# Patient Record
Sex: Female | Born: 1975 | Race: White | Hispanic: No | Smoking: Former smoker
Health system: Southern US, Community
[De-identification: ages and names within clinical notes are randomized; demographics above are authoritative.]

## PROBLEM LIST (undated history)

## (undated) DIAGNOSIS — F32A Depression, unspecified: Secondary | ICD-10-CM

## (undated) DIAGNOSIS — T8859XA Other complications of anesthesia, initial encounter: Secondary | ICD-10-CM

## (undated) DIAGNOSIS — J302 Other seasonal allergic rhinitis: Secondary | ICD-10-CM

## (undated) DIAGNOSIS — J45909 Unspecified asthma, uncomplicated: Secondary | ICD-10-CM

## (undated) DIAGNOSIS — T7840XA Allergy, unspecified, initial encounter: Secondary | ICD-10-CM

## (undated) DIAGNOSIS — Z8489 Family history of other specified conditions: Secondary | ICD-10-CM

## (undated) DIAGNOSIS — M1990 Unspecified osteoarthritis, unspecified site: Secondary | ICD-10-CM

## (undated) DIAGNOSIS — R519 Headache, unspecified: Secondary | ICD-10-CM

## (undated) DIAGNOSIS — F419 Anxiety disorder, unspecified: Secondary | ICD-10-CM

## (undated) DIAGNOSIS — F329 Major depressive disorder, single episode, unspecified: Secondary | ICD-10-CM

## (undated) DIAGNOSIS — G629 Polyneuropathy, unspecified: Secondary | ICD-10-CM

## (undated) HISTORY — PX: AUGMENTATION MAMMAPLASTY: SUR837

## (undated) HISTORY — PX: KNEE SURGERY: SHX244

## (undated) HISTORY — PX: ESOPHAGOGASTRODUODENOSCOPY: SHX1529

## (undated) HISTORY — DX: Unspecified osteoarthritis, unspecified site: M19.90

## (undated) HISTORY — DX: Depression, unspecified: F32.A

## (undated) HISTORY — DX: Allergy, unspecified, initial encounter: T78.40XA

## (undated) HISTORY — PX: BREAST SURGERY: SHX581

## (undated) HISTORY — DX: Major depressive disorder, single episode, unspecified: F32.9

## (undated) HISTORY — DX: Unspecified asthma, uncomplicated: J45.909

---

## 2004-04-10 ENCOUNTER — Ambulatory Visit (HOSPITAL_COMMUNITY): Admission: RE | Admit: 2004-04-10 | Discharge: 2004-04-10 | Payer: Self-pay | Admitting: Specialist

## 2004-04-17 ENCOUNTER — Encounter: Admission: RE | Admit: 2004-04-17 | Discharge: 2004-04-17 | Payer: Self-pay | Admitting: Specialist

## 2006-09-25 ENCOUNTER — Emergency Department (HOSPITAL_COMMUNITY): Admission: EM | Admit: 2006-09-25 | Discharge: 2006-09-25 | Payer: Self-pay | Admitting: Emergency Medicine

## 2008-12-21 ENCOUNTER — Inpatient Hospital Stay (HOSPITAL_COMMUNITY): Admission: AD | Admit: 2008-12-21 | Discharge: 2008-12-21 | Payer: Self-pay | Admitting: Obstetrics & Gynecology

## 2010-02-05 ENCOUNTER — Encounter: Admission: RE | Admit: 2010-02-05 | Discharge: 2010-03-07 | Payer: Self-pay | Admitting: Obstetrics and Gynecology

## 2010-02-05 ENCOUNTER — Ambulatory Visit: Admission: RE | Admit: 2010-02-05 | Discharge: 2010-02-05 | Payer: Self-pay | Admitting: Obstetrics and Gynecology

## 2010-02-08 ENCOUNTER — Ambulatory Visit: Admission: RE | Admit: 2010-02-08 | Discharge: 2010-02-08 | Payer: Self-pay | Admitting: Obstetrics and Gynecology

## 2010-10-07 LAB — URINALYSIS, ROUTINE W REFLEX MICROSCOPIC
Bilirubin Urine: NEGATIVE
Glucose, UA: NEGATIVE mg/dL
Ketones, ur: NEGATIVE mg/dL
Nitrite: NEGATIVE
Protein, ur: NEGATIVE mg/dL
Specific Gravity, Urine: 1.01 (ref 1.005–1.030)
Urobilinogen, UA: 0.2 mg/dL (ref 0.0–1.0)
pH: 7 (ref 5.0–8.0)

## 2010-10-07 LAB — CBC
HCT: 39 % (ref 36.0–46.0)
Hemoglobin: 13.8 g/dL (ref 12.0–15.0)
MCHC: 35.3 g/dL (ref 30.0–36.0)
MCV: 93.9 fL (ref 78.0–100.0)
Platelets: 199 10*3/uL (ref 150–400)
RBC: 4.15 MIL/uL (ref 3.87–5.11)
RDW: 12.6 % (ref 11.5–15.5)
WBC: 7 10*3/uL (ref 4.0–10.5)

## 2010-10-07 LAB — POCT PREGNANCY, URINE: Preg Test, Ur: POSITIVE

## 2010-10-07 LAB — WET PREP, GENITAL
Clue Cells Wet Prep HPF POC: NONE SEEN
Trich, Wet Prep: NONE SEEN
Yeast Wet Prep HPF POC: NONE SEEN

## 2010-10-07 LAB — URINE MICROSCOPIC-ADD ON

## 2010-10-07 LAB — GC/CHLAMYDIA PROBE AMP, GENITAL
Chlamydia, DNA Probe: NEGATIVE
GC Probe Amp, Genital: NEGATIVE

## 2010-10-07 LAB — ABO/RH: ABO/RH(D): O POS

## 2010-11-15 NOTE — Op Note (Signed)
NAMEGRETTA, SAMONS               ACCOUNT NO.:  [id number]   MEDICAL RECORD NO.:  [id number]          PATIENT TYPE:  AMB   LOCATION:  DAY                          FACILITY:  [facility]   PHYSICIAN:  Jene Every, M.D.    DATE OF BIRTH:  1976/03/27   DATE OF PROCEDURE:  04/10/2004  DATE OF DISCHARGE:                                 OPERATIVE REPORT   PREOPERATIVE DIAGNOSIS:  Post-traumatic chondromalacia, patellofemoral  joint, right knee.   POSTOPERATIVE DIAGNOSIS:  Post-traumatic chondromalacia, patellofemoral  joint, right knee.   PROCEDURE PERFORMED:  Right knee arthroscopy, chondroplasty of the patella  and femoral sulcus.   ANESTHESIA:  General.   BRIEF HISTORY/INDICATIONS:  This is a 35 year old female with post-traumatic  injury to the knee and persistent pain in the patellofemoral joint.  Operative intervention is indicated for diagnosis and treatment.  Risks and  benefits were discussed, including bleeding, infection, damage to  vasculature, no change in symptoms, worsening of symptoms, etc.   Obtaining the patient in the supine position after induction of the  appropriate general anesthesia and 1 gm of Kefzol, the right lower extremity  was prepped and draped in the usual sterile fashion.  A lateral parapatellar  portal and superomedial parapatellar portal was fashioned with a #11 blade.  Ingress cannula was atraumatically placed.  Irrigant was utilized to  insufflate the joint.  The camera was then inserted laterally.  Under direct  visualization, a medial parapatellar portal was fashioned with a #11 blade  after localization with an 18 gauge needle, sparing the medial meniscus.  Examination of the patellar pouch reveals some minor grade III changes of  the patella and of the femoral sulcus.  There was normal patellofemoral  tracking.  A 4 inch resector was introduced, utilized to perform a  chondroplasty to a stable base.  Examination of the medial compartment  revealed a normal femoral condyle, tibial plateau and meniscus, stable to  probe palpation without evidence of tear.  ACL and PCL were unremarkable.  The lateral compartment revealed a normal femoral condyle and tibial plateau  and meniscus stable to palpation without evidence of tear or chondral  defect.  The gutters were unremarkable.  Copious lavage of the knee.  Examined the entire knee with no evidence of further pathology to minimal  arthroscopic intervention.   Next, all instrumentation was removed.  Portals were closed with 4-0 nylon  simple interrupted sutures.  Marcaine 0.25% with epinephrine was infiltrated  into the joint.  The wound was dressed sterilely.  She was awakened without  difficulty and transported to the recovery room in satisfactory condition.  Patient tolerated the procedure well with no complications.     Trey Paula   JB/MEDQ  D:  04/10/2004  T:  04/10/2004  Job:  [id number]

## 2012-06-16 ENCOUNTER — Ambulatory Visit (INDEPENDENT_AMBULATORY_CARE_PROVIDER_SITE_OTHER): Payer: 59 | Admitting: Family Medicine

## 2012-06-16 VITALS — BP 118/74 | HR 91 | Temp 98.4°F | Resp 16 | Ht 66.0 in | Wt 140.0 lb

## 2012-06-16 DIAGNOSIS — F411 Generalized anxiety disorder: Secondary | ICD-10-CM

## 2012-06-16 DIAGNOSIS — F419 Anxiety disorder, unspecified: Secondary | ICD-10-CM

## 2012-06-16 MED ORDER — ALPRAZOLAM 0.25 MG PO TABS: 0.2500 mg | ORAL_TABLET | Freq: Two times a day (BID) | ORAL | Status: DC | PRN

## 2012-06-16 NOTE — Progress Notes (Signed)
[facility] [address] [place] [address] [phone]  Date:  06/16/2012   Name:  Ann Sampson   DOB:  07/30/1975   MRN:  [id number]  PCP:  No primary provider on file.    Chief Complaint: Anxiety   History of Present Illness:  Ann Sampson is a 36 y.o. very pleasant female patient who presents with the following:  Here as a new patient today.  She would like to receive medication for symptoms of depression and anxiety.  Her job is stressful- this is not new.  However, her father is also now ill- he started to have TIAs earlier this month but during his evaluation he was found to have stage 4 lung cancer.  He recently had surgery but is not doing very well She is having frequent crying spells, and feels sad.   She is sleeping well.   Appetite is ok.    She is generally healthy except for a history of IBS and GERD- however these issues are now stable and she is off meds.   She has also been off antidepressants for several years.  She took wellbutrin and something else a long time ago- however, she felt that the antidepressants made her feel out of it and she is not eager to use these again.    No SI or HI  She is married and has a 34 year old son.  Has a glass of wine at night frequently  There is no problem list on file for this patient.   Past Medical History  Diagnosis Date  . Allergy   . Depression   . Asthma   . Arthritis     Past Surgical History  Procedure Date  . Breast surgery   . Knee surgery     History  Substance Use Topics  . Smoking status: Never Smoker   . Smokeless tobacco: Never Used  . Alcohol Use: No    Family History  Problem Relation Age of Onset  . Asthma Son     Allergies  Allergen Reactions  . Bextra (Valdecoxib)   . Flagyl (Metronidazole)   . Vioxx (Rofecoxib)     Medication list has been reviewed and updated.  No current outpatient prescriptions on file prior to visit.    Review of  Systems:  As per HPI- otherwise negative.   Physical Examination: Filed Vitals:   06/16/12 1604  BP: 118/74  Pulse: 91  Temp: 98.4 F (36.9 C)  Resp: 16   Filed Vitals:   06/16/12 1604  Height: 5\' 6"  (1.676 m)  Weight: 140 lb (63.504 kg)   Body mass index is 22.60 kg/(m^2). Ideal Body Weight: Weight in (lb) to have BMI = 25: 154.6   GEN: WDWN, NAD, Non-toxic, A & O x 3 HEENT: Atraumatic, Normocephalic. Neck supple. No masses, No LAD. intermittently tearful during exam Ears and Nose: No external deformity. CV: RRR, No M/G/R. No JVD. No thrill. No extra heart sounds. PULM: CTA B, no wheezes, crackles, rhonchi. No retractions. No resp. distress. No accessory muscle use. EXTR: No c/c/e NEURO Normal gait.  PSYCH: Normally interactive. Conversant and appropriate, no hallucinations or pressured speech   Assessment and Plan: 1. Anxiety  ALPRAZolam (XANAX) 0.25 MG tablet   Gave a limited supply of alprazolam to use as needed.  She does not want to use an antidepressant at this time- we will try the xanax first.  However she does understand that if she  needs to use this medication regularly that adding an antidepressant will be the next step.  She will give me a call or email in the next couple of weeks with an update- Sooner if worse.   Avoid using xanax with alcohol or when driving.    Abbe Amsterdam, MD

## 2012-06-16 NOTE — Patient Instructions (Addendum)
Use the xanax as needed but be cautious of sedation. Please give me a call or email in a week or two and let me know how you are doing!

## 2013-08-18 ENCOUNTER — Encounter: Payer: Self-pay | Admitting: Family Medicine

## 2013-08-18 ENCOUNTER — Ambulatory Visit (INDEPENDENT_AMBULATORY_CARE_PROVIDER_SITE_OTHER): Payer: 59 | Admitting: Family Medicine

## 2013-08-18 VITALS — BP 110/60 | HR 88 | Temp 98.5°F | Resp 16 | Ht 64.5 in | Wt 164.0 lb

## 2013-08-18 DIAGNOSIS — L853 Xerosis cutis: Secondary | ICD-10-CM

## 2013-08-18 DIAGNOSIS — M79675 Pain in left toe(s): Secondary | ICD-10-CM

## 2013-08-18 DIAGNOSIS — L539 Erythematous condition, unspecified: Secondary | ICD-10-CM

## 2013-08-18 DIAGNOSIS — L259 Unspecified contact dermatitis, unspecified cause: Secondary | ICD-10-CM

## 2013-08-18 DIAGNOSIS — L258 Unspecified contact dermatitis due to other agents: Secondary | ICD-10-CM

## 2013-08-18 DIAGNOSIS — M79609 Pain in unspecified limb: Secondary | ICD-10-CM

## 2013-08-18 DIAGNOSIS — M79674 Pain in right toe(s): Secondary | ICD-10-CM

## 2013-08-18 DIAGNOSIS — L309 Dermatitis, unspecified: Secondary | ICD-10-CM

## 2013-08-18 LAB — COMPREHENSIVE METABOLIC PANEL
ALT: 13 U/L (ref 0–35)
AST: 19 U/L (ref 0–37)
Albumin: 4.8 g/dL (ref 3.5–5.2)
Alkaline Phosphatase: 38 U/L — ABNORMAL LOW (ref 39–117)
BUN: 8 mg/dL (ref 6–23)
CO2: 28 mEq/L (ref 19–32)
Calcium: 9.7 mg/dL (ref 8.4–10.5)
Chloride: 102 mEq/L (ref 96–112)
Creat: 0.74 mg/dL (ref 0.50–1.10)
Glucose, Bld: 98 mg/dL (ref 70–99)
Potassium: 4.4 mEq/L (ref 3.5–5.3)
Sodium: 142 mEq/L (ref 135–145)
Total Bilirubin: 0.6 mg/dL (ref 0.2–1.2)
Total Protein: 6.9 g/dL (ref 6.0–8.3)

## 2013-08-18 LAB — POCT CBC
Granulocyte percent: 57.2 %G (ref 37–80)
HCT, POC: 45.9 % (ref 37.7–47.9)
Hemoglobin: 14.7 g/dL (ref 12.2–16.2)
Lymph, poc: 1.4 (ref 0.6–3.4)
MCH, POC: 30.7 pg (ref 27–31.2)
MCHC: 32 g/dL (ref 31.8–35.4)
MCV: 95.9 fL (ref 80–97)
MID (cbc): 0.3 (ref 0–0.9)
MPV: 9.7 fL (ref 0–99.8)
POC Granulocyte: 2.2 (ref 2–6.9)
POC LYMPH PERCENT: 35.5 %L (ref 10–50)
POC MID %: 7.3 %M (ref 0–12)
Platelet Count, POC: 243 10*3/uL (ref 142–424)
RBC: 4.79 M/uL (ref 4.04–5.48)
RDW, POC: 13.2 %
WBC: 3.9 10*3/uL — AB (ref 4.6–10.2)

## 2013-08-18 LAB — RHEUMATOID FACTOR: Rheumatoid fact SerPl-aCnc: <10 IU/mL (ref <=14)

## 2013-08-18 LAB — TSH: TSH: 1.939 u[IU]/mL (ref 0.350–4.500)

## 2013-08-18 LAB — URIC ACID: Uric Acid, Serum: 4.8 mg/dL (ref 2.4–7.0)

## 2013-08-18 LAB — POCT SEDIMENTATION RATE: POCT SED RATE: 15 mm/hr (ref 0–22)

## 2013-08-18 MED ORDER — DICLOFENAC SODIUM 75 MG PO TBEC: 75.0000 mg | DELAYED_RELEASE_TABLET | Freq: Two times a day (BID) | ORAL | Status: DC

## 2013-08-18 MED ORDER — TRIAMCINOLONE ACETONIDE 0.1 % EX CREA: 1.0000 "application " | TOPICAL_CREAM | Freq: Two times a day (BID) | CUTANEOUS | Status: DC

## 2013-08-18 NOTE — Patient Instructions (Signed)
Bathe less frequently and use a moisturizing soap such as dove  Pat dry after bathing, and apply a small amount of mineral oil over entire body  Use triamcinolone cream on rash twice daily  Return if not improving.  Take diclofenac 75 twice daily for 2 weeks for inflammation of toes  Labs are pending.  If not improving next 2 weeks may need to refer to rheumatologist

## 2013-08-18 NOTE — Progress Notes (Signed)
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 For 6 months or so she has been having problems with redness and swelling and pain in her third toes of both feet. The right foot especially is bothering her. He gets very tender down at the tip of the toes. She has had no known trauma. She wears comfortable shoes to the best her ability.  She also has a history of some eczema in the past. She's been having a rash on her arms, primarily in the left arm at the antecubital fossa. It has gotten worse lately. Not much itching.  Objective: Very dry skin generally. Even though she used cream on her hands this morning there flaky looking. Her left arm just above and into the elbow at the antecubital fossa is erythematous, no crusting, a confluent but irregular bordered area. Small amount of similar rash on the right arm. None seen on her trunk. Both feet and weak pulses in the dorsalis pedis and posterior tibialis, but it is extremely cold this morning and her feet are very cold. The third toe of both feet is red and get the toe surrounding the nail on the dorsal surface of the DIP joint and distally. The right is especially tender to touch, the left a little bit. It has look okay except for being cold and slightly bluish this morning.  Assessment: Erythema and swelling of toes with toe pain bilaterally third toes Eczematoid dermatitis on arm Dry skin eczema  Plan: Check labs Triamcinolone cream twice a day on rash Diclofenac twice daily for toes. Labs are pending. She has some allergies, but can take ibuprofen and naproxen so I suspect she can take the diclofenac well but cautioned her to stop it immediately and the case will rash or problems

## 2013-08-19 ENCOUNTER — Encounter: Payer: Self-pay | Admitting: Family Medicine

## 2013-08-19 LAB — ANA: Anti Nuclear Antibody(ANA): NEGATIVE

## 2013-08-23 ENCOUNTER — Ambulatory Visit: Payer: 59

## 2013-08-23 ENCOUNTER — Ambulatory Visit (INDEPENDENT_AMBULATORY_CARE_PROVIDER_SITE_OTHER): Payer: 59 | Admitting: Family Medicine

## 2013-08-23 ENCOUNTER — Telehealth: Payer: Self-pay

## 2013-08-23 VITALS — BP 126/80 | HR 89 | Temp 98.4°F | Resp 16 | Ht 64.5 in | Wt 163.2 lb

## 2013-08-23 DIAGNOSIS — M545 Low back pain, unspecified: Secondary | ICD-10-CM

## 2013-08-23 DIAGNOSIS — R202 Paresthesia of skin: Secondary | ICD-10-CM

## 2013-08-23 DIAGNOSIS — R230 Cyanosis: Secondary | ICD-10-CM

## 2013-08-23 DIAGNOSIS — R200 Anesthesia of skin: Secondary | ICD-10-CM

## 2013-08-23 DIAGNOSIS — M79676 Pain in unspecified toe(s): Secondary | ICD-10-CM

## 2013-08-23 DIAGNOSIS — R209 Unspecified disturbances of skin sensation: Secondary | ICD-10-CM

## 2013-08-23 DIAGNOSIS — M7989 Other specified soft tissue disorders: Secondary | ICD-10-CM

## 2013-08-23 DIAGNOSIS — M79609 Pain in unspecified limb: Secondary | ICD-10-CM

## 2013-08-23 MED ORDER — GABAPENTIN 300 MG PO CAPS: ORAL_CAPSULE | ORAL | Status: DC

## 2013-08-23 NOTE — Patient Instructions (Signed)
Can start Neurontin as discussed. We will refer you to a rheumatologist, but if any worsening of your back pain or other pains prior - recheck with me or Dr. Alwyn RenHopper.  We will also schedule the evaluation of circulation in legs (ABI test) -and will call you about this.

## 2013-08-23 NOTE — Progress Notes (Addendum)
Subjective:    Patient ID: Ann Sampson, female    DOB: 05/31/1976, 38 y.o.   MRN: [id number] This chart was scribed for Meredith StaggersJeffrey Sharonda Llamas, MD  by Danella Maiersaroline Early, ED Scribe. This patient was seen in room 11 and the patient's care was started at 7:25 PM.  Chief Complaint  Patient presents with  . Back Pain    low back pain x2 days  . Numbness    and feels like pins sticking her on her toes.  was just here on the 19th for toe pain    HPI HPI Comments: Ann LarkRhonda M Sampson is a 38 y.o. female who [facility] to the [facility] complaining of lower back pain. Seen 5 days ago by Dr Alwyn RenHopper for reported redness and swelling of the toes with underlying eczema. Prescribed triamcinoline and diclofinac for the pain in her toes. Had a normal Cedrate of 15. TSH normal. Uric acid normal. ANA negative. Rheumatoid factor negative. CMP normal except borderline low AP.   Pt is complaining of intermittent, non-radiating lower back pain described as pressure onset 5 days ago that worsened in the last 2 days with numbness and discoloration in the toes. She states the pain worsened when her son jumped on her to give a hug. She reports several incidents where she almost fell walking down the stairs since she cannot feel her toes. She describes the numbness as pins and needles. She reports intermittent numbness in her fingers but no pain. She denies perioral numbness. She reports intermittent, worsening pain, redness, and swelling in her toes since May 2014. She states it is tender to touch and hurts to walk. She states it starts out as a small red dot on the toes that spreads to whole toes. She states she has been verry worried about her feet. She has not been sleeping well due to worry, worse in the last 2 days. She states the numbness does not keep her up at night but the pressure in her back sometimes wakes her up. She denies palpitations, sweats, fevers, chills, weakness, urinary or bowel incontinence,  genital numbness. She reports intermittent headaches lasting minutes at a time. She reports reduced swelling in the toes after taking the diclofinac.  PCP - No primary provider on file.   Results for orders placed in visit on 08/18/13  COMPREHENSIVE METABOLIC PANEL      Result Value Ref Range   Sodium 142  135 - 145 mEq/L   Potassium 4.4  3.5 - 5.3 mEq/L   Chloride 102  96 - 112 mEq/L   CO2 28  19 - 32 mEq/L   Glucose, Bld 98  70 - 99 mg/dL   BUN 8  6 - 23 mg/dL   Creat 4.090.74  8.110.50 - 9.141.10 mg/dL   Total Bilirubin 0.6  0.2 - 1.2 mg/dL   Alkaline Phosphatase 38 (*) 39 - 117 U/L   AST 19  0 - 37 U/L   ALT 13  0 - 35 U/L   Total Protein 6.9  6.0 - 8.3 g/dL   Albumin 4.8  3.5 - 5.2 g/dL   Calcium 9.7  8.4 - 78.210.5 mg/dL  TSH      Result Value Ref Range   TSH 1.939  0.350 - 4.500 uIU/mL  URIC ACID      Result Value Ref Range   Uric Acid, Serum 4.8  2.4 - 7.0 mg/dL  ANA      Result Value Ref Range  ANA NEG  NEGATIVE  RHEUMATOID FACTOR      Result Value Ref Range   Rheumatoid Factor <10  <=14 IU/mL  POCT CBC      Result Value Ref Range   WBC 3.9 (*) 4.6 - 10.2 K/uL   Lymph, poc 1.4  0.6 - 3.4   POC LYMPH PERCENT 35.5  10 - 50 %L   MID (cbc) 0.3  0 - 0.9   POC MID % 7.3  0 - 12 %M   POC Granulocyte 2.2  2 - 6.9   Granulocyte percent 57.2  37 - 80 %G   RBC 4.79  4.04 - 5.48 M/uL   Hemoglobin 14.7  12.2 - 16.2 g/dL   HCT, POC 16.1  09.6 - 47.9 %   MCV 95.9  80 - 97 fL   MCH, POC 30.7  27 - 31.2 pg   MCHC 32.0  31.8 - 35.4 g/dL   RDW, POC 04.5     Platelet Count, POC 243  142 - 424 K/uL   MPV 9.7  0 - 99.8 fL  POCT SEDIMENTATION RATE      Result Value Ref Range   POCT SED RATE 15  0 - 22 mm/hr    There are no active problems to display for this patient.  Past Medical History  Diagnosis Date  . Allergy   . Depression   . Asthma   . Arthritis    Past Surgical History  Procedure Laterality Date  . Breast surgery    . Knee surgery     Allergies  Allergen Reactions    . Bextra [Valdecoxib]   . Flagyl [Metronidazole]   . Prednisone     Anaphylaxis   . Vioxx [Rofecoxib]    Prior to Admission medications   Medication Sig Start Date End Date Taking? Authorizing Provider  diclofenac (VOLTAREN) 75 MG EC tablet Take 1 tablet (75 mg total) by mouth 2 (two) times daily. 08/18/13  Yes Peyton Najjar, MD  EPINEPHrine (EPI-PEN) 0.3 mg/0.3 mL DEVI Inject into the muscle once.   Yes Historical Provider, MD  Multiple Vitamins-Minerals (ADULT GUMMY PO) Take by mouth.   Yes Historical Provider, MD  ALPRAZolam (XANAX) 0.25 MG tablet Take 1 tablet (0.25 mg total) by mouth 2 (two) times daily as needed for anxiety. 06/16/12   Gwenlyn Found Copland, MD  triamcinolone cream (KENALOG) 0.1 % Apply 1 application topically 2 (two) times daily. 08/18/13   Peyton Najjar, MD   History  Substance Use Topics  . Smoking status: Never Smoker   . Smokeless tobacco: Never Used  . Alcohol Use: No      Review of Systems  Constitutional: Negative for fever, chills and diaphoresis.  Eyes: Negative for visual disturbance.  Cardiovascular: Negative for palpitations and leg swelling.  Genitourinary: Positive for vaginal pain.  Musculoskeletal: Positive for back pain.  Skin: Positive for color change (cyanosis of toes).  Neurological: Positive for numbness and headaches.       Objective:   Physical Exam  Nursing note and vitals reviewed. Constitutional: She is oriented to person, place, and time. She appears well-developed and well-nourished. No distress.  HENT:  Head: Normocephalic and atraumatic.  Eyes: EOM are normal.  Neck: Neck supple.  Cardiovascular: Normal rate.   Pulmonary/Chest: Effort normal. No respiratory distress.  Musculoskeletal: Normal range of motion.  DP pulse 1+ bilaterally. Right foot slight cyanosis of all distal toes, most notable in great toe and 3rd toe. Left foot slight  cyanosis to all toes, most notable in 4th toe. Cap refill less than 1 sec. Full ROM  described area of pain in the lower paraspinals but no focal tenderness. Able to heel and toe walk without difficulty.  Neurological: She is alert and oriented to person, place, and time.  Reflex Scores:      Patellar reflexes are 2+ on the right side and 2+ on the left side.      Achilles reflexes are 2+ on the right side and 2+ on the left side. Skin: Skin is warm and dry.  Psychiatric: She has a normal mood and affect. Her behavior is normal.    Filed Vitals:   08/23/13 1849  BP: 126/80  Pulse: 89  Temp: 98.4 F (36.9 C)  TempSrc: Oral  Resp: 16  Height: 5' 4.5" (1.638 m)  Weight: 163 lb 3.2 oz (74.027 kg)  SpO2: 98%    [facility] preliminary x-ray report read by Dr. Neva Seat:  LS spine: NAD.      Assessment & Plan:   Ann Sampson is a 37 y.o. female Low back pain - Plan: DG Lumbar Spine Complete, Ambulatory referral to Rheumatology  Paresthesia of both feet - Plan: DG Lumbar Spine Complete, Ankle brachial index, Ambulatory referral to Rheumatology, gabapentin (NEURONTIN) 300 MG capsule, DISCONTINUED: gabapentin (NEURONTIN) 300 MG capsule  Toe cyanosis - Plan: Ankle brachial index, Ambulatory referral to Rheumatology  Pain and swelling of toe - Plan: Ambulatory referral to Rheumatology, gabapentin (NEURONTIN) 300 MG capsule, DISCONTINUED: gabapentin (NEURONTIN) 300 MG capsule  Numbness and tingling in hands - Plan: Ambulatory referral to Rheumatology, gabapentin (NEURONTIN) 300 MG capsule, DISCONTINUED: gabapentin (NEURONTIN) 300 MG capsule  Initially toe color changes and dysesthesias, now with LBP and hand paraesthesias. Prior rheumatalogic testing wnl, but possible autoimmune with raynauds syndrome. Most recent back sx's may be simple MSK strain/pain, but XR obtained without focal findings. Trial of Neurontin QHS, then increase to BID after 1 week, refer to Rheumatology, and check ABI's with toe/foot sx's above. RTC after these tests, or sooner if new/worsening sx's.   Meds  ordered this encounter  Medications  . DISCONTD: gabapentin (NEURONTIN) 300 MG capsule    Sig: 1 cap by mouth each night for 1 week, then increase to 1 cap by mouth twice per day.    Dispense:  60 capsule    Refill:  0  . gabapentin (NEURONTIN) 300 MG capsule    Sig: 1 cap by mouth each night for 1 week, then increase to 1 cap by mouth twice per day.    Dispense:  60 capsule    Refill:  0   Patient Instructions  Can start Neurontin as discussed. We will refer you to a rheumatologist, but if any worsening of your back pain or other pains prior - recheck with me or Dr. Alwyn Ren.  We will also schedule the evaluation of circulation in legs (ABI test) -and will call you about this.         I personally performed the services described in this documentation, which was scribed in my presence. The recorded information has been reviewed and considered, and addended by me as needed.

## 2013-08-23 NOTE — Telephone Encounter (Signed)
PT called wanting to know if her lab results were back.  Said since her visit she has had back pain, numbness and tingling in in toes and hand(s).  I told her I would put in a message, but advised her to come back in to be seen.  [phone]

## 2013-08-25 NOTE — Telephone Encounter (Signed)
Pt came into office.

## 2013-09-22 ENCOUNTER — Other Ambulatory Visit: Payer: Self-pay | Admitting: Family Medicine

## 2013-09-22 DIAGNOSIS — R200 Anesthesia of skin: Secondary | ICD-10-CM

## 2013-09-22 DIAGNOSIS — R202 Paresthesia of skin: Secondary | ICD-10-CM

## 2013-09-22 DIAGNOSIS — M79676 Pain in unspecified toe(s): Secondary | ICD-10-CM

## 2013-09-22 DIAGNOSIS — M7989 Other specified soft tissue disorders: Secondary | ICD-10-CM

## 2013-09-22 NOTE — Telephone Encounter (Signed)
Numbness in hands has diminished.   Toe discoloration- resolved.    Not turning red anymore, pain subsided.  Would like a refill on Gabapentin .  Please advise

## 2013-09-22 NOTE — Telephone Encounter (Signed)
Please call pt and check status of her numbness in hands and discoloration in toes.  If this has persisted, and still with bruising or discoloration in toes - recheck to discuss some other possibilities. If darkening/bruised appearance of toes has persisted - there is rarely a condition called "blue toe syndrome" that can be due to cholesterol crystals/deposition in the tissue, or small clots in those areas, and would consider this if sx's have persisted. Let me know if she has questions and provide her my hours if needed for follow up. Thanks, [employer]

## 2013-09-23 MED ORDER — GABAPENTIN 300 MG PO CAPS: ORAL_CAPSULE | ORAL | Status: DC

## 2013-09-23 NOTE — Telephone Encounter (Signed)
[facility]. Sarah, did Dr Neva SeatGreene speak with you about the ABI test? I didn't call pt about the Rx because I didn't know where things stood w/the referral?

## 2013-09-23 NOTE — Telephone Encounter (Signed)
Pt went to specialist and they did not prescribe anything further for pain. Her numbness has subsided. She is making an appt with you for the first week of April. Can we refill the Gabapentin? I have pended medication. She did not feel that she needed to increase the dose to BID. She only takes one cap at night.   She also wants to check on a vascular referral she spoke to you about-ABI test for circulation in the legs. I see the message in the note but not a referral and was unsure what to order. Please advise and I will take care of.

## 2013-09-30 ENCOUNTER — Ambulatory Visit (INDEPENDENT_AMBULATORY_CARE_PROVIDER_SITE_OTHER): Payer: 59 | Admitting: Family Medicine

## 2013-09-30 VITALS — BP 102/70 | HR 81 | Temp 98.1°F | Resp 18 | Ht 64.5 in | Wt 158.0 lb

## 2013-09-30 DIAGNOSIS — M79609 Pain in unspecified limb: Secondary | ICD-10-CM

## 2013-09-30 DIAGNOSIS — R209 Unspecified disturbances of skin sensation: Secondary | ICD-10-CM

## 2013-09-30 DIAGNOSIS — M79674 Pain in right toe(s): Secondary | ICD-10-CM

## 2013-09-30 DIAGNOSIS — M79675 Pain in left toe(s): Principal | ICD-10-CM

## 2013-09-30 DIAGNOSIS — I75029 Atheroembolism of unspecified lower extremity: Secondary | ICD-10-CM

## 2013-09-30 DIAGNOSIS — L539 Erythematous condition, unspecified: Secondary | ICD-10-CM

## 2013-09-30 DIAGNOSIS — R208 Other disturbances of skin sensation: Secondary | ICD-10-CM

## 2013-09-30 DIAGNOSIS — M545 Low back pain, unspecified: Secondary | ICD-10-CM

## 2013-09-30 MED ORDER — DICLOFENAC SODIUM 75 MG PO TBEC: 75.0000 mg | DELAYED_RELEASE_TABLET | Freq: Two times a day (BID) | ORAL | Status: DC

## 2013-09-30 NOTE — Progress Notes (Addendum)
Subjective:  This chart was scribed for Shade Flood, MD by Leone Payor, ED Scribe. This patient was seen in room 12 and the patient's care was started 8:18 AM.    Patient ID: Ann Sampson, female    DOB: 31-Oct-1975, 38 y.o.   MRN: [id number]  HPI  HPI Comments: Last office visit was Feb 24th. She was treated for low back pain, paresthesias in the feet and hands, and noted to have discoloration and swelling of toes. Referred to rheumatology but also started on Neurontin 300 mg QHS. Asked to return for follow up.   Ann Sampson is a 38 y.o. female who presents to [facility] complaining of gradually improving paresthesias to the bilateral hands and feet for the past 1 month. She states her low back pain is gradually improving with the prescribed medications. She was seen by rheumatologist who recommends for patient to have an ABI. She continues to have occasional discoloration to some of her toes. She denies weakness. She denies family history of hypercholesterolemia. She denies any new symptoms such as HA.     There are no active problems to display for this patient.  Past Medical History  Diagnosis Date  . Allergy   . Depression   . Asthma   . Arthritis    Past Surgical History  Procedure Laterality Date  . Breast surgery    . Knee surgery     Allergies  Allergen Reactions  . Bextra [Valdecoxib]   . Flagyl [Metronidazole]   . Prednisone     Anaphylaxis   . Vioxx [Rofecoxib]    Prior to Admission medications   Medication Sig Start Date End Date Taking? Authorizing Provider  ALPRAZolam (XANAX) 0.25 MG tablet Take 1 tablet (0.25 mg total) by mouth 2 (two) times daily as needed for anxiety. 06/16/12  Yes Gwenlyn Found Copland, MD  Ascorbic Acid (VITAMIN C) 100 MG tablet Take 100 mg by mouth daily.   Yes Historical Provider, MD  EPINEPHrine (EPI-PEN) 0.3 mg/0.3 mL DEVI Inject into the muscle once.   Yes Historical Provider, MD  gabapentin (NEURONTIN) 300 MG capsule 1 cap by  mouth each night for 1 week, then increase to 1 cap by mouth twice per day. 09/23/13  Yes Shade Flood, MD  Multiple Vitamins-Minerals (ADULT GUMMY PO) Take by mouth.   Yes Historical Provider, MD  diclofenac (VOLTAREN) 75 MG EC tablet Take 1 tablet (75 mg total) by mouth 2 (two) times daily. 08/18/13   Peyton Najjar, MD  triamcinolone cream (KENALOG) 0.1 % Apply 1 application topically 2 (two) times daily. 08/18/13   Peyton Najjar, MD   History   Social History  . Marital Status: Divorced    Spouse Name: N/A    Number of Children: N/A  . Years of Education: N/A   Occupational History  . Not on file.   Social History Main Topics  . Smoking status: Never Smoker   . Smokeless tobacco: Never Used  . Alcohol Use: No  . Drug Use: No  . Sexual Activity: Not on file   Other Topics Concern  . Not on file   Social History Narrative  . No narrative on file      Review of Systems  Skin: Positive for color change.  Neurological: Negative for weakness and numbness.       Objective:   Physical Exam  Nursing note and vitals reviewed. Constitutional: She is oriented to person, place, and time. She appears well-developed  and well-nourished.  HENT:  Head: Normocephalic and atraumatic.  Eyes: Conjunctivae and EOM are normal. Pupils are equal, round, and reactive to light.  No eyelid lesions or deposits.    Neck: Carotid bruit is not present.  Cardiovascular: Normal rate, regular rhythm, normal heart sounds and intact distal pulses.   Pulses:      Dorsalis pedis pulses are 2+ on the right side, and 2+ on the left side.  Pulmonary/Chest: Effort normal and breath sounds normal. No respiratory distress.  Abdominal: Soft. She exhibits no distension and no pulsatile midline mass. There is no tenderness.  Musculoskeletal: Normal range of motion. She exhibits no edema.  Right 3rd toe has a slight ecchymotic appearance, distally. Same temperature as toe next to it. Cap refill is < 1  seconds. Minimally darkened appearance of the right great toe. Same temperature as the toe next to it. Cap refill < 1 seconds.   Left foot: slightly dark appearance of the left 3rd toe. Same temp as toe next to it. Cap refill is < 1 seconds.   No other areas of cyanosis on feet. No petechiae.  Bilateral hands without any discoloration.   Minimal tenderness to the L-S spine with full ROM.    Neurological: She is alert and oriented to person, place, and time.  Normal sensation distally in the toes.  Skin: Skin is warm and dry. No erythema.  Psychiatric: She has a normal mood and affect. Her behavior is normal.    Filed Vitals:   09/30/13 0758  BP: 102/70  Pulse: 81  Temp: 98.1 F (36.7 C)  TempSrc: Oral  Resp: 18  Height: 5' 4.5" (1.638 m)  Weight: 158 lb (71.668 kg)  SpO2: 98%        Assessment & Plan:   Ann Sampson is a 38 y.o. female Toe pain, bilateral - Plan: Ambulatory referral to Vascular Surgery, diclofenac (VOLTAREN) 75 MG EC tablet  Erythema of toe  Blue toe syndrome - Plan: Ambulatory referral to Vascular Surgery  Dysesthesia  Low back pain   Discoloration of toes - differential includes Raynaud's vs less likely Blue Toe Syndrome. Has had evaluation with Rheumatology and apparently by testing there did not appear to be autoimmune disease. Has had some improvement with warmer weather indicating more likely Raynaud's but will refer to vascular surgery for eval.   Low back pain with paresthesias improved. Continue neurontin for now and recheck in next 6 weeks. Advised of any new neurological symptoms to let me know as may need neurology eval. Continue Voltaren BID PRN as tolerated prior and neurontin same dose.   Meds ordered this encounter  Medications  . Ascorbic Acid (VITAMIN C) 100 MG tablet    Sig: Take 100 mg by mouth daily.  . diclofenac (VOLTAREN) 75 MG EC tablet    Sig: Take 1 tablet (75 mg total) by mouth 2 (two) times daily.    Dispense:  30  tablet    Refill:  0   Patient Instructions  Ok to continue neurontin once per day, and voltaren if needed. Will refer you to vascular surgeon to discuss possible "blue toe syndrome" but " Raynauds" phenomenon still possible. Follow up with me in about 6 weeks to recheck - sooner if worse. (Return to the clinic or go to the nearest emergency room if any of your symptoms worsen or new symptoms occur).   Raynaud's Syndrome Raynaud's Syndrome is a disorder of the blood vessels in your hands and feet. It  occurs when small arteries of the arms/hands or legs/feet become sensitive to cold or emotional upset. This causes the arteries to constrict, or narrow, and reduces blood flow to the area. The color in the fingers or toes changes from white to bluish to red and this is not usually painful. There may be numbness and tingling. Sores on the skin (ulcers) can form. Symptoms are usually relieved by warming. HOME CARE INSTRUCTIONS   Avoid exposure to cold. Keep your whole body warm and dry. Dress in layers. Wear mittens or gloves when handling ice or frozen food and when outdoors. Use holders for glasses or cans containing cold drinks. If possible, stay indoors during cold weather.  Limit your use of caffeine. Switch to decaffeinated coffee, tea, and soda pop. Avoid chocolate.  Avoid smoking or being around cigarette smoke. Smoke will make symptoms worse.  Wear loose fitting socks and comfortable, roomy shoes.  Avoid vibrating tools and machinery.  If possible, avoid stressful and emotional situations. Exercise, meditation and yoga may help you cope with stress. Biofeedback may be useful.  Ask your caregiver about medicine (calcium channel blockers) that may control Raynaud's phenomena. SEEK MEDICAL CARE IF:   Your discomfort becomes worse, despite conservative treatment.  You develop sores on your fingers and toes that do not heal. Document Released: 06/13/2000 Document Revised: 09/08/2011  Document Reviewed: 06/20/2008 [facility] Patient Information 2014 [place], [place].

## 2013-09-30 NOTE — Patient Instructions (Signed)
Ok to continue neurontin once per day, and voltaren if needed. Will refer you to vascular surgeon to discuss possible "blue toe syndrome" but " Raynauds" phenomenon still possible. Follow up with me in about 6 weeks to recheck - sooner if worse. (Return to the clinic or go to the nearest emergency room if any of your symptoms worsen or new symptoms occur).   Raynaud's Syndrome Raynaud's Syndrome is a disorder of the blood vessels in your hands and feet. It occurs when small arteries of the arms/hands or legs/feet become sensitive to cold or emotional upset. This causes the arteries to constrict, or narrow, and reduces blood flow to the area. The color in the fingers or toes changes from white to bluish to red and this is not usually painful. There may be numbness and tingling. Sores on the skin (ulcers) can form. Symptoms are usually relieved by warming. HOME CARE INSTRUCTIONS   Avoid exposure to cold. Keep your whole body warm and dry. Dress in layers. Wear mittens or gloves when handling ice or frozen food and when outdoors. Use holders for glasses or cans containing cold drinks. If possible, stay indoors during cold weather.  Limit your use of caffeine. Switch to decaffeinated coffee, tea, and soda pop. Avoid chocolate.  Avoid smoking or being around cigarette smoke. Smoke will make symptoms worse.  Wear loose fitting socks and comfortable, roomy shoes.  Avoid vibrating tools and machinery.  If possible, avoid stressful and emotional situations. Exercise, meditation and yoga may help you cope with stress. Biofeedback may be useful.  Ask your caregiver about medicine (calcium channel blockers) that may control Raynaud's phenomena. SEEK MEDICAL CARE IF:   Your discomfort becomes worse, despite conservative treatment.  You develop sores on your fingers and toes that do not heal. Document Released: 06/13/2000 Document Revised: 09/08/2011 Document Reviewed: 06/20/2008 [facility]ExitCare Patient  Information 2014 [place]ExitCare, [place].

## 2013-10-13 NOTE — Telephone Encounter (Signed)
Dr. Neva SeatGreene- please advise on the ABI testing. Refill has been sent to the pharmacy.

## 2013-10-14 NOTE — Telephone Encounter (Signed)
Should be seeing vascular surgery - this procedure can be done at their office if needed. No specific order needed at this point. Let me know if there are further questions. Thanks, JG.

## 2014-05-23 ENCOUNTER — Other Ambulatory Visit: Payer: Self-pay | Admitting: Family Medicine

## 2014-07-10 ENCOUNTER — Ambulatory Visit (INDEPENDENT_AMBULATORY_CARE_PROVIDER_SITE_OTHER): Payer: 59 | Admitting: Family Medicine

## 2014-07-10 ENCOUNTER — Encounter: Payer: Self-pay | Admitting: Family Medicine

## 2014-07-10 VITALS — BP 126/78 | HR 92 | Temp 97.8°F | Resp 16 | Ht 65.0 in | Wt 164.0 lb

## 2014-07-10 DIAGNOSIS — Z91030 Bee allergy status: Secondary | ICD-10-CM

## 2014-07-10 DIAGNOSIS — F419 Anxiety disorder, unspecified: Secondary | ICD-10-CM

## 2014-07-10 DIAGNOSIS — R748 Abnormal levels of other serum enzymes: Secondary | ICD-10-CM

## 2014-07-10 DIAGNOSIS — D72819 Decreased white blood cell count, unspecified: Secondary | ICD-10-CM

## 2014-07-10 DIAGNOSIS — R230 Cyanosis: Secondary | ICD-10-CM

## 2014-07-10 DIAGNOSIS — Z91038 Other insect allergy status: Secondary | ICD-10-CM

## 2014-07-10 DIAGNOSIS — R0789 Other chest pain: Secondary | ICD-10-CM

## 2014-07-10 DIAGNOSIS — R208 Other disturbances of skin sensation: Secondary | ICD-10-CM

## 2014-07-10 LAB — COMPLETE METABOLIC PANEL WITH GFR
ALT: 17 U/L (ref 0–35)
AST: 22 U/L (ref 0–37)
Albumin: 4.6 g/dL (ref 3.5–5.2)
Alkaline Phosphatase: 45 U/L (ref 39–117)
BUN: 9 mg/dL (ref 6–23)
CO2: 30 mEq/L (ref 19–32)
Calcium: 10.1 mg/dL (ref 8.4–10.5)
Chloride: 100 mEq/L (ref 96–112)
Creat: 0.68 mg/dL (ref 0.50–1.10)
GFR, Est African American: >89 mL/min
GFR, Est Non African American: >89 mL/min
Glucose, Bld: 94 mg/dL (ref 70–99)
Potassium: 4.1 mEq/L (ref 3.5–5.3)
Sodium: 139 mEq/L (ref 135–145)
Total Bilirubin: 0.3 mg/dL (ref 0.2–1.2)
Total Protein: 7.3 g/dL (ref 6.0–8.3)

## 2014-07-10 LAB — CBC
HCT: 45.1 % (ref 36.0–46.0)
Hemoglobin: 15.3 g/dL — ABNORMAL HIGH (ref 12.0–15.0)
MCH: 30.4 pg (ref 26.0–34.0)
MCHC: 33.9 g/dL (ref 30.0–36.0)
MCV: 89.5 fL (ref 78.0–100.0)
MPV: 10.4 fL (ref 8.6–12.4)
Platelets: 285 10*3/uL (ref 150–400)
RBC: 5.04 MIL/uL (ref 3.87–5.11)
RDW: 12.9 % (ref 11.5–15.5)
WBC: 7 10*3/uL (ref 4.0–10.5)

## 2014-07-10 LAB — TSH: TSH: 1.643 u[IU]/mL (ref 0.350–4.500)

## 2014-07-10 MED ORDER — FLUOXETINE HCL 20 MG PO TABS: 20.0000 mg | ORAL_TABLET | Freq: Every day | ORAL | Status: DC

## 2014-07-10 MED ORDER — EPINEPHRINE 0.3 MG/0.3ML IJ SOAJ: 0.3000 mg | Freq: Once | INTRAMUSCULAR | Status: DC

## 2014-07-10 MED ORDER — ALPRAZOLAM 0.25 MG PO TABS: 0.2500 mg | ORAL_TABLET | Freq: Two times a day (BID) | ORAL | Status: DC | PRN

## 2014-07-10 NOTE — Patient Instructions (Addendum)
We are starting you on prozac which will be 10 mg or 1/2 tablet for 1 week, then you will increase to 38m.  We would like you to return in 4-6 weeks, so that we can see how the medication is doing and decide whether to increase the dosage.   We are also prescribing xanax .250mtablet which you can take twice daily as needed.  The prozac, an SSRI, takes about 4-6 weeks to take into effect.  Increase your exercise to no less than 5x/week.  Try to get in at least 30 minutes of aerobic movement, getting the heart pumping.   -Contact Dr. GrCarlota Raspberryf you decide that you would like counseling.      Stress and Stress Management Stress is a normal reaction to life events. It is what you feel when life demands more than you are used to or more than you can handle. Some stress can be useful. For example, the stress reaction can help you catch the last bus of the day, study for a test, or meet a deadline at work. But stress that occurs too often or for too long can cause problems. It can affect your emotional health and interfere with relationships and normal daily activities. Too much stress can weaken your immune system and increase your risk for physical illness. If you already have a medical problem, stress can make it worse. CAUSES  All sorts of life events may cause stress. An event that causes stress for one person may not be stressful for another person. Major life events commonly cause stress. These may be positive or negative. Examples include losing your job, moving into a new home, getting married, having a baby, or losing a loved one. Less obvious life events may also cause stress, especially if they occur day after day or in combination. Examples include working long hours, driving in traffic, caring for children, being in debt, or being in a difficult relationship. SIGNS AND SYMPTOMS Stress may cause emotional symptoms including, the following:  Anxiety. This is feeling worried, afraid, on edge,  overwhelmed, or out of control.  Anger. This is feeling irritated or impatient.  Depression. This is feeling sad, down, helpless, or guilty.  Difficulty focusing, remembering, or making decisions. Stress may cause physical symptoms, including the following:   Aches and pains. These may affect your head, neck, back, stomach, or other areas of your body.  Tight muscles or clenched jaw.  Low energy or trouble sleeping. Stress may cause unhealthy behaviors, including the following:   Eating to feel better (overeating) or skipping meals.  Sleeping too little, too much, or both.  Working too much or putting off tasks (procrastination).  Smoking, drinking alcohol, or using drugs to feel better. DIAGNOSIS  Stress is diagnosed through an assessment by your health care provider. Your health care provider will ask questions about your symptoms and any stressful life events.Your health care provider will also ask about your medical history and may order blood tests or other tests. Certain medical conditions and medicine can cause physical symptoms similar to stress. Mental illness can cause emotional symptoms and unhealthy behaviors similar to stress. Your health care provider may refer you to a mental health professional for further evaluation.  TREATMENT  Stress management is the recommended treatment for stress.The goals of stress management are reducing stressful life events and coping with stress in healthy ways.  Techniques for reducing stressful life events include the following:  Stress identification. Self-monitor for stress and identify what  causes stress for you. These skills may help you to avoid some stressful events.  Time management. Set your priorities, keep a calendar of events, and learn to say "no." These tools can help you avoid making too many commitments. Techniques for coping with stress include the following:  Rethinking the problem. Try to think realistically about  stressful events rather than ignoring them or overreacting. Try to find the positives in a stressful situation rather than focusing on the negatives.  Exercise. Physical exercise can release both physical and emotional tension. The key is to find a form of exercise you enjoy and do it regularly.  Relaxation techniques. These relax the body and mind. Examples include yoga, meditation, tai chi, biofeedback, deep breathing, progressive muscle relaxation, listening to music, being out in nature, journaling, and other hobbies. Again, the key is to find one or more that you enjoy and can do regularly.  Healthy lifestyle. Eat a balanced diet, get plenty of sleep, and do not smoke. Avoid using alcohol or drugs to relax.  Strong support network. Spend time with family, friends, or other people you enjoy being around.Express your feelings and talk things over with someone you trust. Counseling or talktherapy with a mental health professional may be helpful if you are having difficulty managing stress on your own. Medicine is typically not recommended for the treatment of stress.Talk to your health care provider if you think you need medicine for symptoms of stress. HOME CARE INSTRUCTIONS  Keep all follow-up visits as directed by your health care provider.  Take all medicines as directed by your health care provider. SEEK MEDICAL CARE IF:  Your symptoms get worse or you start having new symptoms.  You feel overwhelmed by your problems and can no longer manage them on your own. SEEK IMMEDIATE MEDICAL CARE IF:  You feel like hurting yourself or someone else. Document Released: 12/10/2000 Document Revised: 10/31/2013 Document Reviewed: 02/08/2013 [facility] Patient Information 2015 [place], [place]. This information is not intended to replace advice given to you by your health care provider. Make sure you discuss any questions you have with your health care provider.

## 2014-07-10 NOTE — Progress Notes (Signed)
MRN: [id number] DOB: October 11, 1975  Subjective:   ARWEN HASELEY is a 39 y.o. female presenting for ear pain, extremity cyanosis, and anxiety.  Ear pain: Patient reports that she was having intermittent throbbing ear about 2 weeks ago that lasted for about 2-3 weeks.  She reports no upper respiratory symptoms such as congestion, sore throat, coughing, or fever.  She denies any imbalance, tinnitus, or dizziness.  The symptoms spontaneously resolved.    Cyanotic extremities: At last visit, patient had concern of blue-tinged 1st great toe and upper extremity dysesthesia.  Patient reports no flare ups.  Patient has been observant of keeping extremities warm.  Patient had received a referral to rheumatology and vascular surgery, but never had appointment scheduled, despite attempt by referring provider.  The symptoms are only observant when she may reach for a cold object from the fridge, but it does not feel cold according to patient.  Patient denies extremity pain, joint swelling, erythema, or pain to joints or extremities.    Anxiety: Patient reports months of anxiety, primarily at the work place.  Patient reports that she will feel chest tightness at the office.  She denies any other anxiety feelings such as tremulousness, nausea, palpitations, heart racing, vision changes, tinnitus, choking or tingling sensation.  She states that the chest tightness occurs at rest without diaphoresis, n/v, palpitations, of sob.  It is relieved within minutes after deep breathing.  It occurs with stressful occurrences at work.  She states that there have been numerous layoffs, and understaffed.  She feels very overwhelmed at this point, and is contemplating quitting her job.  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  She denies depressive symptoms--there are no SI/HI.  She also denies any loss of interest, change in  appetite, sleep, energy, worthless or guilt.  She does have some agitation, as she feels a little short of patience at home.  Patient is currently no engaging in exercise.  She no longer takes the xanax from 2 years ago with the sickness and death of her father.  She states that she has tried Wellbutrin which did not help.  She also tried Zoloft which she reports headaches with this medication.      Mother has arthritis at neck and shoulders.    Shirlene has a current medication list which includes the following prescription(s): alprazolam, vitamin c, epinephrine, gabapentin, multiple vitamins-minerals, diclofenac, and triamcinolone cream.  She is allergic to bextra; flagyl; prednisone; and vioxx.  Shyna  has a past medical history of Allergy; Depression; Asthma; and Arthritis. Also  has past surgical history that includes Breast surgery and Knee surgery.  Family History  Problem Relation Age of Onset  . Asthma Son   -No hx of rheumatoid in family, however mother has arthritis of neck in sh  ROS As in subjective.  Objective:   Vitals: BP 126/78 mmHg  Pulse 92  Temp(Src) 97.8 F (36.6 C) (Oral)  Resp 16  Ht  (1.651 m)  Wt 164 lb (74.39 kg)  BMI 27.29 kg/m2  SpO2 97%  LMP 06/18/2014  Physical Exam  Constitutional: She is well-developed, well-nourished, and in no distress. No distress.  HENT:  Head: Normocephalic and atraumatic.  Eyes: Conjunctivae are normal. Pupils are equal, round, and reactive to light. Right eye exhibits no discharge. Left eye exhibits no discharge.  Neck: Normal range of motion. Neck supple. No  thyromegaly present.  Cardiovascular: Normal rate, regular rhythm, normal heart sounds and intact distal pulses.  Exam reveals no gallop and no friction rub.   No murmur heard. Pulses:      Dorsalis pedis pulses are 2+ on the right side, and 2+ on the left side.  Capillary refill <3 in all upper and lower extremities, though extremities are markedly cool.      Pulmonary/Chest: Effort normal and breath sounds normal. No respiratory distress. She has no wheezes.  Abdominal: Soft. Normal appearance and bowel sounds are normal. There is no tenderness.  Lymphadenopathy:    She has no cervical adenopathy.  Skin: Skin is warm and dry. No erythema.  Extremity dryness and scaling without erythema, hyperkeratosis, or plaques.    Psychiatric: Memory and judgment normal. Her affect is labile.     Assessment and Plan :  Patient is a 39 year old female is here today for follow up of cyanotic extremities and dysesthesia, and concern of ear pain and anxiety.    Anxiety - Plan: Alprazolam (XANAX) 0.25 MG tablet, Fluoxetine (PROZAC) 20 MG tablet -Wake hours are dominated by work life where anxiety is heightened.  Prescribed fluoxetine to manage general daily symptoms.  Also ordered xanax to aid in symptoms, especially while SSRI becomes more efficacious.  Will follow up in 4-6 weeks for symptoms.  Patient declines counseling at this time.    Allergy to bee sting - Plan: Epinephrine 0.3 mg/0.3 mL IJ SOAJ injection  Leukopenia Will recheck CBC  Elevated alkaline phosphatase level  -Ordered CMET  Chest tightness  -This appears more anxiety related without co-symptoms, abnormal exam or ekg.   -Ordered TSH and electrolytes to r/o  Dysesthesia Toe cyanosis -Stable with improvement, refer if symptoms resurface

## 2014-07-12 NOTE — Progress Notes (Deleted)
Patient discussed/examined with Ms. English. . Agree with assessment and plan of care per her note.   

## 2014-07-12 NOTE — Progress Notes (Signed)
Patient discussed/examined with Ms. English. Agree with assessment and plan of care per her note. EKG: SR, no acute findings.  Counseled on anxiety and stress management, will start prozac with hx of depression/anxiety, and recommended counseling, but declined at this point. rtc precautions discussed.

## 2014-08-04 ENCOUNTER — Other Ambulatory Visit: Payer: Self-pay | Admitting: Physician Assistant

## 2014-08-30 ENCOUNTER — Other Ambulatory Visit: Payer: Self-pay

## 2014-08-30 DIAGNOSIS — F419 Anxiety disorder, unspecified: Secondary | ICD-10-CM

## 2014-08-30 MED ORDER — FLUOXETINE HCL 20 MG PO TABS: 20.0000 mg | ORAL_TABLET | Freq: Every day | ORAL | Status: DC

## 2014-10-09 ENCOUNTER — Ambulatory Visit (INDEPENDENT_AMBULATORY_CARE_PROVIDER_SITE_OTHER): Payer: 59 | Admitting: Family Medicine

## 2014-10-09 ENCOUNTER — Encounter: Payer: Self-pay | Admitting: Family Medicine

## 2014-10-09 ENCOUNTER — Other Ambulatory Visit: Payer: Self-pay | Admitting: Physician Assistant

## 2014-10-09 VITALS — BP 134/77 | HR 75 | Temp 98.4°F | Resp 16 | Ht 64.5 in | Wt 164.0 lb

## 2014-10-09 DIAGNOSIS — F419 Anxiety disorder, unspecified: Secondary | ICD-10-CM

## 2014-10-09 MED ORDER — FLUOXETINE HCL 20 MG PO TABS: 20.0000 mg | ORAL_TABLET | Freq: Every day | ORAL | Status: DC

## 2014-10-09 NOTE — Progress Notes (Addendum)
Subjective:  This chart was scribed for [name], MD by [facility], medical scribe at [facility].The patient was seen in exam room 29 and the patient's care was started at 4:01 PM.   Patient ID: Ann Sampson, female    DOB: Nov 23, 1975, 39 y.o.   MRN: [id number] Chief Complaint  Patient presents with  . Medication Refill    prozac   HPI HPI Comments: Ann Sampson is a 39 y.o. female with a history of anxiety and depression who presents to [facility] for a medication refill. She needs her Prozac refilled.  Anxiety: Initially she was treated by Dr. Patsy Lager in 2013 and prescribed 0.25 mg Xanax She was last seen by here on 07/10/2014 by Judeth Cornfield English PA-C. She was having increased work place anxiety and chest tightness with these anxiety symptoms. Anxiety was improved with deep breathing but noticed primarily during stressful times at work. She felt overwhelmed with understaffed work place, numerous lay off and concern with job security. She denied depression. A trial of Prozac 20 mg and 0.25 mg to relieve anxiety. She had a normal TSH. Counseling was declined at that time.  Anxiety has improved since using Prozac. No side effects of her Prozac, work is unchanged but they have brought in new people and it will take about 6 months to relieve some of the stresses at work. She has not used her Xanax as much but uses it when she is feeling overwhelmed. Pt states she has not had trouble sleeping. Pt does not have a real "decompression time" at work. Mild exercise at work but none after work.  There are no active problems to display for this patient.  Past Medical History  Diagnosis Date  . Allergy   . Depression   . Asthma   . Arthritis    Past Surgical History  Procedure Laterality Date  . Breast surgery    . Knee surgery     Allergies  Allergen Reactions  . Bextra [Valdecoxib]   . Flagyl [Metronidazole]   . Prednisone    Anaphylaxis   . Vioxx [Rofecoxib]    Prior to Admission medications   Medication Sig Start Date End Date Taking? Authorizing Provider  ALPRAZolam (XANAX) 0.25 MG tablet Take 1 tablet (0.25 mg total) by mouth 2 (two) times daily as needed for anxiety. 07/10/14  Yes Stephanie D English, PA  Ascorbic Acid (VITAMIN C) 100 MG tablet Take 100 mg by mouth daily.   Yes Historical Provider, MD  diclofenac (VOLTAREN) 75 MG EC tablet Take 1 tablet (75 mg total) by mouth 2 (two) times daily. 09/30/13  Yes Shade Flood, MD  EPINEPHrine 0.3 mg/0.3 mL IJ SOAJ injection Inject 0.3 mLs (0.3 mg total) into the muscle once. 07/10/14  Yes Collie Siad English, PA  FLUoxetine (PROZAC) 20 MG tablet Take 1 tablet (20 mg total) by mouth daily. Take  1 tablet by mouth daily.PATIENT NEEDS OFFICE VISIT FOR ADDITIONAL REFILLS 08/30/14  Yes Collie Siad English, PA  gabapentin (NEURONTIN) 300 MG capsule TAKE 1 CAPSULE TWICE DAILY.   "OV NEEDED FOR ADDL REFILLS" 08/05/14  Yes Chelle S Jeffery, PA-C  Multiple Vitamins-Minerals (ADULT GUMMY PO) Take by mouth.   Yes Historical Provider, MD  triamcinolone cream (KENALOG) 0.1 % Apply 1 application topically 2 (two) times daily. Patient not taking: Reported on 07/10/2014 08/18/13   Peyton Najjar, MD   History   Social History  . Marital Status:  Divorced    Spouse Name: N/A  . Number of Children: N/A  . Years of Education: N/A   Occupational History  . Not on file.   Social History Main Topics  . Smoking status: Former Games developermoker  . Smokeless tobacco: Never Used  . Alcohol Use: No  . Drug Use: No  . Sexual Activity: Not on file   Other Topics Concern  . Not on file   Social History Narrative   Review of Systems  Respiratory: Negative for chest tightness.   Psychiatric/Behavioral: Negative for sleep disturbance. The patient is nervous/anxious.       Objective:  BP 134/77 mmHg  Pulse 75  Temp(Src) 98.4 F (36.9 C) (Oral)  Resp 16  Ht 5' 4.5" (1.638 m)  Wt 164 lb  (74.39 kg)  BMI 27.73 kg/m2  SpO2 100%  LMP 09/21/2014 Physical Exam  Constitutional: She is oriented to person, place, and time. She appears well-developed and well-nourished. No distress.  HENT:  Head: Normocephalic and atraumatic.  Eyes: Pupils are equal, round, and reactive to light.  Neck: Normal range of motion.  Cardiovascular: Normal rate and regular rhythm.   Pulmonary/Chest: Effort normal. No respiratory distress.  Musculoskeletal: Normal range of motion.  Neurological: She is alert and oriented to person, place, and time.  Skin: Skin is warm and dry.  Psychiatric: She has a normal mood and affect. Her behavior is normal.  Nursing note and vitals reviewed.     Assessment & Plan:   Ann LarkRhonda M Sampson is a 39 y.o. female Anxiety - Plan: FLUoxetine (PROZAC) 20 MG tablet  - stable. Controlled with current dose Prozac. Has xanax if needed for breakthrough symptoms. Discussed stress mgt strategies.  Recheck in next 6 months.  Consider trial off SSRI if continues to improve. rtc precautions.  Meds ordered this encounter  Medications  . FLUoxetine (PROZAC) 20 MG tablet    Sig: Take 1 tablet (20 mg total) by mouth daily. Take  1 tablet by mouth daily.    Dispense:  90 tablet    Refill:  1   Patient Instructions  No change in meds for now. recheck in 6 months. Let me know if there are any questions prior to that time. Have a good summer.      Claudette Stapler.jgs

## 2014-10-09 NOTE — Patient Instructions (Signed)
No change in meds for now. recheck in 6 months. Let me know if there are any questions prior to that time. Have a good summer.

## 2014-10-10 NOTE — Telephone Encounter (Signed)
Dr Neva SeatGreene, do you want to give pt RFs or have her come back in for re-check for this med?

## 2014-10-10 NOTE — Telephone Encounter (Signed)
We did not discuss this specifically at office visit yesterday. Please clarify how often she is taking this and is this still for symptoms in feet?  I do not mind refilling, but more info please. Thanks.

## 2014-10-11 NOTE — Telephone Encounter (Signed)
Dr Neva SeatGreene, I spoke to pt and she reported that she does still take this for the numbness and tingling in her hands and feet. She stated that she usually only takes it once a day and it seems to control it to the point that it does not interfere with her life. I have not changed the Rx sig or # because not sure if you would want to. She apologized that she didn't realize that she didn't have any refills left until the night after she saw you when she tried to call in for a refill.

## 2014-10-11 NOTE — Telephone Encounter (Signed)
Thanks. I refilled med for 6 months as stable.  We can discuss it further at follow up.

## 2014-10-12 NOTE — Telephone Encounter (Signed)
Notified pt RFs sent. 

## 2015-03-15 ENCOUNTER — Encounter: Payer: Self-pay | Admitting: Family Medicine

## 2015-03-15 ENCOUNTER — Ambulatory Visit (INDEPENDENT_AMBULATORY_CARE_PROVIDER_SITE_OTHER): Payer: 59 | Admitting: Family Medicine

## 2015-03-15 VITALS — BP 126/78 | HR 88 | Temp 98.9°F | Resp 16 | Ht 64.5 in | Wt 174.0 lb

## 2015-03-15 DIAGNOSIS — J069 Acute upper respiratory infection, unspecified: Secondary | ICD-10-CM | POA: Diagnosis not present

## 2015-03-15 DIAGNOSIS — H9202 Otalgia, left ear: Secondary | ICD-10-CM

## 2015-03-15 MED ORDER — AMOXICILLIN 875 MG PO TABS: 875.0000 mg | ORAL_TABLET | Freq: Two times a day (BID) | ORAL | Status: DC

## 2015-03-15 NOTE — Progress Notes (Signed)
Subjective:  This chart was scribed for [name], MD by [facility], medical scribe at [facility].The patient was seen in exam room 08 and the patient's care was started at 3:09 PM.   Patient ID: Ann Sampson, female    DOB: 01-13-1976, 39 y.o.   MRN: [id number] Chief Complaint  Patient presents with  . Sinusitis    x 4 days   . Cough  . Headache  . Flu Vaccine   HPI HPI Comments: Ann Sampson is a 39 y.o. female who presents to [facility] complaining of sore throat, rhinorrhea, a worsening headache, congestion, a more productive cough today. Fatigue today. Bilateral ear pressure. No drainage from the ear. Symptoms began four days ago. Tolerating fluids and solids. Alka seltzer plus and lozenges for relief. Sick contacts at work. No fever  There are no active problems to display for this patient.  Past Medical History  Diagnosis Date  . Allergy   . Depression   . Asthma   . Arthritis    Past Surgical History  Procedure Laterality Date  . Breast surgery    . Knee surgery     Allergies  Allergen Reactions  . Bextra [Valdecoxib]   . Flagyl [Metronidazole]   . Prednisone     Anaphylaxis   . Vioxx [Rofecoxib]    Prior to Admission medications   Medication Sig Start Date End Date Taking? Authorizing Provider  ALPRAZolam (XANAX) 0.25 MG tablet Take 1 tablet (0.25 mg total) by mouth 2 (two) times daily as needed for anxiety. 07/10/14  Yes Stephanie D English, PA  EPINEPHrine 0.3 mg/0.3 mL IJ SOAJ injection Inject 0.3 mLs (0.3 mg total) into the muscle once. 07/10/14  Yes Collie Siad English, PA  FLUoxetine (PROZAC) 20 MG tablet Take 1 tablet (20 mg total) by mouth daily. Take  1 tablet by mouth daily. 10/09/14  Yes Shade Flood, MD  gabapentin (NEURONTIN) 300 MG capsule Take 1 capsule (300 mg total) by mouth 2 (two) times daily as needed. 10/11/14  Yes Shade Flood, MD  Multiple Vitamins-Minerals (ADULT GUMMY PO) Take  by mouth.   Yes Historical Provider, MD  Ascorbic Acid (VITAMIN C) 100 MG tablet Take 100 mg by mouth daily.    Historical Provider, MD  diclofenac (VOLTAREN) 75 MG EC tablet Take 1 tablet (75 mg total) by mouth 2 (two) times daily. Patient not taking: Reported on 03/15/2015 09/30/13   Shade Flood, MD  triamcinolone cream (KENALOG) 0.1 % Apply 1 application topically 2 (two) times daily. Patient not taking: Reported on 07/10/2014 08/18/13   Peyton Najjar, MD   Social History   Social History  . Marital Status: Divorced    Spouse Name: N/A  . Number of Children: N/A  . Years of Education: N/A   Occupational History  . Not on file.   Social History Main Topics  . Smoking status: Former Games developer  . Smokeless tobacco: Never Used  . Alcohol Use: No  . Drug Use: No  . Sexual Activity: Not on file   Other Topics Concern  . Not on file   Social History Narrative   Review of Systems  Constitutional: Positive for fatigue. Negative for fever.  HENT: Positive for congestion, rhinorrhea, sinus pressure and sore throat.   Respiratory: Positive for cough.   Neurological: Positive for headaches.      Objective:  BP 126/78 mmHg  Pulse 88  Temp(Src) 98.9  F (37.2 C) (Oral)  Resp 16  Ht 5' 4.5" (1.638 m)  Wt 174 lb (78.926 kg)  BMI 29.42 kg/m2  SpO2 97%  LMP 02/22/2015 Physical Exam  Constitutional: She is oriented to person, place, and time. She appears well-developed and well-nourished. No distress.  HENT:  Head: Normocephalic and atraumatic.  Mouth/Throat: Oropharynx is clear and moist and mucous membranes are normal. No oropharyngeal exudate, posterior oropharyngeal edema or posterior oropharyngeal erythema.  Minimal erythema and injection of the left TM. Minimal clear fluid at the base of the left TM. Right TM is pearly grey. Sinuses are non-tender  Eyes: Pupils are equal, round, and reactive to light.  Neck: Normal range of motion.  Cardiovascular: Normal rate and regular  rhythm.   Pulmonary/Chest: Effort normal and breath sounds normal. No respiratory distress.  Musculoskeletal: Normal range of motion.  Lymphadenopathy:    She has no cervical adenopathy.  Neurological: She is alert and oriented to person, place, and time.  Skin: Skin is warm and dry.  Psychiatric: She has a normal mood and affect. Her behavior is normal.  Nursing note and vitals reviewed.     Assessment & Plan:   Ann Sampson is a 39 y.o. female Acute upper respiratory infection  Otalgia, left - Plan: amoxicillin (AMOXIL) 875 MG tablet  suspected viral upper respiratory infection. Symptomatic care discussed, fluids, rest , return to clinic precautions  - left otalgia, slight erythema, suspected viral cause, but if increased pain over the next few days,  Or fever, can fill amoxicillin for early otitis media. Side effects discussed.  RTC precautions  Meds ordered this encounter  Medications  . amoxicillin (AMOXIL) 875 MG tablet    Sig: Take 1 tablet (875 mg total) by mouth 2 (two) times daily.    Dispense:  20 tablet    Refill:  0   Patient Instructions  Saline nasal spray atleast 4 times per day, over the counter mucinex or mucinex DM, drink plenty of fluids and rest as needed. If ear pain not improving in next few days, or worsening - can start amoxicillin, bu doubt infection at this time.  Return to the clinic or go to the nearest emergency room if any of your symptoms worsen or new symptoms occur.   Upper Respiratory Infection, Adult An upper respiratory infection (URI) is also sometimes known as the common cold. The upper respiratory tract includes the nose, sinuses, throat, trachea, and bronchi. Bronchi are the airways leading to the lungs. Most people improve within 1 week, but symptoms can last up to 2 weeks. A residual cough may last even longer.  CAUSES Many different viruses can infect the tissues lining the upper respiratory tract. The tissues become irritated and  inflamed and often become very moist. Mucus production is also common. A cold is contagious. You can easily spread the virus to others by oral contact. This includes kissing, sharing a glass, coughing, or sneezing. Touching your mouth or nose and then touching a surface, which is then touched by another person, can also spread the virus. SYMPTOMS  Symptoms typically develop 1 to 3 days after you come in contact with a cold virus. Symptoms vary from person to person. They may include:  Runny nose.  Sneezing.  Nasal congestion.  Sinus irritation.  Sore throat.  Loss of voice (laryngitis).  Cough.  Fatigue.  Muscle aches.  Loss of appetite.  Headache.  Low-grade fever. DIAGNOSIS  You might diagnose your own cold based on familiar symptoms,  since most people get a cold 2 to 3 times a year. Your caregiver can confirm this based on your exam. Most importantly, your caregiver can check that your symptoms are not due to another disease such as strep throat, sinusitis, pneumonia, asthma, or epiglottitis. Blood tests, throat tests, and X-rays are not necessary to diagnose a common cold, but they may sometimes be helpful in excluding other more serious diseases. Your caregiver will decide if any further tests are required. RISKS AND COMPLICATIONS  You may be at risk for a more severe case of the common cold if you smoke cigarettes, have chronic heart disease (such as heart failure) or lung disease (such as asthma), or if you have a weakened immune system. The very young and very old are also at risk for more serious infections. Bacterial sinusitis, middle ear infections, and bacterial pneumonia can complicate the common cold. The common cold can worsen asthma and chronic obstructive pulmonary disease (COPD). Sometimes, these complications can require emergency medical care and may be life-threatening. PREVENTION  The best way to protect against getting a cold is to practice good hygiene. Avoid  oral or hand contact with people with cold symptoms. Wash your hands often if contact occurs. There is no clear evidence that vitamin C, vitamin E, echinacea, or exercise reduces the chance of developing a cold. However, it is always recommended to get plenty of rest and practice good nutrition. TREATMENT  Treatment is directed at relieving symptoms. There is no cure. Antibiotics are not effective, because the infection is caused by a virus, not by bacteria. Treatment may include:  Increased fluid intake. Sports drinks offer valuable electrolytes, sugars, and fluids.  Breathing heated mist or steam (vaporizer or shower).  Eating chicken soup or other clear broths, and maintaining good nutrition.  Getting plenty of rest.  Using gargles or lozenges for comfort.  Controlling fevers with ibuprofen or acetaminophen as directed by your caregiver.  Increasing usage of your inhaler if you have asthma. Zinc gel and zinc lozenges, taken in the first 24 hours of the common cold, can shorten the duration and lessen the severity of symptoms. Pain medicines may help with fever, muscle aches, and throat pain. A variety of non-prescription medicines are available to treat congestion and runny nose. Your caregiver can make recommendations and may suggest nasal or lung inhalers for other symptoms.  HOME CARE INSTRUCTIONS   Only take over-the-counter or prescription medicines for pain, discomfort, or fever as directed by your caregiver.  Use a warm mist humidifier or inhale steam from a shower to increase air moisture. This may keep secretions moist and make it easier to breathe.  Drink enough water and fluids to keep your urine clear or pale yellow.  Rest as needed.  Return to work when your temperature has returned to normal or as your caregiver advises. You may need to stay home longer to avoid infecting others. You can also use a face mask and careful hand washing to prevent spread of the virus. SEEK  MEDICAL CARE IF:   After the first few days, you feel you are getting worse rather than better.  You need your caregiver's advice about medicines to control symptoms.  You develop chills, worsening shortness of breath, or brown or red sputum. These may be signs of pneumonia.  You develop yellow or brown nasal discharge or pain in the face, especially when you bend forward. These may be signs of sinusitis.  You develop a fever, swollen neck glands, pain  with swallowing, or white areas in the back of your throat. These may be signs of strep throat. SEEK IMMEDIATE MEDICAL CARE IF:   You have a fever.  You develop severe or persistent headache, ear pain, sinus pain, or chest pain.  You develop wheezing, a prolonged cough, cough up blood, or have a change in your usual mucus (if you have chronic lung disease).  You develop sore muscles or a stiff neck. Document Released: 12/10/2000 Document Revised: 09/08/2011 Document Reviewed: 09/21/2013 [facility] Patient Information 2015 [place], [place]. This information is not intended to replace advice given to you by your health care provider. Make sure you discuss any questions you have with your health care provider.   Otalgia The most common reason for this in children is an infection of the middle ear. Pain from the middle ear is usually caused by a build-up of fluid and pressure behind the eardrum. Pain from an earache can be sharp, dull, or burning. The pain may be temporary or constant. The middle ear is connected to the nasal passages by a short narrow tube called the Eustachian tube. The Eustachian tube allows fluid to drain out of the middle ear, and helps keep the pressure in your ear equalized. CAUSES  A cold or allergy can block the Eustachian tube with inflammation and the build-up of secretions. This is especially likely in small children, because their Eustachian tube is shorter and more horizontal. When the Eustachian tube closes, the  normal flow of fluid from the middle ear is stopped. Fluid can accumulate and cause stuffiness, pain, hearing loss, and an ear infection if germs start growing in this area. SYMPTOMS  The symptoms of an ear infection may include fever, ear pain, fussiness, increased crying, and irritability. Many children will have temporary and minor hearing loss during and right after an ear infection. Permanent hearing loss is rare, but the risk increases the more infections a child has. Other causes of ear pain include retained water in the outer ear canal from swimming and bathing. Ear pain in adults is less likely to be from an ear infection. Ear pain may be referred from other locations. Referred pain may be from the joint between your jaw and the skull. It may also come from a tooth problem or problems in the neck. Other causes of ear pain include:  A foreign body in the ear.  Outer ear infection.  Sinus infections.  Impacted ear wax.  Ear injury.  Arthritis of the jaw or TMJ problems.  Middle ear infection.  Tooth infections.  Sore throat with pain to the ears. DIAGNOSIS  Your caregiver can usually make the diagnosis by examining you. Sometimes other special studies, including x-rays and lab work may be necessary. TREATMENT   If antibiotics were prescribed, use them as directed and finish them even if you or your child's symptoms seem to be improved.  Sometimes PE tubes are needed in children. These are little plastic tubes which are put into the eardrum during a simple surgical procedure. They allow fluid to drain easier and allow the pressure in the middle ear to equalize. This helps relieve the ear pain caused by pressure changes. HOME CARE INSTRUCTIONS   Only take over-the-counter or prescription medicines for pain, discomfort, or fever as directed by your caregiver. DO NOT GIVE CHILDREN ASPIRIN because of the association of Reye's Syndrome in children taking aspirin.  Use a cold pack  applied to the outer ear for 15-20 minutes, 03-04 times per day  or as needed may reduce pain. Do not apply ice directly to the skin. You may cause frost bite.  Over-the-counter ear drops used as directed may be effective. Your caregiver may sometimes prescribe ear drops.  Resting in an upright position may help reduce pressure in the middle ear and relieve pain.  Ear pain caused by rapidly descending from high altitudes can be relieved by swallowing or chewing gum. Allowing infants to suck on a bottle during airplane travel can help.  Do not smoke in the house or near children. If you are unable to quit smoking, smoke outside.  Control allergies. SEEK IMMEDIATE MEDICAL CARE IF:   You or your child are becoming sicker.  Pain or fever relief is not obtained with medicine.  You or your child's symptoms (pain, fever, or irritability) do not improve within 24 to 48 hours or as instructed.  Severe pain suddenly stops hurting. This may indicate a ruptured eardrum.  You or your children develop new problems such as severe headaches, stiff neck, difficulty swallowing, or swelling of the face or around the ear. Document Released: 02/01/2004 Document Revised: 09/08/2011 Document Reviewed: 06/07/2008 [facility] Patient Information 2015 [place], [place]. This information is not intended to replace advice given to you by your health care provider. Make sure you discuss any questions you have with your health care provider.     I personally performed the services described in this documentation, which was scribed in my presence. The recorded information has been reviewed and considered, and addended by me as needed.

## 2015-03-15 NOTE — Patient Instructions (Signed)
Saline nasal spray atleast 4 times per day, over the counter mucinex or mucinex DM, drink plenty of fluids and rest as needed. If ear pain not improving in next few days, or worsening - can start amoxicillin, bu doubt infection at this time.  Return to the clinic or go to the nearest emergency room if any of your symptoms worsen or new symptoms occur.   Upper Respiratory Infection, Adult An upper respiratory infection (URI) is also sometimes known as the common cold. The upper respiratory tract includes the nose, sinuses, throat, trachea, and bronchi. Bronchi are the airways leading to the lungs. Most people improve within 1 week, but symptoms can last up to 2 weeks. A residual cough may last even longer.  CAUSES Many different viruses can infect the tissues lining the upper respiratory tract. The tissues become irritated and inflamed and often become very moist. Mucus production is also common. A cold is contagious. You can easily spread the virus to others by oral contact. This includes kissing, sharing a glass, coughing, or sneezing. Touching your mouth or nose and then touching a surface, which is then touched by another person, can also spread the virus. SYMPTOMS  Symptoms typically develop 1 to 3 days after you come in contact with a cold virus. Symptoms vary from person to person. They may include:  Runny nose.  Sneezing.  Nasal congestion.  Sinus irritation.  Sore throat.  Loss of voice (laryngitis).  Cough.  Fatigue.  Muscle aches.  Loss of appetite.  Headache.  Low-grade fever. DIAGNOSIS  You might diagnose your own cold based on familiar symptoms, since most people get a cold 2 to 3 times a year. Your caregiver can confirm this based on your exam. Most importantly, your caregiver can check that your symptoms are not due to another disease such as strep throat, sinusitis, pneumonia, asthma, or epiglottitis. Blood tests, throat tests, and X-rays are not necessary to  diagnose a common cold, but they may sometimes be helpful in excluding other more serious diseases. Your caregiver will decide if any further tests are required. RISKS AND COMPLICATIONS  You may be at risk for a more severe case of the common cold if you smoke cigarettes, have chronic heart disease (such as heart failure) or lung disease (such as asthma), or if you have a weakened immune system. The very young and very old are also at risk for more serious infections. Bacterial sinusitis, middle ear infections, and bacterial pneumonia can complicate the common cold. The common cold can worsen asthma and chronic obstructive pulmonary disease (COPD). Sometimes, these complications can require emergency medical care and may be life-threatening. PREVENTION  The best way to protect against getting a cold is to practice good hygiene. Avoid oral or hand contact with people with cold symptoms. Wash your hands often if contact occurs. There is no clear evidence that vitamin C, vitamin E, echinacea, or exercise reduces the chance of developing a cold. However, it is always recommended to get plenty of rest and practice good nutrition. TREATMENT  Treatment is directed at relieving symptoms. There is no cure. Antibiotics are not effective, because the infection is caused by a virus, not by bacteria. Treatment may include:  Increased fluid intake. Sports drinks offer valuable electrolytes, sugars, and fluids.  Breathing heated mist or steam (vaporizer or shower).  Eating chicken soup or other clear broths, and maintaining good nutrition.  Getting plenty of rest.  Using gargles or lozenges for comfort.  Controlling fevers with ibuprofen  or acetaminophen as directed by your caregiver.  Increasing usage of your inhaler if you have asthma. Zinc gel and zinc lozenges, taken in the first 24 hours of the common cold, can shorten the duration and lessen the severity of symptoms. Pain medicines may help with fever,  muscle aches, and throat pain. A variety of non-prescription medicines are available to treat congestion and runny nose. Your caregiver can make recommendations and may suggest nasal or lung inhalers for other symptoms.  HOME CARE INSTRUCTIONS   Only take over-the-counter or prescription medicines for pain, discomfort, or fever as directed by your caregiver.  Use a warm mist humidifier or inhale steam from a shower to increase air moisture. This may keep secretions moist and make it easier to breathe.  Drink enough water and fluids to keep your urine clear or pale yellow.  Rest as needed.  Return to work when your temperature has returned to normal or as your caregiver advises. You may need to stay home longer to avoid infecting others. You can also use a face mask and careful hand washing to prevent spread of the virus. SEEK MEDICAL CARE IF:   After the first few days, you feel you are getting worse rather than better.  You need your caregiver's advice about medicines to control symptoms.  You develop chills, worsening shortness of breath, or brown or red sputum. These may be signs of pneumonia.  You develop yellow or brown nasal discharge or pain in the face, especially when you bend forward. These may be signs of sinusitis.  You develop a fever, swollen neck glands, pain with swallowing, or white areas in the back of your throat. These may be signs of strep throat. SEEK IMMEDIATE MEDICAL CARE IF:   You have a fever.  You develop severe or persistent headache, ear pain, sinus pain, or chest pain.  You develop wheezing, a prolonged cough, cough up blood, or have a change in your usual mucus (if you have chronic lung disease).  You develop sore muscles or a stiff neck. Document Released: 12/10/2000 Document Revised: 09/08/2011 Document Reviewed: 09/21/2013 [facility] Patient Information 2015 [place], [place]. This information is not intended to replace advice given to you by your health  care provider. Make sure you discuss any questions you have with your health care provider.   Otalgia The most common reason for this in children is an infection of the middle ear. Pain from the middle ear is usually caused by a build-up of fluid and pressure behind the eardrum. Pain from an earache can be sharp, dull, or burning. The pain may be temporary or constant. The middle ear is connected to the nasal passages by a short narrow tube called the Eustachian tube. The Eustachian tube allows fluid to drain out of the middle ear, and helps keep the pressure in your ear equalized. CAUSES  A cold or allergy can block the Eustachian tube with inflammation and the build-up of secretions. This is especially likely in small children, because their Eustachian tube is shorter and more horizontal. When the Eustachian tube closes, the normal flow of fluid from the middle ear is stopped. Fluid can accumulate and cause stuffiness, pain, hearing loss, and an ear infection if germs start growing in this area. SYMPTOMS  The symptoms of an ear infection may include fever, ear pain, fussiness, increased crying, and irritability. Many children will have temporary and minor hearing loss during and right after an ear infection. Permanent hearing loss is rare, but the  risk increases the more infections a child has. Other causes of ear pain include retained water in the outer ear canal from swimming and bathing. Ear pain in adults is less likely to be from an ear infection. Ear pain may be referred from other locations. Referred pain may be from the joint between your jaw and the skull. It may also come from a tooth problem or problems in the neck. Other causes of ear pain include:  A foreign body in the ear.  Outer ear infection.  Sinus infections.  Impacted ear wax.  Ear injury.  Arthritis of the jaw or TMJ problems.  Middle ear infection.  Tooth infections.  Sore throat with pain to the ears. DIAGNOSIS    Your caregiver can usually make the diagnosis by examining you. Sometimes other special studies, including x-rays and lab work may be necessary. TREATMENT   If antibiotics were prescribed, use them as directed and finish them even if you or your child's symptoms seem to be improved.  Sometimes PE tubes are needed in children. These are little plastic tubes which are put into the eardrum during a simple surgical procedure. They allow fluid to drain easier and allow the pressure in the middle ear to equalize. This helps relieve the ear pain caused by pressure changes. HOME CARE INSTRUCTIONS   Only take over-the-counter or prescription medicines for pain, discomfort, or fever as directed by your caregiver. DO NOT GIVE CHILDREN ASPIRIN because of the association of Reye's Syndrome in children taking aspirin.  Use a cold pack applied to the outer ear for 15-20 minutes, 03-04 times per day or as needed may reduce pain. Do not apply ice directly to the skin. You may cause frost bite.  Over-the-counter ear drops used as directed may be effective. Your caregiver may sometimes prescribe ear drops.  Resting in an upright position may help reduce pressure in the middle ear and relieve pain.  Ear pain caused by rapidly descending from high altitudes can be relieved by swallowing or chewing gum. Allowing infants to suck on a bottle during airplane travel can help.  Do not smoke in the house or near children. If you are unable to quit smoking, smoke outside.  Control allergies. SEEK IMMEDIATE MEDICAL CARE IF:   You or your child are becoming sicker.  Pain or fever relief is not obtained with medicine.  You or your child's symptoms (pain, fever, or irritability) do not improve within 24 to 48 hours or as instructed.  Severe pain suddenly stops hurting. This may indicate a ruptured eardrum.  You or your children develop new problems such as severe headaches, stiff neck, difficulty swallowing, or  swelling of the face or around the ear. Document Released: 02/01/2004 Document Revised: 09/08/2011 Document Reviewed: 06/07/2008 [facility] Patient Information 2015 [place], [place]. This information is not intended to replace advice given to you by your health care provider. Make sure you discuss any questions you have with your health care provider.

## 2015-05-31 ENCOUNTER — Other Ambulatory Visit: Payer: Self-pay | Admitting: Family Medicine

## 2015-11-18 ENCOUNTER — Other Ambulatory Visit: Payer: Self-pay | Admitting: Family Medicine

## 2015-11-18 ENCOUNTER — Other Ambulatory Visit: Payer: Self-pay | Admitting: Physician Assistant

## 2015-11-29 ENCOUNTER — Telehealth: Payer: Self-pay

## 2015-11-29 ENCOUNTER — Other Ambulatory Visit: Payer: Self-pay | Admitting: Family Medicine

## 2015-11-29 NOTE — Telephone Encounter (Signed)
Patient scheduled an appointment with Dr. Neva SeatGreene for 01/17/16 @ 4:00pm. Pt states she was told that Dr. Neva SeatGreene would not refill her gabapentin (NEURONTIN) 300 MG capsule [[id number] without seeing her first. I advised patient if that's what she was told then she may want to come into the walk in clinic, I also gave her Dr. Thomasene LotGreen's scheduled hours here at the walk in clinic and she said "let's just see if he'll provide me with the refill without me coming into the walk in clinic, maybe it'll work since I scheduled the appointment".

## 2015-11-30 ENCOUNTER — Telehealth: Payer: Self-pay | Admitting: Emergency Medicine

## 2015-11-30 MED ORDER — GABAPENTIN 300 MG PO CAPS: 300.0000 mg | ORAL_CAPSULE | Freq: Two times a day (BID) | ORAL | Status: DC | PRN

## 2015-11-30 NOTE — Telephone Encounter (Signed)
Pt made aware medication Gabapentin RF per Dr. Neva SeatGreene Medication [facility]

## 2015-12-01 NOTE — Telephone Encounter (Signed)
Per chart rx was sent in yesterday.. Patient made aware.

## 2016-01-17 ENCOUNTER — Encounter: Payer: Self-pay | Admitting: Family Medicine

## 2016-01-17 ENCOUNTER — Ambulatory Visit (INDEPENDENT_AMBULATORY_CARE_PROVIDER_SITE_OTHER): Payer: 59 | Admitting: Family Medicine

## 2016-01-17 VITALS — BP 116/72 | HR 71 | Temp 98.2°F | Resp 18 | Ht 64.5 in | Wt 173.0 lb

## 2016-01-17 DIAGNOSIS — R208 Other disturbances of skin sensation: Secondary | ICD-10-CM | POA: Diagnosis not present

## 2016-01-17 DIAGNOSIS — R21 Rash and other nonspecific skin eruption: Secondary | ICD-10-CM | POA: Diagnosis not present

## 2016-01-17 DIAGNOSIS — R202 Paresthesia of skin: Secondary | ICD-10-CM

## 2016-01-17 DIAGNOSIS — R200 Anesthesia of skin: Secondary | ICD-10-CM

## 2016-01-17 MED ORDER — TRIAMCINOLONE ACETONIDE 0.1 % EX CREA: 1.0000 "application " | TOPICAL_CREAM | Freq: Two times a day (BID) | CUTANEOUS | Status: DC

## 2016-01-17 MED ORDER — GABAPENTIN 300 MG PO CAPS: 300.0000 mg | ORAL_CAPSULE | Freq: Every day | ORAL | Status: DC | PRN

## 2016-01-17 MED ORDER — GABAPENTIN 300 MG PO CAPS: 300.0000 mg | ORAL_CAPSULE | Freq: Two times a day (BID) | ORAL | Status: DC | PRN

## 2016-01-17 NOTE — Patient Instructions (Addendum)
small rash or similar symptoms in the past, you can use the triamcinolone cream. However if spreading redness, or persistent rash, return here or other medical provider to take a look at that rash to make sure we are treating the correct problem.  You can also try Allegra or Claritin over-the-counter if you develop a rash in case this is allergic in cause.  I will refer you to a neurologist to determine other possible causes for the tingling/numbness in hands and feet, but okay to continue the Neurontin at same dose for now.   IF you received an x-ray today, you will receive an invoice from [facility]. Please contact [facility] at [phone] with questions or concerns regarding your invoice.   IF you received labwork today, you will receive an invoice from [facility]. Please contact [facility] at [phone] with questions or concerns regarding your invoice.   Our billing staff will not be able to assist you with questions regarding bills from these companies.  You will be contacted with the lab results as soon as they are available. The fastest way to get your results is to activate your My Chart account. Instructions are located on the last page of this paperwork. If you have not heard from us regarding the results in 2 weeks, please contact this office.

## 2016-01-17 NOTE — Progress Notes (Signed)
By signing my name below, I, Ann Sampson, attest that this documentation has been prepared under the direction and in the presence of [name], MD.  Electronically Signed: Arvilla Market, Medical Scribe. 01/17/2016. 5:00 PM.  Subjective:    Patient ID: Ann Sampson, female    DOB: 1976/06/20, 40 y.o.   MRN: [id number]  HPI Chief Complaint  Patient presents with  . Medication Refill    gabapentin 300mg  and kenalog cream    HPI Comments: Ann Sampson is a 40 y.o. female who presents to the [facility] for follow-up. Pt has a hx of anxiety; last discussed in April 2016. She takes Prozac due to work place anxiety symptoms- pt stopped taking Prozac for about 6 months.. She was tolerating her medication well last April, and decreased needs for Xanax at that time. Planned for recheck in 6 month with possible trial of SSRI if symptoms improved. Last prescription of Xanax January 2016- pt no longer takes Xanax.   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 She was continued on Gabapentin as needed, but if persistent symptoms, recommeded neuro evaluation. Pt takes Gabapentin QD for the pins and needles sensation in her finger tips and feet. Pt states she has to be careful in the winter due to the symptoms in her finger tips. Pt states she could tell when she didn't take it. Pt thinks her symptoms are connected to her back pain because her back would also hurt when she doesn't take Gabapentin. Pt states her symptoms haven't increased since her last office visit. Pt went to the rheumatologist and didn't think her symptoms were rheumatologic.  Kenalog refill for random red rash breakouts . Pt states she's been having random breakouts for years. Pt broke out in a red rash on her left ankles and spread to the back of her leg. Pt states a rash popped up with  Pt used the Kenalog cream every day multiple times  a day while she was in [place]. t states she was out Pt hasn't had her clothes washed by the hotel yet. No new soaps or lotions while on her trip. Pt doesn't currently have the rash. Pt denies trying Zyrtec or Allegra when she's having a breakout.  There are no active problems to display for this patient.  Past Medical History  Diagnosis Date  . Allergy   . Depression   . Asthma   . Arthritis    Past Surgical History  Procedure Laterality Date  . Breast surgery    . Knee surgery     Allergies  Allergen Reactions  . Bextra [Valdecoxib]   . Flagyl [Metronidazole]   . Prednisone     Anaphylaxis   . Vioxx [Rofecoxib]    Prior to Admission medications   Medication Sig Start Date End Date Taking? Authorizing Provider  Ascorbic Acid (VITAMIN C) 100 MG tablet Take 100 mg by mouth daily.   Yes Historical Provider, MD  EPIPEN 2-PAK 0.3 MG/0.3ML SOAJ injection USE AS DIRECTED 11/19/15  Yes Collie Siad English, PA  gabapentin (NEURONTIN) 300 MG capsule Take 1 capsule (300 mg total) by mouth 2 (two) times daily as needed. 11/30/15  Yes Shade Flood, MD  Multiple Vitamins-Minerals (ADULT GUMMY PO) Take by mouth.   Yes Historical Provider, MD  ALPRAZolam (XANAX) 0.25 MG tablet Take 1 tablet (0.25 mg total) by mouth 2 (two) times daily as needed for  anxiety. Patient not taking: Reported on 01/17/2016 07/10/14   Collie SiadStephanie D English, PA  FLUoxetine (PROZAC) 20 MG tablet TAKE 1 TABLET ONCE DAILY  "OFFICE VISIT NEEDED FOR REFILLS" Patient not taking: Reported on 01/17/2016 05/31/15   Shade FloodJeffrey R Novalynn Branaman, MD   Social History   Social History  . Marital Status: Divorced    Spouse Name: N/A  . Number of Children: N/A  . Years of Education: N/A   Occupational History  . Not on file.   Social History Main Topics  . Smoking status: Former Games developermoker  . Smokeless tobacco: Never Used  . Alcohol Use: No  . Drug Use: No  . Sexual Activity: Not on file   Other Topics Concern  . Not on file   Social  History Narrative   Review of Systems  Eyes: Negative for visual disturbance.  Musculoskeletal: Negative for neck pain.  Skin: Positive for rash.  Neurological: Negative for weakness and headaches.    Objective:  BP 116/72 mmHg  Pulse 71  Temp(Src) 98.2 F (36.8 C) (Oral)  Resp 18  Ht 5' 4.5" (1.638 m)  Wt 173 lb (78.472 kg)  BMI 29.25 kg/m2  SpO2 100%  LMP 01/13/2016  Physical Exam  Constitutional: She appears well-developed and well-nourished. No distress.  HENT:  Head: Normocephalic and atraumatic.  Eyes: Conjunctivae are normal.  Neck: Neck supple.  Cardiovascular: Normal rate.   Pulmonary/Chest: Effort normal.  Musculoskeletal:  Negative seated straight leg raise.  Neurological: She is alert.  Reflex Scores:      Tricep reflexes are 2+ on the right side and 2+ on the left side.      Bicep reflexes are 2+ on the right side and 2+ on the left side.      Brachioradialis reflexes are 2+ on the right side and 2+ on the left side. Equal strength upper and lower extremities  Skin: Skin is warm and dry.  Cap refill less than 1 sec at finger tips  Psychiatric: She has a normal mood and affect. Her behavior is normal.  Nursing note and vitals reviewed.  Assessment & Plan:   Ann LarkRhonda M Morton is a 40 y.o. female Paresthesia of both hands -Numbness in feet - Plan: Ambulatory referral to Neurology, gabapentin (NEURONTIN) 300 MG capsule, DISCONTINUED: gabapentin (NEURONTIN) 300 MG capsule  -No known specific cause of dysesthesias/paresthesias, but stable with low dose of 300 mg Neurontin daily, no weakness, and nonfocal exam otherwise.  -refer to neurology for evaluation for possible causes, blood work was deferred today as she likely may have testing performed there. RTC precautions if any worsening of symptoms.  Rash and nonspecific skin eruption - Plan: triamcinolone cream (KENALOG) 0.1 %  -Asymptomatic currently, but if small areas return, can treat with Kenalog cream if  appears to be more contact dermatitis, as well as zyrtec or allegra otc if needed.  If spreading, or large areas of erythema, return here or other medical provider for evaluation of other causes.   Meds ordered this encounter  Medications  . DISCONTD: gabapentin (NEURONTIN) 300 MG capsule    Sig: Take 1 capsule (300 mg total) by mouth 2 (two) times daily as needed.    Dispense:  60 capsule    Refill:  5  . triamcinolone cream (KENALOG) 0.1 %    Sig: Apply 1 application topically 2 (two) times daily.    Dispense:  30 g    Refill:  0  . gabapentin (NEURONTIN) 300 MG capsule  Sig: Take 1 capsule (300 mg total) by mouth daily as needed.    Dispense:  90 capsule    Refill:  1   Patient Instructions    small rash or similar symptoms in the past, you can use the triamcinolone cream. However if spreading redness, or persistent rash, return here or other medical provider to take a look at that rash to make sure we are treating the correct problem.  You can also try Allegra or Claritin over-the-counter if you develop a rash in case this is allergic in cause.  I will refer you to a neurologist to determine other possible causes for the tingling/numbness in hands and feet, but okay to continue the Neurontin at same dose for now.   IF you received an x-ray today, you will receive an invoice from [facility]. Please contact [facility] at [phone] with questions or concerns regarding your invoice.   IF you received labwork today, you will receive an invoice from [facility]. Please contact [facility] at [phone] with questions or concerns regarding your invoice.   Our billing staff will not be able to assist you with questions regarding bills from these companies.  You will be contacted with the lab results as soon as they are available. The fastest way to get your results is to activate your My Chart account. Instructions are located on the last  page of this paperwork. If you have not heard from [place] regarding the results in 2 weeks, please contact this office.

## 2016-01-22 ENCOUNTER — Ambulatory Visit (INDEPENDENT_AMBULATORY_CARE_PROVIDER_SITE_OTHER): Payer: 59 | Admitting: Family Medicine

## 2016-01-22 ENCOUNTER — Other Ambulatory Visit: Payer: Self-pay | Admitting: Physician Assistant

## 2016-01-22 VITALS — BP 134/80 | HR 84 | Temp 99.0°F | Resp 16 | Ht 65.0 in | Wt 174.0 lb

## 2016-01-22 DIAGNOSIS — T63441A Toxic effect of venom of bees, accidental (unintentional), initial encounter: Secondary | ICD-10-CM | POA: Diagnosis not present

## 2016-01-22 MED ORDER — CETIRIZINE HCL 10 MG PO TABS: 10.0000 mg | ORAL_TABLET | Freq: Every day | ORAL | 11 refills | Status: DC

## 2016-01-22 MED ORDER — MUPIROCIN 2 % EX OINT: 1.0000 "application " | TOPICAL_OINTMENT | Freq: Three times a day (TID) | CUTANEOUS | 0 refills | Status: DC

## 2016-01-22 MED ORDER — RANITIDINE HCL 150 MG PO TABS: 150.0000 mg | ORAL_TABLET | Freq: Two times a day (BID) | ORAL | 0 refills | Status: DC

## 2016-01-22 MED ORDER — DOXYCYCLINE HYCLATE 100 MG PO CAPS: 100.0000 mg | ORAL_CAPSULE | Freq: Two times a day (BID) | ORAL | 0 refills | Status: DC

## 2016-01-22 MED ORDER — EPINEPHRINE 0.3 MG/0.3ML IJ SOAJ: 0.3000 mg | Freq: Once | INTRAMUSCULAR | 1 refills | Status: AC

## 2016-01-22 NOTE — Patient Instructions (Addendum)
IF you received an x-ray today, you will receive an invoice from [facility]. Please contact [facility] at [phone] with questions or concerns regarding your invoice.   IF you received labwork today, you will receive an invoice from [employer]. Please contact [facility] at [phone] with questions or concerns regarding your invoice.   Our billing staff will not be able to assist you with questions regarding bills from these companies.  You will be contacted with the lab results as soon as they are available. The fastest way to get your results is to activate your My Chart account. Instructions are located on the last page of this paperwork. If you have not heard from [place] regarding the results in 2 weeks, please contact this office.     Epinephrine Injection Epinephrine is a medicine given by injection to temporarily treat an emergency allergic reaction. It is also used to treat severe asthmatic attacks and other lung problems. The medicine helps to enlarge (dilate) the small breathing tubes of the lungs. A life-threatening, sudden allergic reaction that involves the whole body is called anaphylaxis. Because of potential side effects, epinephrine should only be used as directed by your caregiver. RISKS AND COMPLICATIONS Possible side effects of epinephrine injections include:  Chest pain.  Irregular or rapid heartbeat.  Shortness of breath.  Nausea.  Vomiting.  Abdominal pain or cramping.  Sweating.  Dizziness.  Weakness.  Headache.  Nervousness. Report all side effects to your caregiver. HOW TO GIVE AN EPINEPHRINE INJECTION Give the epinephrine injection immediately when symptoms of a severe reaction begin. Inject the medicine into the outer thigh or any available, large muscle. Your caregiver can teach you how to do this. You do not need to remove any clothing. After the injection, call your local emergency services  (911 in [place] Even if you improve after the injection, you need to be examined at a hospital emergency department. Epinephrine works quickly, but it also wears off quickly. Delayed reactions can occur. A delayed reaction may be as serious and dangerous as the initial reaction. HOME CARE INSTRUCTIONS  Make sure you and your family know how to give an epinephrine injection.  Use epinephrine injections as directed by your caregiver. Do not use this medicine more often or in larger doses than prescribed.  Always carry your epinephrine injection or anaphylaxis kit with you. This can be lifesaving if you have a severe reaction.  Store the medicine in a cool, dry place. If the medicine becomes discolored or cloudy, dispose of it properly and replace it with new medicine.  Check the expiration date on your medicine. It may be unsafe to use medicines past their expiration date.  Tell your caregiver about any other medicines you are taking. Some medicines can react badly with epinephrine.  Tell your caregiver about any medical conditions you have, such as diabetes, high blood pressure (hypertension), heart disease, irregular heartbeats, or if you are pregnant. SEEK IMMEDIATE MEDICAL CARE IF:  You have used an epinephrine injection. Call your local emergency services (911 in [place] Even if you improve after the injection, you need to be examined at a hospital emergency department to make sure your allergic reaction is under control. You will also be monitored for adverse effects from the medicine.  You have chest pain.  You have irregular or fast heartbeats.  You have shortness of breath.  You have severe headaches.  You have severe nausea, vomiting, or abdominal cramps.  You have severe  pain, swelling, or redness in the area where you gave the injection.   This information is not intended to replace advice given to you by your health care provider. Make sure you discuss any questions you have  with your health care provider.   Document Released: 06/13/2000 Document Revised: 09/08/2011 Document Reviewed: 01/03/2015 Elsevier Interactive Patient Education 2016 [place], [place], or [employer], wasps, and hornets are part of a family of insects that can sting people. These stings can cause pain and inflammation, but they are usually not serious. However, some people may have an allergic reaction to a sting. This can cause the symptoms to be more severe.  SYMPTOMS  Common symptoms of this condition include:   A red lump in the skin that sometimes has a tiny hole in the center. In some cases, a stinger may be in the center of the wound.  Pain and itching at the sting site.  Redness and swelling around the sting site. If you have an allergic reaction (localized allergic reaction), the swelling and redness may spread out from the sting site. In some cases, this reaction can continue to develop over the next 12-36 hours. In rare cases, a person may have a severe allergic reaction (anaphylactic reaction) to a sting. Symptoms of an anaphylactic reaction may include:   Wheezing or difficulty breathing.  Raised, itchy, red patches on the skin.  Nausea or vomiting.  Abdominal cramping.  Diarrhea.  Chest pain.  Fainting.  Redness of the face (flushing). DIAGNOSIS  This condition is usually diagnosed based on symptoms, medical history, and a physical exam. TREATMENT  Most stings can be treated with:   Icing to reduce swelling.  Medicines (antihistamines) to treat itching or an allergic reaction.  Medicines to help reduce pain. These may be medicines that you take by mouth, or medicated creams or lotions that you apply to your skin. If you were stung by a bee, the stinger and a small sac of poison may be in the wound. This may be removed by brushing across it with a flat card, such as a credit card. Another method is to pinch the area and pull it out. These methods  can help reduce the severity of the body's reaction to the sting.  HOME CARE INSTRUCTIONS   Wash the sting site daily with soap and water as told by your health care provider.  Apply or take over-the-counter and prescription medicines only as told by your health care provider.  If directed, apply ice to the sting area.  Put ice in a plastic bag.  Place a towel between your skin and the bag.  Leave the ice on for 20 minutes, 2-3 times per day.  Do not scratch the sting area.  To lessen pain, try using a paste that is made of water and baking soda. Rub the paste on the sting area and leave it on for 5 minutes.  If you had a severe allergic reaction to a sting, you may need:  To wear a medical bracelet or necklace that lists the allergy.  To learn when and how to use an anaphylaxis kit or epinephrine injection. Your family members may also need to learn this.  To carry an anaphylaxis kit with you at all times. SEEK MEDICAL CARE IF:   Your symptoms do not get better in 2-3 days.  You have redness, swelling, or pain that spreads beyond the area of the sting.  You have a fever. SEEK  IMMEDIATE MEDICAL CARE IF:  You have symptoms of a severe allergic reaction. These include:   Wheezing or difficulty breathing.  Chest pain.  Light-headedness or fainting.  Itchy, raised, red patches on the skin.  Nausea or vomiting.  Abdominal cramping.  Diarrhea.   This information is not intended to replace advice given to you by your health care provider. Make sure you discuss any questions you have with your health care provider.   Document Released: 06/16/2005 Document Revised: 03/07/2015 Document Reviewed: 11/01/2014 Elsevier Interactive Patient Education [employer].

## 2016-01-26 NOTE — Progress Notes (Signed)
By signing my name below I, Shelah Lewandowsky, attest that this documentation has been prepared under the direction and in the presence of Norberto Sorenson, MD. Electonically Signed. Shelah Lewandowsky, Scribe 01/26/2016 at 3:02 PM   Subjective:    Patient ID: Ann Sampson, female    DOB: Aug 14, 1975, 40 y.o.   MRN: [id number]  Chief Complaint  Patient presents with  . Insect Bite    last night on Rt Arm  . Medication Refill    epipen    Medication Refill  Pertinent negatives include no chest pain, fever, headaches, nausea, sore throat or vomiting.   Ann Sampson is a 40 y.o. female who presents to the [facility] complaining of yellow jacket sting on her rt forearm that occurred last night while pt was watering her plants outside at home. 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 Pt immediately used her epi-pen and took 2 benadryl last night. Pt denies any face swelling. Pt denies any SOB.   Pt c/o increased warm, redness and swelling of her rt forearm. Pt is concerned for possible infection at sting site. Pt also reports that she thinks her tongue is mildly swollen today.      There are no active problems to display for this patient.   Current Outpatient Prescriptions on File Prior to Visit  Medication Sig Dispense Refill  . Ascorbic Acid (VITAMIN C) 100 MG tablet Take 100 mg by mouth daily.    Marland Kitchen gabapentin (NEURONTIN) 300 MG capsule Take 1 capsule (300 mg total) by mouth daily as needed. 90 capsule 1  . Multiple Vitamins-Minerals (ADULT GUMMY PO) Take by mouth.    . triamcinolone cream (KENALOG) 0.1 % Apply 1 application topically 2 (two) times daily. 30 g 0   No current facility-administered medications on file prior to visit.     Allergies  Allergen Reactions  . Bextra [Valdecoxib]   . Flagyl [Metronidazole]   . Prednisone     Anaphylaxis   . Vioxx [Rofecoxib]     Depression screen [facility] 2/9 01/22/2016 01/17/2016 03/15/2015 07/10/2014  Decreased  Interest 0 0 0 0  Down, Depressed, Hopeless 0 0 0 0  PHQ - 2 Score 0 0 0 0       Review of Systems  Constitutional: Negative for fever.  HENT: Negative for facial swelling and sore throat.        Pt positive for mild tongue swelling.  Eyes: Negative for visual disturbance.  Respiratory: Negative for chest tightness and shortness of breath.   Cardiovascular: Negative for chest pain.  Gastrointestinal: Negative for nausea and vomiting.  Genitourinary: Negative for dysuria.  Musculoskeletal: Negative for back pain.  Skin: Positive for wound (rt forearm, bee sting red and inflammed).  Neurological: Negative for headaches.  Psychiatric/Behavioral: Negative for agitation.       Objective:  Physical Exam  Constitutional: She is oriented to person, place, and time. She appears well-developed and well-nourished. No distress.  HENT:  Head: Normocephalic and atraumatic.  Nose: Nose normal.  Mouth/Throat: Oropharynx is clear and moist. No oropharyngeal exudate or posterior oropharyngeal erythema.  Oropharynx with slight, 1+ edema.  Eyes: Conjunctivae are normal. Pupils are equal, round, and reactive to light.  Neck: Neck supple. No thyromegaly present.  Cardiovascular: Normal rate, regular rhythm and normal heart sounds.  Exam reveals no gallop and no friction rub.   No murmur heard. Pulmonary/Chest: Effort normal and breath sounds normal. No accessory muscle usage. No respiratory  distress. She has no decreased breath sounds. She has no wheezes. She has no rhonchi. She has no rales.  Musculoskeletal: Normal range of motion.  Lymphadenopathy:    She has cervical adenopathy (bilat, anteriorly).  Neurological: She is alert and oriented to person, place, and time.  Skin: Skin is warm and dry.  Pt has an area of redness and increased warmth that is 10cm by 18cm and on the ulnar aspect of her rt forearm. Area has central area of induration.  Psychiatric: She has a normal mood and affect. Her  behavior is normal.  Nursing note and vitals reviewed.  BP 134/80 (BP Location: Left Arm, Patient Position: Sitting, Cuff Size: Large)   Pulse 84   Temp 99 F (37.2 C) (Oral)   Resp 16   Ht  (1.651 m)   Wt 174 lb (78.9 kg)   LMP 01/13/2016   BMI 28.96 kg/m         Assessment & Plan:   1. Bee sting reaction, accidental or unintentional, initial encounter   Cover for cellulitis sequelae, warm compresses Advised to call 911 whenever she uses her epi-pen going forward  Meds ordered this encounter  Medications  . cetirizine (ZYRTEC) 10 MG tablet    Sig: Take 1 tablet (10 mg total) by mouth at bedtime.    Dispense:  30 tablet    Refill:  11  . doxycycline (VIBRAMYCIN) 100 MG capsule    Sig: Take 1 capsule (100 mg total) by mouth 2 (two) times daily.    Dispense:  14 capsule    Refill:  0  . mupirocin ointment (BACTROBAN) 2 %    Sig: Apply 1 application topically 3 (three) times daily.    Dispense:  30 g    Refill:  0  . EPINEPHrine (EPIPEN 2-PAK) 0.3 mg/0.3 mL IJ SOAJ injection    Sig: Inject 0.3 mLs (0.3 mg total) into the skin once.    Dispense:  2 Device    Refill:  1  . ranitidine (ZANTAC) 150 MG tablet    Sig: Take 1 tablet (150 mg total) by mouth 2 (two) times daily.    Dispense:  60 tablet    Refill:  0    I personally performed the services described in this documentation, which was scribed in my presence. The recorded information has been reviewed and considered, and addended by me as needed.   Norberto Sorenson, M.D.  [facility] [address] [place], [place] [address] [phone] phone [phone] fax  01/26/16 10:05 PM

## 2016-02-26 ENCOUNTER — Ambulatory Visit (INDEPENDENT_AMBULATORY_CARE_PROVIDER_SITE_OTHER): Payer: 59 | Admitting: Neurology

## 2016-02-26 ENCOUNTER — Encounter: Payer: Self-pay | Admitting: Neurology

## 2016-02-26 VITALS — BP 128/90 | HR 85 | Ht 65.0 in | Wt 168.0 lb

## 2016-02-26 DIAGNOSIS — G909 Disorder of the autonomic nervous system, unspecified: Secondary | ICD-10-CM

## 2016-02-26 DIAGNOSIS — I7389 Other specified peripheral vascular diseases: Secondary | ICD-10-CM | POA: Diagnosis not present

## 2016-02-26 NOTE — Patient Instructions (Signed)
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 I recommend checking neuropathy panel labs, HIV, Sjogren's antibodies. Continue gabapentin 300 mg at night since she seems to getting good results from it. I advised her to wear gloves and stockings during cold weather. She will return for follow-up in 2 months or call earlier if necessary.  Peripheral Neuropathy Peripheral neuropathy is a type of nerve damage. It affects nerves that carry signals between the spinal cord and other parts of the body. These are called peripheral nerves. With peripheral neuropathy, one nerve or a group of nerves may be damaged.  CAUSES  Many things can damage peripheral nerves. For some people with peripheral neuropathy, the cause is unknown. Some causes include:  Diabetes. This is the most common cause of peripheral neuropathy.  Injury to a nerve.  Pressure or stress on a nerve that lasts a long time.  Too little vitamin B. Alcoholism can lead to this.  Infections.  Autoimmune diseases, such as multiple sclerosis and systemic lupus erythematosus.  Inherited nerve diseases.  Some medicines, such as cancer drugs.  Toxic substances, such as lead and mercury.  Too little blood flowing to the legs.  Kidney disease.  Thyroid disease. SIGNS AND SYMPTOMS  Different people have different symptoms. The symptoms you have will depend on which of your nerves is damaged. Common symptoms include:  Loss of feeling (numbness) in the feet and hands.  Tingling in the feet and hands.  Pain that burns.  Very sensitive skin.  Weakness.  Not being able to move a part of the body (paralysis).  Muscle twitching.  Clumsiness or poor coordination.  Loss of balance.  Not being able to control your bladder.  Feeling dizzy.  Sexual problems. DIAGNOSIS  Peripheral neuropathy is a symptom, not a  disease. Finding the cause of peripheral neuropathy can be hard. To figure that out, your health care provider will take a medical history and do a physical exam. A neurological exam will also be done. This involves checking things affected by your brain, spinal cord, and nerves (nervous system). For example, your health care provider will check your reflexes, how you move, and what you can feel.  Other types of tests may also be ordered, such as:  Blood tests.  A test of the fluid in your spinal cord.  Imaging tests, such as CT scans or an MRI.  Electromyography (EMG). This test checks the nerves that control muscles.  Nerve conduction velocity tests. These tests check how fast messages pass through your nerves.  Nerve biopsy. A small piece of nerve is removed. It is then checked under a microscope. TREATMENT   Medicine is often used to treat peripheral neuropathy. Medicines may include:  Pain-relieving medicines. Prescription or over-the-counter medicine may be suggested.  Antiseizure medicine. This may be used for pain.  Antidepressants. These also may help ease pain from neuropathy.  Lidocaine. This is a numbing medicine. You might wear a patch or be given a shot.  Mexiletine. This medicine is typically used to help control irregular heart rhythms.  Surgery. Surgery may be needed to relieve pressure on a nerve or to destroy a nerve that is causing pain.  Physical therapy to help movement.  Assistive devices to help movement. HOME CARE INSTRUCTIONS   Only take over-the-counter or prescription medicines as directed by your health care provider. Follow the instructions carefully  for any given medicines. Do not take any other medicines without first getting approval from your health care provider.  If you have diabetes, work closely with your health care provider to keep your blood sugar under control.  If you have numbness in your feet:  Check every day for signs of injury  or infection. Watch for redness, warmth, and swelling.  Wear padded socks and comfortable shoes. These help protect your feet.  Do not do things that put pressure on your damaged nerve.  Do not smoke. Smoking keeps blood from getting to damaged nerves.  Avoid or limit alcohol. Too much alcohol can cause a lack of B vitamins. These vitamins are needed for healthy nerves.  Develop a good support system. Coping with peripheral neuropathy can be stressful. Talk to a mental health specialist or join a support group if you are struggling.  Follow up with your health care provider as directed. SEEK MEDICAL CARE IF:   You have new signs or symptoms of peripheral neuropathy.  You are struggling emotionally from dealing with peripheral neuropathy.  You have a fever. SEEK IMMEDIATE MEDICAL CARE IF:   You have an injury or infection that is not healing.  You feel very dizzy or begin vomiting.  You have chest pain.  You have trouble breathing.   This information is not intended to replace advice given to you by your health care provider. Make sure you discuss any questions you have with your health care provider.   Document Released: 06/06/2002 Document Revised: 02/26/2011 Document Reviewed: 02/21/2013 Elsevier Interactive Patient Education [employer]2016 [employer] Inc[employer]

## 2016-02-26 NOTE — Progress Notes (Signed)
[facility] [address] [place] [id number] [phone]       OFFICE CONSULT NOTE  Ann. Ann Sampson Date of Birth:  03/19/1976 Medical Record Number:  [id number]   Referring MD:  Neva SeatGreene  Reason for Referral: neuropathy  HPI: Ann Sampson is a [age] year pleasant Caucasian lady whose had pins and needles sensations in her feet for the last 3 years. Gradually this seems to have progressed to involve her fingertips and hands as well. She describes this as being intermittent most bothersome at night. She denies any lack of sensation. There is no significant weakness. She mostly has no trouble walking. On the during the winter she has some trouble climbing down steps. She has been taking gabapentin 300 mg at night which seems to work quite well. She could not tolerate 600 mg as it made her quite loopy and sleepy during the day. She seems quite happy and content with the present relief she has a gabapentin and is not interested in taking a daytime dose. She denies any symptoms of orthostasis, dizziness, syncope. She has no problems controlling her bladder or have any abnormal sweating. She was seen by rheumatologist whom she is unable to name. She underwent lab work which was essentially all unremarkable. 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 She denies any significant neck pain, injury, fall, trouble with balance or walking. She has no known history of diabetes. She does not drink alcohol. She has no history of exposure to toxins. She has not been on any chemotherapy.  ROS:   14 system review of systems is positive for  tingling, numbness, skin sensitivity, allergies and all other systems negative  PMH:  Past Medical History:  Diagnosis Date  . Allergy   . Arthritis   . Asthma   . Depression     Social History:  Social History   Social History  . Marital status: Divorced    Spouse name: N/A  . Number of children: N/A   . Years of education: N/A   Occupational History  . Not on file.   Social History Main Topics  . Smoking status: Former Games developermoker  . Smokeless tobacco: Never Used  . Alcohol use 0.6 oz/week    1 Glasses of wine per week     Comment: occcasionally  . Drug use: No  . Sexual activity: Not on file   Other Topics Concern  . Not on file   Social History Narrative  . No narrative on file    Medications:   Current Outpatient Prescriptions on File Prior to Visit  Medication Sig Dispense Refill  . Ascorbic Acid (VITAMIN C) 100 MG tablet Take 100 mg by mouth daily.    . cetirizine (ZYRTEC) 10 MG tablet Take 1 tablet (10 mg total) by mouth at bedtime. 30 tablet 11  . gabapentin (NEURONTIN) 300 MG capsule Take 1 capsule (300 mg total) by mouth daily as needed. (Patient taking differently: Take 300 mg by mouth daily. ) 90 capsule 1  . Multiple Vitamins-Minerals (ADULT GUMMY PO) Take by mouth.    . triamcinolone cream (KENALOG) 0.1 % Apply 1 application topically 2 (two) times daily. 30 g 0   No current facility-administered medications on file prior to visit.     Allergies:   Allergies  Allergen Reactions  . Bextra [Valdecoxib]   . Flagyl [Metronidazole]   . Prednisone     Anaphylaxis   . Vioxx [  Rofecoxib]     Physical Exam General: well developed, well nourished middle-age Caucasian lady, seated, in no evident distress Head: head normocephalic and atraumatic.   Neck: supple with no carotid or supraclavicular bruits Cardiovascular: regular rate and rhythm, no murmurs Musculoskeletal: no deformity Skin:  no rash/petichiae Vascular:  Normal pulses all extremities  Neurologic Exam Mental Status: Awake and fully alert. Oriented to place and time. Recent and remote memory intact. Attention span, concentration and fund of knowledge appropriate. Mood and affect appropriate.  Cranial Nerves: Fundoscopic exam reveals sharp disc margins. Pupils equal, briskly reactive to light.  Extraocular movements full without nystagmus. Visual fields full to confrontation. Hearing intact. Facial sensation intact. Face, tongue, palate moves normally and symmetrically.  Motor: Normal bulk and tone. Normal strength in all tested extremity muscles. Sensory.: intact to touch , pinprick , position  But diminished vibratory sensation. Over the lateral 3 toes bilaterally. Slight hyperesthesia over the dorsum of feet bilaterally Coordination: Rapid alternating movements normal in all extremities. Finger-to-nose and heel-to-shin performed accurately bilaterally. Gait and Station: Arises from chair without difficulty. Stance is normal. Gait demonstrates normal stride length and balance . Able to heel, toe and tandem walk without difficulty.  Reflexes: 1+ and symmetric. Toes downgoing.       ASSESSMENT: 63 year Caucasian lady with 3 year history of acral 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 I recommend checking neuropathy panel labs, HIV, Sjogren's antibodies. Continue gabapentin 300 mg at night since she seems to getting good results from it. I advised her to wear gloves and stockings during cold weather. Greater than 50% time during this 45 minute consultation visit was spent on counseling and coordination of care about paresthesias, neuropathy and answering questions She will return for follow-up in 2 months or call earlier if necessary. Delia Heady, MD  [facility] [address] [place], [place] [address]  Phone [phone] Fax [phone] Note: This document was prepared with digital dictation and possible smart phrase technology. Any  transcriptional errors that result from this process are unintentional.

## 2016-02-28 LAB — NEUROPATHY PANEL
A/G Ratio: 1.8 — ABNORMAL HIGH (ref 0.7–1.7)
ALBUMIN ELP: 4.2 g/dL (ref 2.9–4.4)
ALPHA 1: 0.2 g/dL (ref 0.0–0.4)
ANA: NEGATIVE
ANGIO CONVERT ENZYME: 32 U/L (ref 14–82)
Alpha 2: 0.5 g/dL (ref 0.4–1.0)
Beta: 0.8 g/dL (ref 0.7–1.3)
Gamma Globulin: 0.8 g/dL (ref 0.4–1.8)
Globulin, Total: 2.3 g/dL (ref 2.2–3.9)
PDF: 0
SED RATE: 2 mm/h (ref 0–32)
TSH: 1.67 u[IU]/mL (ref 0.450–4.500)
Total Protein: 6.5 g/dL (ref 6.0–8.5)
VITAMIN B 12: 602 pg/mL (ref 211–946)
Vit D, 25-Hydroxy: 45.1 ng/mL (ref 30.0–100.0)

## 2016-02-28 LAB — SJOGREN'S SYNDROME ANTIBODS(SSA + SSB)

## 2016-02-28 LAB — HIV ANTIBODY (ROUTINE TESTING W REFLEX): HIV SCREEN 4TH GENERATION: NONREACTIVE

## 2016-02-29 ENCOUNTER — Telehealth: Payer: Self-pay | Admitting: *Deleted

## 2016-02-29 NOTE — Telephone Encounter (Signed)
Per Dr Pearlean BrownieSethi, spoke with patient and informed her that her blood work for causes of neuropathy was all normal.  She verbalized understanding, appreciation and had no questions.

## 2016-03-26 ENCOUNTER — Ambulatory Visit (INDEPENDENT_AMBULATORY_CARE_PROVIDER_SITE_OTHER): Payer: Self-pay | Admitting: Neurology

## 2016-03-26 ENCOUNTER — Ambulatory Visit (INDEPENDENT_AMBULATORY_CARE_PROVIDER_SITE_OTHER): Payer: 59 | Admitting: Neurology

## 2016-03-26 DIAGNOSIS — R202 Paresthesia of skin: Secondary | ICD-10-CM

## 2016-03-26 DIAGNOSIS — G909 Disorder of the autonomic nervous system, unspecified: Secondary | ICD-10-CM

## 2016-03-26 DIAGNOSIS — Z0289 Encounter for other administrative examinations: Secondary | ICD-10-CM

## 2016-03-26 NOTE — Procedures (Signed)
[facility]    Provider:  Dr Lucia GaskinsAhern Referring Provider: Shade FloodGreene, Jeffrey R, MD Primary Care Physician:  Shade FloodGREENE,JEFFREY R, MD  History:  Ann Sampson is a 40 y.o. female here for evaluation of pins and needles in her feet for the last 3 years which has progressed to include her hands as well.   Summary  Nerve conduction studies were performed on the left upper and left lower extremities:  Left Median motor nerve showed normal conductions with normal F Wave latency Left Ulnar motor nerves showed normal conductions with normal F Wave latency Left Peroneal motor nerve showed normal conductions with normal F Wave latency Left Tibial motor nerve showed normal conductions with normal F Wave latency Left second-digit Median sensory nerve conduction was within normal limits Left fifth-digit Ulnar sensory nerve conduction was within normal limits Left Superficial Peroneal sensory nerve conduction was within normal limits Left H Reflex showed normal latency  EMG Needle study was performed on selected left upper and left lower extremity muscles:   The Deltoid, Triceps, Pronator Teres, Opponens Pollicis, Flexor Digitorum Profundus, Vastus Medialis, Anterior Tibialis, Medial Gastrocnemius, Extensor Hallucis Longus and Abductor Hallucis muscles were within normal limits.  Conclusion: This is a normal study. No electrophysiologic evidence for large-fiber peripheral polyneuropathy, mononeuropathy, radiculopathy, or neuromuscular disorder. However a small fiber neuropathy could be responsible for patient's paresthesias and still evade detection by this study. Clinical correlation recommended.   Naomie DeanAntonia Ameyah Bangura, MD  [facility] [address] [place], [place] [address]  Phone [phone] Fax [phone]

## 2016-03-26 NOTE — Progress Notes (Signed)
[facility]    Provider:  Dr Gilford Lardizabal Referring Provider: Greene, Jeffrey R, MD Primary Care Physician:  GREENE,JEFFREY R, MD  History:  Ann Sampson is a 39 y.o. female here for evaluation of pins and needles in her feet for the last 3 years which has progressed to include her hands as well.   Summary  Nerve conduction studies were performed on the left upper and left lower extremities:  Left Median motor nerve showed normal conductions with normal F Wave latency Left Ulnar motor nerves showed normal conductions with normal F Wave latency Left Peroneal motor nerve showed normal conductions with normal F Wave latency Left Tibial motor nerve showed normal conductions with normal F Wave latency Left second-digit Median sensory nerve conduction was within normal limits Left fifth-digit Ulnar sensory nerve conduction was within normal limits Left Superficial Peroneal sensory nerve conduction was within normal limits Left H Reflex showed normal latency  EMG Needle study was performed on selected left upper and left lower extremity muscles:   The Deltoid, Triceps, Pronator Teres, Opponens Pollicis, Flexor Digitorum Profundus, Vastus Medialis, Anterior Tibialis, Medial Gastrocnemius, Extensor Hallucis Longus and Abductor Hallucis muscles were within normal limits.  Conclusion: This is a normal study. No electrophysiologic evidence for large-fiber peripheral polyneuropathy, mononeuropathy, radiculopathy, or neuromuscular disorder. However a small fiber neuropathy could be responsible for patient's paresthesias and still evade detection by this study. Clinical correlation recommended.   Marylan Glore, MD  [facility] [address] [place], [place] [address]  Phone [phone] Fax [phone]   [facility]    Provider:  Dr Lucia Gaskins Referring Provider: Shade Flood, MD Primary Care Physician:  Shade Flood, MD  History:  Ann Sampson is a 40 y.o. female here for evaluation of pins and needles in her feet for the last 3 years which has progressed to include her hands as well.   Summary  Nerve conduction studies were performed on the left upper and left lower extremities:  Left Median motor nerve showed normal conductions with normal F Wave latency Left Ulnar motor nerves showed normal conductions with normal F Wave latency Left Peroneal motor nerve showed normal conductions with normal F Wave latency Left Tibial motor nerve showed normal conductions with normal F Wave latency Left second-digit Median sensory nerve conduction was within normal limits Left fifth-digit Ulnar sensory nerve conduction was within normal limits Left Superficial Peroneal sensory nerve conduction was within normal limits Left H Reflex showed normal latency  EMG Needle study was performed on selected left upper and left lower extremity muscles:   The Deltoid, Triceps, Pronator Teres, Opponens Pollicis, Flexor Digitorum Profundus, Vastus Medialis, Anterior Tibialis, Medial Gastrocnemius, Extensor Hallucis Longus and Abductor Hallucis muscles were within normal limits.  Conclusion: This is a normal study. No electrophysiologic evidence for large-fiber peripheral polyneuropathy, mononeuropathy, radiculopathy, or neuromuscular disorder. However a small fiber neuropathy could be responsible for patient's paresthesias and still evade detection by this study. Clinical correlation recommended.   Naomie Dean, MD  [facility] [address] [place], [place] [address]  Phone [phone] Fax [phone]

## 2016-03-26 NOTE — Progress Notes (Signed)
See procedure note.

## 2016-04-09 ENCOUNTER — Telehealth: Payer: Self-pay | Admitting: Adult Health

## 2016-04-09 NOTE — Telephone Encounter (Signed)
I called patient. Advised NCS/EMG was normal. Patient verbalized understanding.

## 2016-05-07 ENCOUNTER — Ambulatory Visit (INDEPENDENT_AMBULATORY_CARE_PROVIDER_SITE_OTHER): Payer: 59 | Admitting: Neurology

## 2016-05-07 ENCOUNTER — Encounter: Payer: Self-pay | Admitting: Neurology

## 2016-05-07 VITALS — BP 117/82 | HR 84 | Ht 65.0 in | Wt 170.0 lb

## 2016-05-07 DIAGNOSIS — R202 Paresthesia of skin: Secondary | ICD-10-CM

## 2016-05-07 NOTE — Progress Notes (Signed)
[facility] [address] [place] [address] [phone]       OFFICE CONSULT NOTE  [name] Date of Birth:  07-28-1975 Medical Record Number:  [id number]   Referring MD:  Ann Sampson  Reason for Referral: neuropathy  HPI: Initial Consult 02/26/2016 ;[name] is a 72 year pleasant Caucasian lady whose had pins and needles sensations in her feet for the last 3 years. Gradually this seems to have progressed to involve her fingertips and hands as well. She describes this as being intermittent most bothersome at night. She denies any lack of sensation. There is no significant weakness. She mostly has no trouble walking. On the during the winter she has some trouble climbing down steps. She has been taking gabapentin 300 mg at night which seems to work quite well. She could not tolerate 600 mg as it made her quite loopy and sleepy during the day. She seems quite happy and content with the present relief she has a gabapentin and is not interested in taking a daytime dose. She denies any symptoms of orthostasis, dizziness, syncope. She has no problems controlling her bladder or have any abnormal sweating. She was seen by rheumatologist whom she is unable to name. She underwent lab work which was essentially all unremarkable. 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 She denies any significant neck pain, injury, fall, trouble with balance or walking. She has no known history of diabetes. She does not drink alcohol. She has no history of exposure to toxins. She has not been on any chemotherapy. Update 05/07/2016 : She returns for follow-up after last visit 2 months ago. She continues to have tingling and numbness as well as pins and needle sensations in her feet and fingertips which are unchanged. She does take gabapentin 300 mg at night which seems to work quite well. She does not have enough bothersome symptoms in the daytime to try  the medication. She did undergo no conduction EMG study on 02/2716 which was normal without evidence of neuropathy or radiculopathy. Lab work done on 02/26/16 for HIV, Chardon syndrome, vitamin B12, TSH, vitamin D, M protein, ANA, rheumatoid factor, he is levels and ESR were all normal. Patient denies any symptoms of orthostasis, abnormal sweating, bladder or bowel control issues. She denies any difficulty with walking or balance or falling down. She states her weight has been steady and she has not lost her appetite or weight. She has no new complaints today. ROS:   14 system review of systems is positive for  tingling, numbness,  and all other systems negative  PMH:  Past Medical History:  Diagnosis Date  . Allergy   . Arthritis   . Asthma   . Depression     Social History:  Social History   Social History  . Marital status: Divorced    Spouse name: N/A  . Number of children: N/A  . Years of education: N/A   Occupational History  . Not on file.   Social History Main Topics  . Smoking status: Former Research scientist (life sciences)  . Smokeless tobacco: Never Used  . Alcohol use 0.6 oz/week    1 Glasses of wine per week     Comment: occcasionally  . Drug use: No  . Sexual activity: Not on file   Other Topics Concern  . Not on file   Social History Narrative  . No narrative on file    Medications:   Current  Outpatient Prescriptions on File Prior to Visit  Medication Sig Dispense Refill  . Ascorbic Acid (VITAMIN C) 100 MG tablet Take 100 mg by mouth daily.    . cetirizine (ZYRTEC) 10 MG tablet Take 1 tablet (10 mg total) by mouth at bedtime. 30 tablet 11  . gabapentin (NEURONTIN) 300 MG capsule Take 1 capsule (300 mg total) by mouth daily as needed. (Patient taking differently: Take 300 mg by mouth daily. ) 90 capsule 1  . Multiple Vitamins-Minerals (ADULT GUMMY PO) Take by mouth.    . triamcinolone cream (KENALOG) 0.1 % Apply 1 application topically 2 (two) times daily. 30 g 0   No current  facility-administered medications on file prior to visit.     Allergies:   Allergies  Allergen Reactions  . Bextra [Valdecoxib]   . Flagyl [Metronidazole]   . Prednisone     Anaphylaxis   . Vioxx [Rofecoxib]     Physical Exam General: well developed, well nourished middle-age Caucasian lady, seated, in no evident distress Head: head normocephalic and atraumatic.   Neck: supple with no carotid or supraclavicular bruits Cardiovascular: regular rate and rhythm, no murmurs Musculoskeletal: no deformity Skin:  no rash/petichiae Vascular:  Normal pulses all extremities  Neurologic Exam Mental Status: Awake and fully alert. Oriented to place and time. Recent and remote memory intact. Attention span, concentration and fund of knowledge appropriate. Mood and affect appropriate.  Cranial Nerves: Fundoscopic exam not done Pupils equal, briskly reactive to light. Extraocular movements full without nystagmus. Visual fields full to confrontation. Hearing intact. Facial sensation intact. Face, tongue, palate moves normally and symmetrically.  Motor: Normal bulk and tone. Normal strength in all tested extremity muscles. Sensory.: intact to touch , pinprick , position  But diminished vibratory sensation. Over the lateral 3 toes bilaterally. Slight hyperesthesia over the dorsum of feet bilaterally Coordination: Rapid alternating movements normal in all extremities. Finger-to-nose and heel-to-shin performed accurately bilaterally. Gait and Station: Arises from chair without difficulty. Stance is normal. Gait demonstrates normal stride length and balance . Able to heel, toe and tandem walk without difficulty.  Reflexes: 1+ and symmetric. Toes downgoing.       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     PLAN: I had a long discussion with the patient with regards to her  tingling and numbness in  extremities which likely from small fibre neuropathy.and discussed the results of the  labwork and nerve conduction study evaluation and answered questions. I recommend she continue gabapentin 300 mg at night as it seems to be working quite well. She does not have significant symptoms during the daytime to justify taking additional dose during the day. The patient is reluctant to consider a nerve and skin biopsy to confirm small fiber neuropathy I do not believe further testing is necessary. She will return for follow-up in 6 months with my nurse practitioner or call earlier if necessary. I advised her to wear gloves and stockings during cold weather. Greater than 50% time during this 45 minute consultation visit was spent on counseling and coordination of care about paresthesias, neuropathy and answering questions She will return for follow-up in 2 months or call earlier if necessary. Antony Contras, MD  [facility] [address] [place], [place] [address]  Phone [phone] Fax [phone] Note: This document was prepared with digital dictation and possible smart phrase technology. Any transcriptional errors that result from this process are  unintentional.

## 2016-09-23 ENCOUNTER — Other Ambulatory Visit: Payer: Self-pay | Admitting: Family Medicine

## 2016-09-23 DIAGNOSIS — R200 Anesthesia of skin: Secondary | ICD-10-CM

## 2016-09-23 DIAGNOSIS — R202 Paresthesia of skin: Secondary | ICD-10-CM

## 2016-09-25 ENCOUNTER — Other Ambulatory Visit: Payer: Self-pay | Admitting: Physician Assistant

## 2016-09-25 ENCOUNTER — Ambulatory Visit (INDEPENDENT_AMBULATORY_CARE_PROVIDER_SITE_OTHER): Payer: 59 | Admitting: Physician Assistant

## 2016-09-25 ENCOUNTER — Telehealth: Payer: Self-pay | Admitting: Physician Assistant

## 2016-09-25 VITALS — BP 130/86 | HR 107 | Temp 98.4°F | Resp 18 | Ht 65.0 in | Wt 175.0 lb

## 2016-09-25 DIAGNOSIS — R928 Other abnormal and inconclusive findings on diagnostic imaging of breast: Secondary | ICD-10-CM

## 2016-09-25 DIAGNOSIS — N644 Mastodynia: Secondary | ICD-10-CM

## 2016-09-25 DIAGNOSIS — L989 Disorder of the skin and subcutaneous tissue, unspecified: Secondary | ICD-10-CM

## 2016-09-25 LAB — POCT CBC
Granulocyte percent: 68.1 %G (ref 37–80)
HCT, POC: 43.6 % (ref 37.7–47.9)
HEMOGLOBIN: 15.3 g/dL (ref 12.2–16.2)
Lymph, poc: 2 (ref 0.6–3.4)
MCH: 32.3 pg — AB (ref 27–31.2)
MCHC: 35.1 g/dL (ref 31.8–35.4)
MCV: 91.9 fL (ref 80–97)
MID (cbc): 0.2 (ref 0–0.9)
MPV: 7.4 fL (ref 0–99.8)
PLATELET COUNT, POC: 262 10*3/uL (ref 142–424)
POC Granulocyte: 4.6 (ref 2–6.9)
POC LYMPH PERCENT: 29.2 %L (ref 10–50)
POC MID %: 2.7 %M (ref 0–12)
RBC: 4.75 M/uL (ref 4.04–5.48)
RDW, POC: 12.3 %
WBC: 6.8 10*3/uL (ref 4.6–10.2)

## 2016-09-25 NOTE — Patient Instructions (Addendum)
For breast pain, I would like you to keep an eye out for a rash over the next few days and contact me if you notice one. Try taking OTC ibuprofen around the clock and see if that helps with the discomfort. Your mammogram info is below. If you develop any worsening pressure, fever, chills, exertional chest pain, nausea, vomiting, or shortness of breath, please seek care immediately at the ED.   For the leg lesion, I would like you to see dermatology for further evaluation .  Mammogram and Breast Ultrasound on Monday, April 2nd, arrive at 9:50 for registration. [facility]  [address]  [phone]

## 2016-09-25 NOTE — Progress Notes (Signed)
Ann Sampson  MRN: [id number] DOB: 10/29/1975  Subjective:  Ann Sampson is a 41 y.o. female seen in office today for a chief complaint of left breast discomfort x 3 weeks. Describes it has an engorgement pressure sensation not a pain. The sensation started out as a generalized left sided breast sensitivity but has progressed to a localized discomfort. Denies nipple discharge, redness, warmth, fever, and chills. She has not tried anything for relief. Of note, she has breast implants, got them roughly 10 years ago. She is unsure if they are saline or silicone. Last mammogram was 4 years ago, it was normal. LMP 09/24/16. Denies smoking. Has no FH of breast cancer.   Pt would also like referral to derm for a particular skin lesion on her leg. Notes it has been there for 4 years and has not caused any issues but she is just curious as to what it is and if she should be worried about it.   Review of Systems  Constitutional: Negative for fatigue and unexpected weight change.  Respiratory: Negative for cough and shortness of breath.   Cardiovascular: Negative for chest pain, palpitations and leg swelling.  Skin: Negative for rash.  Psychiatric/Behavioral: The patient is nervous/anxious.     There are no active problems to display for this patient.   Current Outpatient Prescriptions on File Prior to Visit  Medication Sig Dispense Refill  . Ascorbic Acid (VITAMIN C) 100 MG tablet Take 100 mg by mouth daily.    Marland Kitchen. gabapentin (NEURONTIN) 300 MG capsule TAKE 1 CAPSULE DAILY AS NEEDED. 25 capsule 0  . Multiple Vitamins-Minerals (ADULT GUMMY PO) Take by mouth.    . triamcinolone cream (KENALOG) 0.1 % Apply 1 application topically 2 (two) times daily. 30 g 0  . cetirizine (ZYRTEC) 10 MG tablet Take 1 tablet (10 mg total) by mouth at bedtime. (Patient not taking: Reported on 09/25/2016) 30 tablet 11   No current facility-administered medications on file prior to visit.     Allergies  Allergen  Reactions  . Bextra [Valdecoxib]   . Flagyl [Metronidazole]   . Prednisone     Anaphylaxis   . Vioxx [Rofecoxib]      Objective:  BP 130/86   Pulse (!) 107   Temp 98.4 F (36.9 C) (Oral)   Resp 18   Ht 5\' 5"  (1.651 m)   Wt 175 lb (79.4 kg)   LMP 09/25/2016   SpO2 99%   BMI 29.12 kg/m   Physical Exam  Constitutional: She is oriented to person, place, and time and well-developed, well-nourished, and in no distress.  HENT:  Head: Normocephalic and atraumatic.  Eyes: Conjunctivae are normal.  Neck: Normal range of motion.  Cardiovascular: Normal rate, regular rhythm, normal heart sounds and intact distal pulses.   Pulmonary/Chest: Effort normal and breath sounds normal. She exhibits no tenderness and no bony tenderness. Right breast exhibits no inverted nipple, no mass, no nipple discharge, no skin change and no tenderness. Left breast exhibits tenderness. Left breast exhibits no inverted nipple, no mass and no nipple discharge. Breasts are symmetrical.    Musculoskeletal:       Right lower leg: She exhibits no swelling.       Left lower leg: She exhibits no swelling.  Neurological: She is alert and oriented to person, place, and time. Gait normal.  Skin: Skin is warm and dry.  One 3 mm reddish/brown papule noted on lateral aspect of thigh. No surrounding erythema, warmth, induration, or  discharge noted.   Psychiatric: Affect normal.  Vitals reviewed.  No results found for this or any previous visit (from the past 24 hour(s)).  Assessment and Plan :  1. Breast pain Imaging scheduled for Monday, April 2nd at 9:50am. Instructed to use OTC NSAIDS in the meantime and see fi this provides any relief. If anything worsens while awaiting imaging, please return or seek care at ED immediately.  - POCT CBC US BREAST LTD UNI LEFT INC AXILLA  MR BREAST BILATERAL W WO CONTRAST   2. Skin lesion - Ambulatory referral to Dermatology  Benjiman Core PA-C  [facility] 09/26/2016 3:43 PM

## 2016-09-25 NOTE — Telephone Encounter (Signed)
I see 25 sent in today, can you send 30?

## 2016-09-25 NOTE — Telephone Encounter (Signed)
Holly from [facility] states they sent over a refill request for gabapentin 25 capsules but pt needs a 30 day capsule refill. She would like to know if you can resend the Rx refill for a 30 day supply?  Holly contact phone number [phone]

## 2016-09-26 NOTE — Telephone Encounter (Signed)
I have called [facility] and they state that the Rx states #30 capsules not #25 so I think it should be taken care of. Thanks!

## 2016-09-29 ENCOUNTER — Ambulatory Visit
Admission: RE | Admit: 2016-09-29 | Discharge: 2016-09-29 | Disposition: A | Discharge status: Home / Self Care | Payer: 59 | Source: Ambulatory Visit | Attending: Physician Assistant | Admitting: Physician Assistant

## 2016-09-29 DIAGNOSIS — R922 Inconclusive mammogram: Secondary | ICD-10-CM | POA: Diagnosis not present

## 2016-09-29 DIAGNOSIS — N644 Mastodynia: Secondary | ICD-10-CM

## 2016-10-09 DIAGNOSIS — D485 Neoplasm of uncertain behavior of skin: Secondary | ICD-10-CM | POA: Diagnosis not present

## 2016-10-09 DIAGNOSIS — D225 Melanocytic nevi of trunk: Secondary | ICD-10-CM | POA: Diagnosis not present

## 2016-10-09 DIAGNOSIS — C44719 Basal cell carcinoma of skin of left lower limb, including hip: Secondary | ICD-10-CM | POA: Diagnosis not present

## 2016-10-29 DIAGNOSIS — C44719 Basal cell carcinoma of skin of left lower limb, including hip: Secondary | ICD-10-CM | POA: Diagnosis not present

## 2016-11-04 ENCOUNTER — Encounter: Payer: Self-pay | Admitting: Nurse Practitioner

## 2016-11-04 ENCOUNTER — Ambulatory Visit (INDEPENDENT_AMBULATORY_CARE_PROVIDER_SITE_OTHER): Payer: 59 | Admitting: Nurse Practitioner

## 2016-11-04 DIAGNOSIS — R202 Paresthesia of skin: Secondary | ICD-10-CM | POA: Insufficient documentation

## 2016-11-04 DIAGNOSIS — R200 Anesthesia of skin: Secondary | ICD-10-CM

## 2016-11-04 DIAGNOSIS — G629 Polyneuropathy, unspecified: Secondary | ICD-10-CM | POA: Diagnosis not present

## 2016-11-04 MED ORDER — GABAPENTIN 300 MG PO CAPS: ORAL_CAPSULE | ORAL | 11 refills | Status: DC

## 2016-11-04 NOTE — Patient Instructions (Addendum)
Continue Gabapentin at current dose.refilled Follow up in 1 year

## 2016-11-04 NOTE — Progress Notes (Signed)
[facility]  PATIENT: Ann Sampson DOB: 11/03/1975   REASON FOR VISIT: Follow-up for paresthesias HISTORY FROM: Patient    HISTORY OF PRESENT ILLNESS:Initial Consult 8/29/2017PS ;[name] is a 71 year pleasant Caucasian lady whose had pins and needles sensations in her feet for the last 3 years. Gradually this seems to have progressed to involve her fingertips and hands as well. She describes this as being intermittent most bothersome at night. She denies any lack of sensation. There is no significant weakness. She mostly has no trouble walking. On the during the winter she has some trouble climbing down steps. She has been taking gabapentin 300 mg at night which seems to work quite well. She could not tolerate 600 mg as it made her quite loopy and sleepy during the day. She seems quite happy and content with the present relief she has a gabapentin and is not interested in taking a daytime dose. She denies any symptoms of orthostasis, dizziness, syncope. She has no problems controlling her bladder or have any abnormal sweating. She was seen by rheumatologist whom she is unable to name. She underwent lab work which was essentially all unremarkable. 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 She denies any significant neck pain, injury, fall, trouble with balance or walking. She has no known history of diabetes. She does not drink alcohol. She has no history of exposure to toxins. She has not been on any chemotherapy. Update 05/07/2016 PS: She returns for follow-up after last visit 2 months ago. She continues to have tingling and numbness as well as pins and needle sensations in her feet and fingertips which are unchanged. She does take gabapentin 300 mg at night which seems to work quite well. She does not have enough bothersome symptoms in the daytime to try the medication. She did undergo no conduction EMG study on 02/2716 which was normal  without evidence of neuropathy or radiculopathy. Lab work done on 02/26/16 for HIV, Chardon syndrome, vitamin B12, TSH, vitamin D, M protein, ANA, rheumatoid factor, he is levels and ESR were all normal. Patient denies any symptoms of orthostasis, abnormal sweating, bladder or bowel control issues. She denies any difficulty with walking or balance or falling down. She states her weight has been steady and she has not lost her appetite or weight. She has no new complaints today. UPDATE 05/08/2018CM [name], 41 year old female returns for follow-up with history of paresthesias in the feet and fingertips. She is taking gabapentin 300 at night which is working well for her symptoms.her EMG nerve conduction was normal without evidence of neuropathy or radiculopathy. Lab work to check for treatable causes of neuropathy all returned normal. She has had no problems with ambulation or falls. She continues to be very active. She returns for reevaluation  REVIEW OF SYSTEMS: Full 14 system review of systems performed and notable only for those listed, all others are neg:  Constitutional: neg  Cardiovascular: neg Ear/Nose/Throat: neg  Skin: neg Eyes: neg Respiratory: neg Gastroitestinal: neg  Hematology/Lymphatic: neg  Endocrine: neg Musculoskeletal:neg Allergy/Immunology: neg Neurological: neg Psychiatric: neg Sleep : neg   ALLERGIES: Allergies  Allergen Reactions  . Bextra [Valdecoxib]   . Flagyl [Metronidazole]   . Prednisone     Anaphylaxis   . Vioxx [Rofecoxib]     HOME MEDICATIONS: Outpatient Medications Prior to Visit  Medication Sig Dispense Refill  . cetirizine (ZYRTEC) 10 MG tablet Take 1 tablet (10 mg total) by  mouth at bedtime. (Patient not taking: Reported on 09/25/2016) 30 tablet 11  . gabapentin (NEURONTIN) 300 MG capsule TAKE 1 CAPSULE DAILY AS NEEDED. 25 capsule 0  . Multiple Vitamins-Minerals (ADULT GUMMY PO) Take by mouth.    . triamcinolone cream (KENALOG) 0.1 % Apply 1  application topically 2 (two) times daily. 30 g 0  . Ascorbic Acid (VITAMIN C) 100 MG tablet Take 100 mg by mouth daily.     No facility-administered medications prior to visit.     PAST MEDICAL HISTORY: Past Medical History:  Diagnosis Date  . Allergy   . Arthritis   . Asthma   . Depression     PAST SURGICAL HISTORY: Past Surgical History:  Procedure Laterality Date  . BREAST SURGERY    . KNEE SURGERY      FAMILY HISTORY: Family History  Problem Relation Age of Onset  . Skin cancer Mother   . Asthma Son   . Seizures Maternal Grandmother   . Cancer Maternal Grandmother   . Neuropathy Maternal Grandfather   . Diabetic kidney disease Maternal Grandfather     SOCIAL HISTORY: Social History   Social History  . Marital status: Divorced    Spouse name: N/A  . Number of children: N/A  . Years of education: N/A   Occupational History  . Not on file.   Social History Main Topics  . Smoking status: Former Research scientist (life sciences)  . Smokeless tobacco: Never Used  . Alcohol use 0.6 oz/week    1 Glasses of wine per week     Comment: occcasionally  . Drug use: No  . Sexual activity: Not on file   Other Topics Concern  . Not on file   Social History Narrative  . No narrative on file     PHYSICAL EXAM  Vitals:   11/04/16 1439  BP: 112/70  Pulse: 72  Weight: 175 lb (79.4 kg)  Height: 5' 5" (1.651 m)   Body mass index is 29.12 kg/m.  Generalized: Well developed, in no acute distress  Head: normocephalic and atraumatic,. Oropharynx benign  Neck: Supple,  Musculoskeletal: No deformity   Neurological examination   Mentation: Alert oriented to time, place, history taking. Attention span and concentration appropriate. Recent and remote memory intact.  Follows all commands speech and language fluent.   Cranial nerve II-XII: Pupils were equal round reactive to light extraocular movements were full, visual field were full on confrontational test. Facial sensation and strength  were normal. hearing was intact to finger rubbing bilaterally. Uvula tongue midline. head turning and shoulder shrug were normal and symmetric.Tongue protrusion into cheek strength was normal. Motor: normal bulk and tone, full strength in the BUE, BLE, fine finger movements normal, no pronator drift. No focal weakness Sensory: intact to touch , pinprick , position  But diminished vibratory sensation. Over the lateral 3 toes bilaterally. Slight hyperesthesia over the dorsum of feet bilaterally  Coordination: finger-nose-finger, heel-to-shin bilaterally, no dysmetria Reflexes: 1+ upper lower and symmetric plantar responses were flexor bilaterally. Gait and Station: Rising up from seated position without assistance, normal stance,  moderate stride, good arm swing, smooth turning, able to perform tiptoe, and heel walking without difficulty. Tandem gait is steady  DIAGNOSTIC DATA (LABS, IMAGING, TESTING) - I reviewed patient records, labs, notes, testing and imaging myself where available.  Lab Results  Component Value Date   WBC 6.8 09/25/2016   HGB 15.3 09/25/2016   HCT 43.6 09/25/2016   MCV 91.9 09/25/2016   PLT 285 07/10/2014  Component Value Date/Time   NA 139 07/10/2014 1609   K 4.1 07/10/2014 1609   CL 100 07/10/2014 1609   CO2 30 07/10/2014 1609   GLUCOSE 94 07/10/2014 1609   BUN 9 07/10/2014 1609   CREATININE 0.68 07/10/2014 1609   CALCIUM 10.1 07/10/2014 1609   PROT 6.5 02/26/2016 1645   ALBUMIN 4.6 07/10/2014 1609   AST 22 07/10/2014 1609   ALT 17 07/10/2014 1609   ALKPHOS 45 07/10/2014 1609   BILITOT 0.3 07/10/2014 1609   GFRNONAA >89 07/10/2014 1609   GFRAA >89 07/10/2014 1609    Lab Results  Component Value Date   VITAMINB12 602 02/26/2016   Lab Results  Component Value Date   TSH 1.670 02/26/2016      ASSESSMENT AND PLAN 40 year Caucasian lady with 3.5 year history of  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  EMG nerve  conduction was normal without evidence of neuropathy or radiculopathy. Lab work to check for treatable causes of neuropathy all returned normal   Continue Gabapentin at current dose at night. Patient does not have symptoms during the daytime. Follow up in 1 year I spent 15 min  in total face to face time with the patient more than 50% of which was spent counseling and coordination of care, reviewing test results reviewing medications and discussing and reviewing the diagnosis of paresthesias and neuropathy and further treatment options. If necessary , Nancy Carolyn Martin, GNP, BC, APRN  [facility] [address], [address] [place], [place] [address] [phone] 

## 2016-11-04 NOTE — Progress Notes (Signed)
I agree with the above plan 

## 2016-12-25 ENCOUNTER — Other Ambulatory Visit: Payer: Self-pay | Admitting: Physician Assistant

## 2016-12-25 DIAGNOSIS — R202 Paresthesia of skin: Secondary | ICD-10-CM

## 2016-12-25 DIAGNOSIS — R200 Anesthesia of skin: Secondary | ICD-10-CM

## 2017-01-29 DIAGNOSIS — Z85828 Personal history of other malignant neoplasm of skin: Secondary | ICD-10-CM | POA: Diagnosis not present

## 2017-01-29 DIAGNOSIS — L573 Poikiloderma of Civatte: Secondary | ICD-10-CM | POA: Diagnosis not present

## 2017-06-02 DIAGNOSIS — L309 Dermatitis, unspecified: Secondary | ICD-10-CM | POA: Diagnosis not present

## 2017-10-02 ENCOUNTER — Other Ambulatory Visit: Payer: Self-pay

## 2017-10-02 ENCOUNTER — Ambulatory Visit: Payer: 59 | Admitting: Physician Assistant

## 2017-10-02 ENCOUNTER — Encounter: Payer: Self-pay | Admitting: Physician Assistant

## 2017-10-02 ENCOUNTER — Other Ambulatory Visit: Payer: Self-pay | Admitting: Family Medicine

## 2017-10-02 ENCOUNTER — Ambulatory Visit (INDEPENDENT_AMBULATORY_CARE_PROVIDER_SITE_OTHER): Payer: 59

## 2017-10-02 VITALS — BP 134/76 | HR 106 | Temp 98.3°F | Resp 16 | Ht 64.75 in | Wt 167.8 lb

## 2017-10-02 DIAGNOSIS — R0789 Other chest pain: Secondary | ICD-10-CM

## 2017-10-02 DIAGNOSIS — R079 Chest pain, unspecified: Secondary | ICD-10-CM | POA: Diagnosis not present

## 2017-10-02 DIAGNOSIS — F411 Generalized anxiety disorder: Secondary | ICD-10-CM

## 2017-10-02 DIAGNOSIS — R0781 Pleurodynia: Secondary | ICD-10-CM | POA: Diagnosis not present

## 2017-10-02 MED ORDER — IBUPROFEN 600 MG PO TABS: 600.0000 mg | ORAL_TABLET | Freq: Three times a day (TID) | ORAL | 0 refills | Status: DC | PRN

## 2017-10-02 MED ORDER — ESCITALOPRAM OXALATE 10 MG PO TABS: 10.0000 mg | ORAL_TABLET | Freq: Every day | ORAL | 3 refills | Status: DC

## 2017-10-02 NOTE — Patient Instructions (Addendum)
Your chest x-ray looks prefect. I am treating you today for chest wall pain.  Rest as much as you can. Wear sports bras only for the next 2-3 weeks. Apply heat and/or ice to the area (whichever feels best).   Start taking Ibuprofen 656m as directed. Do not use with any other otc pain medication other than tylenol/acetaminophen - so no aleve, motrin, advil, etc.  Start taking Lexapro 110mdaily for axiety. It may take a few weeks before you feel a positive effect of this medication. Come back and see me in 4-6 weeks to check in - we may need to increase your dose.   Chest Wall Pain Chest wall pain is pain in or around the bones and muscles of your chest. Sometimes, an injury causes this pain. Sometimes, the cause may not be known. This pain may take several weeks or longer to get better. Follow these instructions at home: Pay attention to any changes in your symptoms. Take these actions to help with your pain:  Rest as told by your health care provider.  Avoid activities that cause pain. These include any activities that use your chest muscles or your abdominal and side muscles to lift heavy items.  If directed, apply ice to the painful area: ? Put ice in a plastic bag. ? Place a towel between your skin and the bag. ? Leave the ice on for 20 minutes, 2-3 times per day.  Take over-the-counter and prescription medicines only as told by your health care provider.  Do not use tobacco products, including cigarettes, chewing tobacco, and e-cigarettes. If you need help quitting, ask your health care provider.  Keep all follow-up visits as told by your health care provider. This is important.  Contact a health care provider if:  You have a fever.  Your chest pain becomes worse.  You have new symptoms. Get help right away if:  You have nausea or vomiting.  You feel sweaty or light-headed.  You have a cough with phlegm (sputum) or you cough up blood.  You develop shortness of  breath. This information is not intended to replace advice given to you by your health care provider. Make sure you discuss any questions you have with your health care provider. Document Released: 06/16/2005 Document Revised: 10/25/2015 Document Reviewed: 09/11/2014 Elsevier Interactive Patient Education  2018 [employer] Escitalopram tablets What is this medicine? ESCITALOPRAM (es sye TAL oh pram) is used to treat depression and certain types of anxiety. This medicine may be used for other purposes; ask your health care provider or pharmacist if you have questions. COMMON BRAND NAME(S): Lexapro What should I tell my health care provider before I take this medicine? They need to know if you have any of these conditions: -bipolar disorder or a family history of bipolar disorder -diabetes -glaucoma -heart disease -kidney or liver disease -receiving electroconvulsive therapy -seizures (convulsions) -suicidal thoughts, plans, or attempt by you or a family member -an unusual or allergic reaction to escitalopram, the related drug citalopram, other medicines, foods, dyes, or preservatives -pregnant or trying to become pregnant -breast-feeding How should I use this medicine? Take this medicine by mouth with a glass of water. Follow the directions on the prescription label. You can take it with or without food. If it upsets your stomach, take it with food. Take your medicine at regular intervals. Do not take it more often than directed. Do not stop taking this medicine suddenly except upon the advice of your doctor. Stopping this  medicine too quickly may cause serious side effects or your condition may worsen. A special MedGuide will be given to you by the pharmacist with each prescription and refill. Be sure to read this information carefully each time. Talk to your pediatrician regarding the use of this medicine in children. Special care may be needed. Overdosage: If you think you have taken  too much of this medicine contact a poison control center or emergency room at once. NOTE: This medicine is only for you. Do not share this medicine with others. What if I miss a dose? If you miss a dose, take it as soon as you can. If it is almost time for your next dose, take only that dose. Do not take double or extra doses. What may interact with this medicine? Do not take this medicine with any of the following medications: -certain medicines for fungal infections like fluconazole, itraconazole, ketoconazole, posaconazole, voriconazole -cisapride -citalopram -dofetilide -dronedarone -linezolid -MAOIs like Carbex, Eldepryl, Marplan, Nardil, and Parnate -methylene blue (injected into a vein) -pimozide -thioridazine -ziprasidone This medicine may also interact with the following medications: -alcohol -amphetamines -aspirin and aspirin-like medicines -carbamazepine -certain medicines for depression, anxiety, or psychotic disturbances -certain medicines for migraine headache like almotriptan, eletriptan, frovatriptan, naratriptan, rizatriptan, sumatriptan, zolmitriptan -certain medicines for sleep -certain medicines that treat or prevent blood clots like warfarin, enoxaparin, dalteparin -cimetidine -diuretics -fentanyl -furazolidone -isoniazid -lithium -metoprolol -NSAIDs, medicines for pain and inflammation, like ibuprofen or naproxen -other medicines that prolong the QT interval (cause an abnormal heart rhythm) -procarbazine -rasagiline -supplements like St. John's wort, kava kava, valerian -tramadol -tryptophan This list may not describe all possible interactions. Give your health care provider a list of all the medicines, herbs, non-prescription drugs, or dietary supplements you use. Also tell them if you smoke, drink alcohol, or use illegal drugs. Some items may interact with your medicine. What should I watch for while using this medicine? Tell your doctor if your  symptoms do not get better or if they get worse. Visit your doctor or health care professional for regular checks on your progress. Because it may take several weeks to see the full effects of this medicine, it is important to continue your treatment as prescribed by your doctor. Patients and their families should watch out for new or worsening thoughts of suicide or depression. Also watch out for sudden changes in feelings such as feeling anxious, agitated, panicky, irritable, hostile, aggressive, impulsive, severely restless, overly excited and hyperactive, or not being able to sleep. If this happens, especially at the beginning of treatment or after a change in dose, call your health care professional. Ann Sampson may get drowsy or dizzy. Do not drive, use machinery, or do anything that needs mental alertness until you know how this medicine affects you. Do not stand or sit up quickly, especially if you are an older patient. This reduces the risk of dizzy or fainting spells. Alcohol may interfere with the effect of this medicine. Avoid alcoholic drinks. Your mouth may get dry. Chewing sugarless gum or sucking hard candy, and drinking plenty of water may help. Contact your doctor if the problem does not go away or is severe. What side effects may I notice from receiving this medicine? Side effects that you should report to your doctor or health care professional as soon as possible: -allergic reactions like skin rash, itching or hives, swelling of the face, lips, or tongue -anxious -black, tarry stools -changes in vision -confusion -elevated mood, decreased need for sleep, racing  thoughts, impulsive behavior -eye pain -fast, irregular heartbeat -feeling faint or lightheaded, falls -feeling agitated, angry, or irritable -hallucination, loss of contact with reality -loss of balance or coordination -loss of memory -painful or prolonged erections -restlessness, pacing, inability to keep  still -seizures -stiff muscles -suicidal thoughts or other mood changes -trouble sleeping -unusual bleeding or bruising -unusually weak or tired -vomiting Side effects that usually do not require medical attention (report to your doctor or health care professional if they continue or are bothersome): -changes in appetite -change in sex drive or performance -headache -increased sweating -indigestion, nausea -tremors This list may not describe all possible side effects. Call your doctor for medical advice about side effects. You may report side effects to FDA at 1-800-FDA-1088. Where should I keep my medicine? Keep out of reach of children. Store at room temperature between 15 and 30 degrees C (59 and 86 degrees F). Throw away any unused medicine after the expiration date. NOTE: This sheet is a summary. It may not cover all possible information. If you have questions about this medicine, talk to your doctor, pharmacist, or health care provider.  2018 Elsevier/Gold Standard (2015-11-19 13:20:23)  Thank you for coming in today. I hope you feel we met your needs.  Feel free to call PCP if you have any questions or further requests.  Please consider signing up for MyChart if you do not already have it, as this is a great way to communicate with me.  Best,  Ann McVey, PA-C  IF you received an x-ray today, you will receive an invoice from [facility]. Please contact [facility] at [phone] with questions or concerns regarding your invoice.   IF you received labwork today, you will receive an invoice from [facility]. Please contact [facility] at [phone] with questions or concerns regarding your invoice.   Our billing staff will not be able to assist you with questions regarding bills from these companies.  You will be contacted with the lab results as soon as they are available. The fastest way to get your results is to activate your My Chart account.  Instructions are located on the last page of this paperwork. If you have not heard from [place] regarding the results in 2 weeks, please contact this office.

## 2017-10-02 NOTE — Telephone Encounter (Signed)
Last OV for this issue: 2017 PCP: Neva SeatGreene Pharmacy: [facility] [place] - [address] [phone] (Phone) [phone] (Fax)

## 2017-10-02 NOTE — Progress Notes (Signed)
Ann Sampson  MRN: [id number] DOB: 04/04/1976  PCP: Shade Flood, MD  Subjective:  Pt is a 42 year old right-handed female who presents to clinic for chest pain x 1 week.   Pain is underneath her left breast. Hurts worse when pressure is applied- she is having to wear a sports bra rather than underwire. Pain is intermittent.  This is not a new problem - she has been dealing with this x several months. Happens once a week. Pain sometimes wakes her up at night. She describes her pain as "deep and pressure". Pain with deep inspiration.   No MOI. She did, however, fall last week while walking down the stairs. She has h/o neuropathy and is also "clumsy" (per pt). She feels safe at home.  She has not been taking anything to feel better.  Denies association with food, rash, fever, chills, chest pain, neck pain, left arm pain, palpitations.   Suffers from anxiety. She has been treated several years ago with Xanax PRN. She states her work is a major source of anxiety in her life. Sometimes she wakes at night and cannot get back to sleep.   Review of Systems  Constitutional: Negative for diaphoresis and fatigue.  Cardiovascular: Positive for chest pain. Negative for palpitations.  Gastrointestinal: Negative for abdominal pain, diarrhea, nausea and vomiting.  Musculoskeletal: Negative for arthralgias.    Patient Active Problem List   Diagnosis Date Noted  . Neuropathy 11/04/2016  . Paresthesias 11/04/2016    Current Outpatient Medications on File Prior to Visit  Medication Sig Dispense Refill  . Calcium Carbonate-Vit D-Min (CALCIUM 1200 PO) Take by mouth.    . gabapentin (NEURONTIN) 300 MG capsule TAKE 1 CAPSULE DAILY 30 capsule 11  . Multiple Vitamins-Minerals (ADULT GUMMY PO) Take by mouth.    . triamcinolone cream (KENALOG) 0.1 % Apply 1 application topically 2 (two) times daily. 30 g 0  . cetirizine (ZYRTEC) 10 MG tablet Take 1 tablet (10 mg total) by mouth at bedtime. (Patient  not taking: Reported on 09/25/2016) 30 tablet 11   No current facility-administered medications on file prior to visit.     Allergies  Allergen Reactions  . Bextra [Valdecoxib]   . Flagyl [Metronidazole]   . Prednisone     Anaphylaxis   . Vioxx [Rofecoxib]      Objective:  BP (!) 160/93   Pulse (!) 104   Temp 98.3 F (36.8 C) (Oral)   Resp 16   Ht 5' 4.75" (1.645 m)   Wt 167 lb 12.8 oz (76.1 kg)   LMP 09/06/2017   SpO2 100%   BMI 28.14 kg/m   Physical Exam  Constitutional: She is oriented to person, place, and time and well-developed, well-nourished, and in no distress. No distress.  Cardiovascular: Normal rate, regular rhythm and normal heart sounds.  Pulmonary/Chest: She exhibits tenderness and bony tenderness. She exhibits no mass, no crepitus and no deformity.    TTP inferior to left breast. No bruising, deformity, rash  Neurological: She is alert and oriented to person, place, and time. GCS score is 15.  Skin: Skin is warm and dry. No rash noted.  Psychiatric: Memory, affect and judgment normal. Her mood appears anxious.  tearful  Vitals reviewed.   Dg Chest 2 View  Result Date: 10/02/2017 CLINICAL DATA:  Chest pain EXAM: CHEST - 2 VIEW COMPARISON:  None. FINDINGS: The lungs are clear. The heart size and pulmonary vascularity are normal. No adenopathy. No pneumothorax. No bone lesions.  IMPRESSION: No edema or consolidation. Electronically Signed   By: Bretta BangWilliam  Woodruff III M.D.   On: 10/02/2017 11:20   Dg Ribs Unilateral W/chest Left  Result Date: 10/02/2017 CLINICAL DATA:  Left-sided pain EXAM: LEFT RIBS   - 3+ VIEW COMPARISON:  Chest radiograph October 02, 2017 FINDINGS: Frontal as well as bilateral oblique left rib images obtained. No evident rib fracture. Lungs clear. Heart size and pulmonary vascularity are normal. No effusion or pneumothorax. IMPRESSION: Left lung clear.  No demonstrable rib fracture. Electronically Signed   By: Bretta BangWilliam  Woodruff III M.D.   On:  10/02/2017 11:21    Assessment and Plan :  1. Chest wall pain - DG Chest 2 View; Future - DG Ribs Unilateral W/Chest Left; Future - ibuprofen (ADVIL,MOTRIN) 600 MG tablet; Take 1 tablet (600 mg total) by mouth every 8 (eight) hours as needed.  Dispense: 90 tablet; Refill: 0 - Pt presents with chest pain x several month. PE suggests MSK pain. X-ray is negative. Plan to treat with heat/ice, NSAID, rest. Pain seems to be out of proportion to exam. Unsure whether her anxiety surrounding the situation is contributing to the problem - pt was tearful multiple times throughout the appointment. Plan to treat for anxiety with lexapro. RTC in 4 weeks for recheck. Consider MRI if no improvement.  2. Generalized anxiety disorder - escitalopram (LEXAPRO) 10 MG tablet; Take 1 tablet (10 mg total) by mouth daily.  Dispense: 30 tablet; Refill: 3  Whitney Dalton Mille, PA-C  [facility] at [facility] 10/02/2017 10:41 AM

## 2017-10-06 ENCOUNTER — Telehealth: Payer: Self-pay | Admitting: Family Medicine

## 2017-10-06 NOTE — Telephone Encounter (Signed)
Copied from CRM [phone]. Topic: Quick Communication - See Telephone Encounter >> Oct 06, 2017  9:41 AM Cipriano BunkerLambe, Annette S wrote: CRM for notification.   Pt. Is wanting to get tested again for auto immune system and have blood work done. If so will need order put in  and call set up an appt.   See Telephone encounter for: 10/06/17.

## 2017-10-06 NOTE — Telephone Encounter (Signed)
Phone call to patient to clarify request. Patient states she has been having "issues for several years". She states she would like to be tested again for autoimmune disease. Patient advised she may need office visit for this, she is agreeable with coming in to be seen. Patient transferred up to the front desk to schedule.

## 2017-10-07 ENCOUNTER — Ambulatory Visit: Payer: 59 | Admitting: Physician Assistant

## 2017-10-07 ENCOUNTER — Encounter: Payer: Self-pay | Admitting: Physician Assistant

## 2017-10-07 VITALS — BP 144/79 | HR 68 | Temp 97.6°F | Resp 17 | Ht 65.0 in | Wt 162.0 lb

## 2017-10-07 DIAGNOSIS — M2550 Pain in unspecified joint: Secondary | ICD-10-CM

## 2017-10-07 NOTE — Progress Notes (Signed)
Ann Sampson  MRN: [id number] DOB: [birth year]  PCP: Wendie Agreste, MD  Subjective:  Pt is a 42 year old female PMH anxiety who presents to clinic for joint pains.   H/o pins and needles in feet and hands x several years. She has seen rheumatologist and neurologist. Lab work done on 02/26/16 for HIV, Chardon syndrome, vitamin B12, TSH, vitamin D, M protein, ANA, rheumatoid factor, he is levels and ESR were all normal  C/o joint pains - this is a new problem for her.  Left shoulder "has been bothering me" for about 6 months. Feels "sore" sometimes. Not every day.  Hands feel sore. Both. Sometimes it's the entire hand, other times it's just her fingers that are sore. This is new within the past 1 month. Has not noticed if it's worse in the morning vs night. Nothing makes it better or worse. Happens randomly.  Hip pain x 6 months - randomly starts hurting.  Cannot recall which hip. Feels sore, "like I just went on a 10 mile hike". Nothing makes it better or worse.   FHx: MGM had a rheumatologic condition - she cannot recall.  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   Review of Systems  Constitutional: Negative for activity change, appetite change, diaphoresis, fatigue and unexpected weight change.  Respiratory: Negative for cough, shortness of breath and wheezing.   Cardiovascular: Negative for chest pain, palpitations and leg swelling.  Endocrine: Negative for cold intolerance, heat intolerance, polydipsia, polyphagia and polyuria.  Musculoskeletal: Positive for arthralgias. Negative for back pain, gait problem, joint swelling and myalgias.  Skin: Negative.   Neurological: Negative for dizziness, weakness, light-headedness and headaches.    Patient Active Problem List   Diagnosis Date Noted  . Neuropathy 11/04/2016  . Paresthesias 11/04/2016    Current Outpatient Medications on File Prior to Visit  Medication Sig  Dispense Refill  . Calcium Carbonate-Vit D-Min (CALCIUM 1200 PO) Take by mouth.    . EPINEPHRINE 0.3 mg/0.3 mL IJ SOAJ injection USE AS DIRECTED 1 Device 0  . escitalopram (LEXAPRO) 10 MG tablet Take 1 tablet (10 mg total) by mouth daily. 30 tablet 3  . gabapentin (NEURONTIN) 300 MG capsule TAKE 1 CAPSULE DAILY 30 capsule 11  . ibuprofen (ADVIL,MOTRIN) 600 MG tablet Take 1 tablet (600 mg total) by mouth every 8 (eight) hours as needed. 90 tablet 0  . Multiple Vitamins-Minerals (ADULT GUMMY PO) Take by mouth.    . triamcinolone cream (KENALOG) 0.1 % Apply 1 application topically 2 (two) times daily. 30 g 0  . cetirizine (ZYRTEC) 10 MG tablet Take 1 tablet (10 mg total) by mouth at bedtime. (Patient not taking: Reported on 09/25/2016) 30 tablet 11   No current facility-administered medications on file prior to visit.     Allergies  Allergen Reactions  . Bextra [Valdecoxib]   . Flagyl [Metronidazole]   . Prednisone     Anaphylaxis   . Vioxx [Rofecoxib]      Objective:  BP (!) 144/79   Pulse 68   Temp 97.6 F (36.4 C) (Oral)   Resp 17   Ht '5\' 5"'  (1.651 m)   Wt 162 lb (73.5 kg)   LMP 10/04/2017 (Approximate)   SpO2 98%   BMI 26.96 kg/m   Physical Exam  Constitutional: She is oriented to person, place, and time. No distress.  Cardiovascular: Normal rate, regular rhythm and normal heart sounds.  Musculoskeletal:  Right shoulder: She exhibits normal range of motion, no tenderness, no bony tenderness, no swelling and normal strength.       Left shoulder: She exhibits normal range of motion, no tenderness, no bony tenderness and normal strength.       Right hip: She exhibits normal range of motion, normal strength, no tenderness and no bony tenderness.       Left hip: She exhibits normal range of motion, normal strength, no tenderness and no bony tenderness.       Right hand: She exhibits normal range of motion and no tenderness. Normal sensation noted. Normal strength noted.        Left hand: She exhibits normal range of motion and no tenderness. Normal sensation noted. Normal strength noted.  Neurological: She is alert and oriented to person, place, and time.  Skin: Skin is warm and dry.  Psychiatric: Judgment normal.  Vitals reviewed.   Assessment and Plan :  1. Arthralgia, unspecified joint - CMP14+EGFR - Rheumatoid Factor Screen - CBC with Differential/Platelet - TSH - CK - C-reactive protein - Vitamin P37 - CYCLIC CITRUL PEPTIDE ANTIBODY, IGG/IGA - Pt presents for joint pains x 6 months. She has had negative work-up from rheumatology and neurology in the past. She would like new lab work today. Suspect possible polymyalgia rheumatica. She has h/o prednisone allergy. Labs are pending. Will contact with results and plan. Consider referral to rheum.  Mercer Pod, PA-C  Primary Care at [facility] 10/07/2017 1:50 PM

## 2017-10-07 NOTE — Patient Instructions (Addendum)
  We will contact you with the results of today's blood work. We will form a plan from there.    Thank you for coming in today. I hope you feel we met your needs.  Feel free to call PCP if you have any questions or further requests.  Please consider signing up for MyChart if you do not already have it, as this is a great way to communicate with me.  Best,  Whitney McVey, PA-C    IF you received an x-ray today, you will receive an invoice from [facility]. Please contact [facility] at [phone] with questions or concerns regarding your invoice.   IF you received labwork today, you will receive an invoice from [facility]. Please contact [facility] at [phone] with questions or concerns regarding your invoice.   Our billing staff will not be able to assist you with questions regarding bills from these companies.  You will be contacted with the lab results as soon as they are available. The fastest way to get your results is to activate your My Chart account. Instructions are located on the last page of this paperwork. If you have not heard from [place] regarding the results in 2 weeks, please contact this office.

## 2017-10-08 LAB — CBC WITH DIFFERENTIAL/PLATELET
Basophils Absolute: 0 10*3/uL (ref 0.0–0.2)
Basos: 0 %
EOS (ABSOLUTE): 0.1 10*3/uL (ref 0.0–0.4)
Eos: 1 %
Hematocrit: 42.3 % (ref 34.0–46.6)
Hemoglobin: 14.4 g/dL (ref 11.1–15.9)
Immature Grans (Abs): 0 10*3/uL (ref 0.0–0.1)
Immature Granulocytes: 0 %
Lymphocytes Absolute: 2.1 10*3/uL (ref 0.7–3.1)
Lymphs: 41 %
MCH: 31.2 pg (ref 26.6–33.0)
MCHC: 34 g/dL (ref 31.5–35.7)
MCV: 92 fL (ref 79–97)
Monocytes Absolute: 0.2 10*3/uL (ref 0.1–0.9)
Monocytes: 5 %
Neutrophils Absolute: 2.8 10*3/uL (ref 1.4–7.0)
Neutrophils: 53 %
Platelets: 267 10*3/uL (ref 150–379)
RBC: 4.61 x10E6/uL (ref 3.77–5.28)
RDW: 12.7 % (ref 12.3–15.4)
WBC: 5.2 10*3/uL (ref 3.4–10.8)

## 2017-10-08 LAB — URIC A+ESR-WES+ANA+RA QN+CR
ASO: 97 IU/mL (ref 0.0–200.0)
Anti Nuclear Antibody(ANA): NEGATIVE
CRP: 5.5 mg/L — ABNORMAL HIGH (ref 0.0–4.9)
Rheumatoid fact SerPl-aCnc: <10 [IU]/mL (ref 0.0–13.9)
Sed Rate: 5 mm/hr (ref 0–32)
Uric Acid: 4 mg/dL (ref 2.5–7.1)

## 2017-10-08 LAB — CMP14+EGFR
ALT: 9 IU/L (ref 0–32)
AST: 18 IU/L (ref 0–40)
Albumin/Globulin Ratio: 1.9 (ref 1.2–2.2)
Albumin: 4.4 g/dL (ref 3.5–5.5)
Alkaline Phosphatase: 50 IU/L (ref 39–117)
BUN/Creatinine Ratio: 4 — ABNORMAL LOW (ref 9–23)
BUN: 3 mg/dL — ABNORMAL LOW (ref 6–24)
Bilirubin Total: 0.3 mg/dL (ref 0.0–1.2)
CO2: 25 mmol/L (ref 20–29)
Calcium: 9.4 mg/dL (ref 8.7–10.2)
Chloride: 98 mmol/L (ref 96–106)
Creatinine, Ser: 0.67 mg/dL (ref 0.57–1.00)
GFR calc Af Amer: 125 mL/min/{1.73_m2} (ref >59)
GFR calc non Af Amer: 109 mL/min/{1.73_m2} (ref >59)
Globulin, Total: 2.3 g/dL (ref 1.5–4.5)
Glucose: 129 mg/dL — ABNORMAL HIGH (ref 65–99)
Potassium: 4.2 mmol/L (ref 3.5–5.2)
Sodium: 139 mmol/L (ref 134–144)
Total Protein: 6.7 g/dL (ref 6.0–8.5)

## 2017-10-08 LAB — CK: Total CK: 48 U/L (ref 24–173)

## 2017-10-08 LAB — TSH: TSH: 1.43 u[IU]/mL (ref 0.450–4.500)

## 2017-10-08 LAB — CYCLIC CITRUL PEPTIDE ANTIBODY, IGG/IGA: Cyclic Citrullin Peptide Ab: 4 U (ref 0–19)

## 2017-10-08 LAB — VITAMIN B12: Vitamin B-12: 616 pg/mL (ref 232–1245)

## 2017-10-09 ENCOUNTER — Other Ambulatory Visit: Payer: Self-pay | Admitting: Physician Assistant

## 2017-10-09 DIAGNOSIS — M25519 Pain in unspecified shoulder: Secondary | ICD-10-CM

## 2017-10-09 NOTE — Progress Notes (Signed)
Concern for possible polymyalgia rheumatica. She has h/o severe allergic reaction to prednisone. Plan to refer to rheumatology.

## 2017-11-03 ENCOUNTER — Ambulatory Visit: Payer: 59 | Admitting: Physician Assistant

## 2017-11-03 ENCOUNTER — Other Ambulatory Visit: Payer: Self-pay

## 2017-11-03 ENCOUNTER — Encounter: Payer: Self-pay | Admitting: Physician Assistant

## 2017-11-03 VITALS — BP 124/80 | HR 65 | Temp 98.4°F | Resp 16 | Ht 65.0 in | Wt 168.6 lb

## 2017-11-03 DIAGNOSIS — F411 Generalized anxiety disorder: Secondary | ICD-10-CM | POA: Diagnosis not present

## 2017-11-03 DIAGNOSIS — Z79899 Other long term (current) drug therapy: Secondary | ICD-10-CM

## 2017-11-03 MED ORDER — ESCITALOPRAM OXALATE 10 MG PO TABS: 10.0000 mg | ORAL_TABLET | Freq: Every day | ORAL | 5 refills | Status: DC

## 2017-11-03 NOTE — Progress Notes (Signed)
[facility]  PATIENT: Ann Sampson DOB: 02/24/1976   REASON FOR VISIT: Follow-up for paresthesias HISTORY FROM: Patient    HISTORY OF PRESENT ILLNESS:Initial Consult 8/29/2017PS ;[name] is a 59 year pleasant Caucasian lady whose had pins and needles sensations in her feet for the last 3 years. Gradually this seems to have progressed to involve her fingertips and hands as well. She describes this as being intermittent most bothersome at night. She denies any lack of sensation. There is no significant weakness. She mostly has no trouble walking. On the during the winter she has some trouble climbing down steps. She has been taking gabapentin 300 mg at night which seems to work quite well. She could not tolerate 600 mg as it made her quite loopy and sleepy during the day. She seems quite happy and content with the present relief she has a gabapentin and is not interested in taking a daytime dose. She denies any symptoms of orthostasis, dizziness, syncope. She has no problems controlling her bladder or have any abnormal sweating. She was seen by rheumatologist whom she is unable to name. She underwent lab work which was essentially all unremarkable. 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 She denies any significant neck pain, injury, fall, trouble with balance or walking. She has no known history of diabetes. She does not drink alcohol. She has no history of exposure to toxins. She has not been on any chemotherapy. Update 05/07/2016 PS: She returns for follow-up after last visit 2 months ago. She continues to have tingling and numbness as well as pins and needle sensations in her feet and fingertips which are unchanged. She does take gabapentin 300 mg at night which seems to work quite well. She does not have enough bothersome symptoms in the daytime to try the medication. She did undergo no conduction EMG study on 02/2716 which was normal  without evidence of neuropathy or radiculopathy. Lab work done on 02/26/16 for HIV, Chardon syndrome, vitamin B12, TSH, vitamin D, M protein, ANA, rheumatoid factor, he is levels and ESR were all normal. Patient denies any symptoms of orthostasis, abnormal sweating, bladder or bowel control issues. She denies any difficulty with walking or balance or falling down. She states her weight has been steady and she has not lost her appetite or weight. She has no new complaints today. UPDATE 05/08/2018CM [name], 42 year old female returns for follow-up with history of paresthesias in the feet and fingertips. She is taking gabapentin 300 at night which is working well for her symptoms.her EMG nerve conduction was normal without evidence of neuropathy or radiculopathy. Lab work to check for treatable causes of neuropathy all returned normal. She has had no problems with ambulation or falls. She continues to be very active. She returns for reevaluation UPDATE 5/8/2019CM [name], 42 year old female returns for follow-up with a history of paresthesias in the  feet and fingertips.  She remains on gabapentin 300 at night which is working well for her  symptoms really do not bother her during the day.  She works as a Education officer, museum.  Labs in the past to look for treatable causes of neuropathy all returned normal EMG nerve conduction was normal.  She continues to be very active she returns for reevaluation.  She needs refills on her medication REVIEW OF SYSTEMS: Full 14 system review of systems performed and notable only for those listed, all others are neg:  Constitutional: neg  Cardiovascular:  neg Ear/Nose/Throat: neg  Skin: neg Eyes: neg Respiratory: neg Gastroitestinal: neg  Hematology/Lymphatic: neg  Endocrine: neg Musculoskeletal:neg Allergy/Immunology: neg Neurological: neg Psychiatric: neg Sleep : neg   ALLERGIES: Allergies  Allergen Reactions  . Bextra [Valdecoxib]   . Flagyl [Metronidazole]     . Prednisone     Anaphylaxis   . Vioxx [Rofecoxib]     HOME MEDICATIONS: Outpatient Medications Prior to Visit  Medication Sig Dispense Refill  . Calcium Carbonate-Vit D-Min (CALCIUM 1200 PO) Take by mouth.    . EPINEPHRINE 0.3 mg/0.3 mL IJ SOAJ injection USE AS DIRECTED 1 Device 0  . escitalopram (LEXAPRO) 10 MG tablet Take 1 tablet (10 mg total) by mouth daily. 30 tablet 5  . gabapentin (NEURONTIN) 300 MG capsule TAKE 1 CAPSULE DAILY 30 capsule 11  . ibuprofen (ADVIL,MOTRIN) 600 MG tablet Take 1 tablet (600 mg total) by mouth every 8 (eight) hours as needed. 90 tablet 0  . Multiple Vitamins-Minerals (ADULT GUMMY PO) Take by mouth.     No facility-administered medications prior to visit.     PAST MEDICAL HISTORY: Past Medical History:  Diagnosis Date  . Allergy   . Arthritis   . Asthma   . Depression     PAST SURGICAL HISTORY: Past Surgical History:  Procedure Laterality Date  . BREAST SURGERY    . KNEE SURGERY      FAMILY HISTORY: Family History  Problem Relation Age of Onset  . Skin cancer Mother   . Asthma Son   . Seizures Maternal Grandmother   . Cancer Maternal Grandmother   . Neuropathy Maternal Grandfather   . Diabetic kidney disease Maternal Grandfather     SOCIAL HISTORY: Social History   Socioeconomic History  . Marital status: Divorced    Spouse name: Not on file  . Number of children: Not on file  . Years of education: Not on file  . Highest education level: Not on file  Occupational History  . Not on file  Social Needs  . Financial resource strain: Not on file  . Food insecurity:    Worry: Not on file    Inability: Not on file  . Transportation needs:    Medical: Not on file    Non-medical: Not on file  Tobacco Use  . Smoking status: Former Research scientist (life sciences)  . Smokeless tobacco: Never Used  Substance and Sexual Activity  . Alcohol use: Yes    Alcohol/week: 0.6 oz    Types: 1 Glasses of wine per week    Comment: occcasionally  . Drug use:  No  . Sexual activity: Not on file  Lifestyle  . Physical activity:    Days per week: Not on file    Minutes per session: Not on file  . Stress: Not on file  Relationships  . Social connections:    Talks on phone: Not on file    Gets together: Not on file    Attends religious service: Not on file    Active member of club or organization: Not on file    Attends meetings of clubs or organizations: Not on file    Relationship status: Not on file  . Intimate partner violence:    Fear of current or ex partner: Not on file    Emotionally abused: Not on file    Physically abused: Not on file    Forced sexual activity: Not on file  Other Topics Concern  . Not on file  Social History Narrative  . Not on  file     PHYSICAL EXAM  Vitals:   11/04/17 1407  BP: 129/83  Pulse: 70  Weight: 170 lb (77.1 kg)  Height: _0  (1.651 m)   Body mass index is 28.29 kg/m.  Generalized: Well developed, in no acute distress  Head: normocephalic and atraumatic,. Oropharynx benign  Neck: Supple,  Musculoskeletal: No deformity   Neurological examination   Mentation: Alert oriented to time, place, history taking. Attention span and concentration appropriate. Recent and remote memory intact.  Follows all commands speech and language fluent.   Cranial nerve II-XII: Pupils were equal round reactive to light extraocular movements were full, visual field were full on confrontational test. Facial sensation and strength were normal. hearing was intact to finger rubbing bilaterally. Uvula tongue midline. head turning and shoulder shrug were normal and symmetric.Tongue protrusion into cheek strength was normal. Motor: normal bulk and tone, full strength in the BUE, BLE, fine finger movements normal, no pronator drift. No focal weakness Sensory: intact to touch , pinprick , position  But diminished vibratory sensation. Over the lateral 3 toes bilaterally. Slight hyperesthesia over the dorsum of feet  bilaterally  Coordination: finger-nose-finger, heel-to-shin bilaterally, no dysmetria Reflexes: 1+ upper lower and symmetric plantar responses were flexor bilaterally. Gait and Station: Rising up from seated position without assistance, normal stance,  moderate stride, good arm swing, smooth turning, able to perform tiptoe, and heel walking without difficulty. Tandem gait is steady  DIAGNOSTIC DATA (LABS, IMAGING, TESTING) - I reviewed patient records, labs, notes, testing and imaging myself where available.  Lab Results  Component Value Date   WBC 5.2 10/07/2017   HGB 14.4 10/07/2017   HCT 42.3 10/07/2017   MCV 92 10/07/2017   PLT 267 10/07/2017      Component Value Date/Time   NA 139 10/07/2017 1631   K 4.2 10/07/2017 1631   CL 98 10/07/2017 1631   CO2 25 10/07/2017 1631   GLUCOSE 129 (H) 10/07/2017 1631   GLUCOSE 94 07/10/2014 1609   BUN 3 (L) 10/07/2017 1631   CREATININE 0.67 10/07/2017 1631   CREATININE 0.68 07/10/2014 1609   CALCIUM 9.4 10/07/2017 1631   PROT 6.7 10/07/2017 1631   ALBUMIN 4.4 10/07/2017 1631   AST 18 10/07/2017 1631   ALT 9 10/07/2017 1631   ALKPHOS 50 10/07/2017 1631   BILITOT 0.3 10/07/2017 1631   GFRNONAA 109 10/07/2017 1631   GFRNONAA >89 07/10/2014 1609   GFRAA 125 10/07/2017 1631   GFRAA >89 07/10/2014 1609    Lab Results  Component Value Date   VITAMINB12 616 10/07/2017   Lab Results  Component Value Date   TSH 1.430 10/07/2017      ASSESSMENT AND PLAN 41 year Caucasian lady with 3.5 year history of  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  EMG nerve conduction was normal without evidence of neuropathy or radiculopathy. Lab work to check for treatable causes of neuropathy all returned normal   Continue Gabapentin 355m  at night. Will refill Follow up in 1 year NDennie Bible [facility] APRN  [facility] [address] [place]ussellGC[place]ape[place] [phone]

## 2017-11-03 NOTE — Patient Instructions (Addendum)
Continue taking Lexapro 35m daily. I'm so glad this is working well for you!  Come back and see me in 6 months for your annual exam.    [place] are some of my general health and wellness recommendations. Some of them may apply to you better than others. Please use common sense as you try these suggestions and feel free to ask me any questions.   ACTIVITY/FITNESS Mental, social, emotional and physical stimulation are very important for brain and body health. Try learning a new activity (arts, music, language, sports, games).  Keep moving your body to the best of your abilities. You can do this at home, inside or outside, the park, community center, gym or anywhere you like. Consider a physical therapist or personal trainer to get started. Consider the app Sworkit. Fitness trackers such as smart-watches, smart-phones or Fitbits can help as well.   NUTRITION Eat more plants: colorful vegetables, nuts, seeds and berries.  Eat less sugar, salt, preservatives and processed foods.  Avoid toxins such as cigarettes and alcohol.  Drink water when you are thirsty. Warm water with a slice of lemon is an excellent morning drink to start the day.  Consider these websites for more information The Nutrition Source (hhttps://www.henry-hernandez.biz/ Precision Nutrition (wWindowBlog.ch   RELAXATION Consider practicing mindfulness meditation or other relaxation techniques such as deep breathing, prayer, yoga, tai chi, massage. See website mindful.org or the apps Headspace or Calm to help get started.   SLEEP Try to get at least 7-8+ hours sleep per day. Regular exercise and reduced caffeine will help you sleep better. Practice good sleep hygeine techniques. See website sleep.org for more information.  Thank you for coming in today. I hope you feel we met your needs.  Feel free to call PCP if you have any  questions or further requests.  Please consider signing up for MyChart if you do not already have it, as this is a great way to communicate with me.   Best,  Ann McVey, PA-C  Escitalopram tablets What is this medicine? ESCITALOPRAM (es sye TAL oh pram) is used to treat depression and certain types of anxiety. This medicine may be used for other purposes; ask your health care provider or pharmacist if you have questions. COMMON BRAND NAME(S): Lexapro What should I tell my health care provider before I take this medicine? They need to know if you have any of these conditions: -bipolar disorder or a family history of bipolar disorder -diabetes -glaucoma -heart disease -kidney or liver disease -receiving electroconvulsive therapy -seizures (convulsions) -suicidal thoughts, plans, or attempt by you or a family member -an unusual or allergic reaction to escitalopram, the related drug citalopram, other medicines, foods, dyes, or preservatives -pregnant or trying to become pregnant -breast-feeding How should I use this medicine? Take this medicine by mouth with a glass of water. Follow the directions on the prescription label. You can take it with or without food. If it upsets your stomach, take it with food. Take your medicine at regular intervals. Do not take it more often than directed. Do not stop taking this medicine suddenly except upon the advice of your doctor. Stopping this medicine too quickly may cause serious side effects or your condition may worsen. A special MedGuide will be given to you by the pharmacist with each prescription and refill. Be sure to read this information carefully each time. Talk to your pediatrician regarding the use of this medicine in children. Special care may be  needed. Overdosage: If you think you have taken too much of this medicine contact a poison control center or emergency room at once. NOTE: This medicine is only for you. Do not share this  medicine with others. What if I miss a dose? If you miss a dose, take it as soon as you can. If it is almost time for your next dose, take only that dose. Do not take double or extra doses. What may interact with this medicine? Do not take this medicine with any of the following medications: -certain medicines for fungal infections like fluconazole, itraconazole, ketoconazole, posaconazole, voriconazole -cisapride -citalopram -dofetilide -dronedarone -linezolid -MAOIs like Carbex, Eldepryl, Marplan, Nardil, and Parnate -methylene blue (injected into a vein) -pimozide -thioridazine -ziprasidone This medicine may also interact with the following medications: -alcohol -amphetamines -aspirin and aspirin-like medicines -carbamazepine -certain medicines for depression, anxiety, or psychotic disturbances -certain medicines for migraine headache like almotriptan, eletriptan, frovatriptan, naratriptan, rizatriptan, sumatriptan, zolmitriptan -certain medicines for sleep -certain medicines that treat or prevent blood clots like warfarin, enoxaparin, dalteparin -cimetidine -diuretics -fentanyl -furazolidone -isoniazid -lithium -metoprolol -NSAIDs, medicines for pain and inflammation, like ibuprofen or naproxen -other medicines that prolong the QT interval (cause an abnormal heart rhythm) -procarbazine -rasagiline -supplements like St. John's wort, kava kava, valerian -tramadol -tryptophan This list may not describe all possible interactions. Give your health care provider a list of all the medicines, herbs, non-prescription drugs, or dietary supplements you use. Also tell them if you smoke, drink alcohol, or use illegal drugs. Some items may interact with your medicine. What should I watch for while using this medicine? Tell your doctor if your symptoms do not get better or if they get worse. Visit your doctor or health care professional for regular checks on your progress. Because it  may take several weeks to see the full effects of this medicine, it is important to continue your treatment as prescribed by your doctor. Patients and their families should watch out for new or worsening thoughts of suicide or depression. Also watch out for sudden changes in feelings such as feeling anxious, agitated, panicky, irritable, hostile, aggressive, impulsive, severely restless, overly excited and hyperactive, or not being able to sleep. If this happens, especially at the beginning of treatment or after a change in dose, call your health care professional. Dennis Bast may get drowsy or dizzy. Do not drive, use machinery, or do anything that needs mental alertness until you know how this medicine affects you. Do not stand or sit up quickly, especially if you are an older patient. This reduces the risk of dizzy or fainting spells. Alcohol may interfere with the effect of this medicine. Avoid alcoholic drinks. Your mouth may get dry. Chewing sugarless gum or sucking hard candy, and drinking plenty of water may help. Contact your doctor if the problem does not go away or is severe. What side effects may I notice from receiving this medicine? Side effects that you should report to your doctor or health care professional as soon as possible: -allergic reactions like skin rash, itching or hives, swelling of the face, lips, or tongue -anxious -black, tarry stools -changes in vision -confusion -elevated mood, decreased need for sleep, racing thoughts, impulsive behavior -eye pain -fast, irregular heartbeat -feeling faint or lightheaded, falls -feeling agitated, angry, or irritable -hallucination, loss of contact with reality -loss of balance or coordination -loss of memory -painful or prolonged erections -restlessness, pacing, inability to keep still -seizures -stiff muscles -suicidal thoughts or other mood changes -trouble sleeping -unusual  bleeding or bruising -unusually weak or  tired -vomiting Side effects that usually do not require medical attention (report to your doctor or health care professional if they continue or are bothersome): -changes in appetite -change in sex drive or performance -headache -increased sweating -indigestion, nausea -tremors This list may not describe all possible side effects. Call your doctor for medical advice about side effects. You may report side effects to FDA at 1-800-FDA-1088. Where should I keep my medicine? Keep out of reach of children. Store at room temperature between 15 and 30 degrees C (59 and 86 degrees F). Throw away any unused medicine after the expiration date. NOTE: This sheet is a summary. It may not cover all possible information. If you have questions about this medicine, talk to your doctor, pharmacist, or health care provider.  2018 Elsevier/Gold Standard (2015-11-19 13:20:23)  IF you received an x-ray today, you will receive an invoice from [facility]. Please contact [facility] at [phone] with questions or concerns regarding your invoice.   IF you received labwork today, you will receive an invoice from [place]. Please contact [facility] at [phone] with questions or concerns regarding your invoice.   Our billing staff will not be able to assist you with questions regarding bills from these companies.  You will be contacted with the lab results as soon as they are available. The fastest way to get your results is to activate your My Chart account. Instructions are located on the last page of this paperwork. If you have not heard from [place] regarding the results in 2 weeks, please contact this office.

## 2017-11-03 NOTE — Progress Notes (Signed)
Ann Sampson  MRN: [id number] DOB: 1975/11/14  PCP: Shade Flood, MD  Subjective:  Pt is a 42 year old female who presents to clinic for medication check of Lexapro . Started this medication one month ago. "I didn't realize how bad my anxiety was. I should have been on this a long time ago". She is taking  at lunchtime.  She is sleeping much better now, "and if I do wake up I can go right to sleep".  She had some nausea during week one after starting the medication, but this resolved.  Stress surrounding work has greatly improved. No longer having chest pains and anxiety with stress. She is a Child psychotherapist. She presented to a group at a meeting last week and had no problem - "I would have freaked out about this in the past".  Only her sister knows she is taking this medication.   Review of Systems  Constitutional: Negative for diaphoresis and fatigue.  Gastrointestinal: Positive for nausea. Negative for abdominal pain and vomiting.  Psychiatric/Behavioral: Negative for dysphoric mood, self-injury, sleep disturbance and suicidal ideas. The patient is nervous/anxious.     Patient Active Problem List   Diagnosis Date Noted  . Neuropathy 11/04/2016  . Paresthesias 11/04/2016    Current Outpatient Medications on File Prior to Visit  Medication Sig Dispense Refill  . Calcium Carbonate-Vit D-Min (CALCIUM 1200 PO) Take by mouth.    . EPINEPHRINE 0.3 mg/0.3 mL IJ SOAJ injection USE AS DIRECTED 1 Device 0  . escitalopram (LEXAPRO) 10 MG tablet Take 1 tablet (10 mg total) by mouth daily. 30 tablet 3  . gabapentin (NEURONTIN) 300 MG capsule TAKE 1 CAPSULE DAILY 30 capsule 11  . ibuprofen (ADVIL,MOTRIN) 600 MG tablet Take 1 tablet (600 mg total) by mouth every 8 (eight) hours as needed. 90 tablet 0  . Multiple Vitamins-Minerals (ADULT GUMMY PO) Take by mouth.    . cetirizine (ZYRTEC) 10 MG tablet Take 1 tablet (10 mg total) by mouth at bedtime. (Patient not taking: Reported on  09/25/2016) 30 tablet 11  . triamcinolone cream (KENALOG) 0.1 % Apply 1 application topically 2 (two) times daily. (Patient not taking: Reported on 11/03/2017) 30 g 0   No current facility-administered medications on file prior to visit.     Allergies  Allergen Reactions  . Bextra [Valdecoxib]   . Flagyl [Metronidazole]   . Prednisone     Anaphylaxis   . Vioxx [Rofecoxib]      Objective:  BP 124/80 (BP Location: Left Arm, Patient Position: Sitting, Cuff Size: Normal)   Pulse 65   Temp 98.4 F (36.9 C) (Oral)   Resp 16   Ht  (1.651 m)   Wt 168 lb 9.6 oz (76.5 kg)   LMP 11/03/2017   SpO2 99%   BMI 28.06 kg/m   Physical Exam  Constitutional: She is oriented to person, place, and time. No distress.  Cardiovascular: Normal rate, regular rhythm and normal heart sounds.  Neurological: She is alert and oriented to person, place, and time.  Skin: Skin is warm and dry.  Psychiatric: Judgment normal.  Anxiety greatly improved.   Vitals reviewed.   Assessment and Plan :  1. Generalized anxiety disorder 2. Encounter for medication management - escitalopram (LEXAPRO) 10 MG tablet; Take 1 tablet (10 mg total) by mouth daily.  Dispense: 30 tablet; Refill: 5 - Pt presents for f/u anxiety and medication check. She started Lexapro one month ago and endorses significant improvement in all  areas of her life. Con't Lexapro . RTC in 6 months for annual exam, including PAP.   Marco Collie, PA-C  Primary Care at [facility] 11/03/2017 8:17 AM

## 2017-11-04 ENCOUNTER — Ambulatory Visit: Payer: 59 | Admitting: Nurse Practitioner

## 2017-11-04 ENCOUNTER — Encounter: Payer: Self-pay | Admitting: Nurse Practitioner

## 2017-11-04 VITALS — BP 129/83 | HR 70 | Ht 65.0 in | Wt 170.0 lb

## 2017-11-04 DIAGNOSIS — R200 Anesthesia of skin: Secondary | ICD-10-CM

## 2017-11-04 DIAGNOSIS — R202 Paresthesia of skin: Secondary | ICD-10-CM | POA: Diagnosis not present

## 2017-11-04 MED ORDER — GABAPENTIN 300 MG PO CAPS: ORAL_CAPSULE | ORAL | 11 refills | Status: DC

## 2017-11-04 NOTE — Progress Notes (Signed)
I agree with the above plan 

## 2017-11-04 NOTE — Patient Instructions (Signed)
Continue Gabapentin   at night. Will refill Follow up in 1 year

## 2017-11-30 ENCOUNTER — Encounter: Payer: Self-pay | Admitting: Urgent Care

## 2017-11-30 ENCOUNTER — Ambulatory Visit: Payer: 59 | Admitting: Urgent Care

## 2017-11-30 ENCOUNTER — Other Ambulatory Visit: Payer: Self-pay

## 2017-11-30 VITALS — BP 116/82 | HR 96 | Temp 98.0°F | Resp 16 | Ht 64.37 in | Wt 169.6 lb

## 2017-11-30 DIAGNOSIS — W57XXXA Bitten or stung by nonvenomous insect and other nonvenomous arthropods, initial encounter: Secondary | ICD-10-CM | POA: Diagnosis not present

## 2017-11-30 DIAGNOSIS — K149 Disease of tongue, unspecified: Secondary | ICD-10-CM

## 2017-11-30 DIAGNOSIS — S1016XA Insect bite (nonvenomous) of throat, initial encounter: Secondary | ICD-10-CM | POA: Diagnosis not present

## 2017-11-30 DIAGNOSIS — F411 Generalized anxiety disorder: Secondary | ICD-10-CM | POA: Diagnosis not present

## 2017-11-30 MED ORDER — HYDROXYZINE HCL 25 MG PO TABS: 25.0000 mg | ORAL_TABLET | Freq: Three times a day (TID) | ORAL | 0 refills | Status: DC | PRN

## 2017-11-30 NOTE — Progress Notes (Signed)
    MRN: [id number] DOB: 11/06/1975  Subjective:   Ann Sampson is a 42 y.o. female [id number] for acute onset of neck discomfort this morning that she thinks is from an insect bite.  She subsequently felt like she was having trouble breathing and had tongue swelling.  Patient is very anxious about having an anaphylactic reaction, has had one before and is worried that it would be the case today.  Denies fever, headache, facial swelling, chest tightness, shortness of breath, wheezing, nausea, vomiting, belly pain.  She took 2 Benadryl with some improvement.  Bjorn LoserRhonda has a current medication list which includes the following prescription(s): calcium carbonate-vit d-min, epinephrine, escitalopram, gabapentin, ibuprofen, and multiple vitamins-minerals. Also is allergic to bextra [valdecoxib]; flagyl [metronidazole]; prednisone; and vioxx [rofecoxib].  Bjorn LoserRhonda  has a past medical history of Allergy, Arthritis, Asthma, and Depression. Also  has a past surgical history that includes Breast surgery and Knee surgery.  Objective:   Vitals: BP 116/82   Pulse 96   Temp 98 F (36.7 C)   Resp 16   Ht 5' 4.37" (1.635 m)   Wt 169 lb 9.6 oz (76.9 kg)   LMP 11/03/2017   SpO2 98%   BMI 28.78 kg/m   Physical Exam  Constitutional: She is oriented to person, place, and time. She appears well-developed and well-nourished.  HENT:  Mouth/Throat: Oropharynx is clear and moist.  No tongue swelling, airways patent.  Speech is intact.  No facial swelling noted.  Eyes: Right eye exhibits no discharge. Left eye exhibits no discharge. No scleral icterus.  Neck: Normal range of motion. Neck supple.  Cardiovascular: Normal rate, regular rhythm and intact distal pulses. Exam reveals no gallop and no friction rub.  No murmur heard. Pulmonary/Chest: No stridor. No respiratory distress. She has no wheezes. She has no rales. She exhibits no tenderness.  Lymphadenopathy:    She has no cervical adenopathy.    Neurological: She is alert and oriented to person, place, and time.  Skin: Skin is warm and dry.  Psychiatric: She has a normal mood and affect.    Assessment and Plan :   Tongue irritation  Insect bite of throat, initial encounter  Generalized anxiety disorder  There is no obvious area on her neck that would suggest an abrasion or an insect bite.  Offered patient Vistaril in case she develops any itching or has a skin rash.  And may also help with her anxiety which she has a history of.  ER and return to clinic precautions discussed.  Wallis BambergMario Tabria Steines, PA-C Primary Care at [facility] [phone] 11/30/2017  11:35 AM

## 2017-11-30 NOTE — Patient Instructions (Addendum)
Anaphylactic Reaction An anaphylactic reaction (anaphylaxis) is a sudden allergic reaction that is very bad (severe). It also affects more than one part of the body. This condition can be life-threatening. If you have an anaphylactic reaction, you need to get medical help right away. You may need to stay in the hospital. Your doctor may teach you how to use an allergy kit (anaphylaxis kit) and how to give yourself an allergy shot (epinephrine injection). You can give yourself an allergy shot with what is commonly called an auto-injector "pen." Symptoms of an anaphylactic reaction may include:  A stuffy nose (nasal congestion).  Headache.  Tingling in your mouth.  A flushed face.  An itchy, red rash.  Swelling of your eyes, lips, face, or tongue.  Swelling of the back of your mouth and your throat.  Breathing loudly (wheezing).  A hoarse voice.  Itchy, red, swollen areas of skin (hives).  Dizziness or light-headedness.  Passing out (fainting).  Feeling worried or nervous (anxiety).  Feeling confused.  Pain in your belly (abdomen) or chest.  Trouble with breathing, talking, or swallowing.  A tight feeling in your chest or throat.  Fast or uneven heartbeats (palpitations).  Throwing up (vomiting).  Watery poop (diarrhea).  Follow these instructions at home: Safety  Always keep an auto-injector pen or your allergy kit with you. These could save your life. Use them as told by your doctor.  Do not drive until your doctor says that it is safe.  Make sure that you, the people who live with you, and your employer know: ? How to use your allergy kit. ? How to use an auto-injector pen to give you an allergy shot.  If you used your auto-injector pen: ? Get more medicine for it right away. This is important in case you have another reaction. ? Get help right away.  Wear a bracelet or necklace that says you have an allergy, if your doctor tells you to do this.  Learn  the signs of a very bad allergic reaction.  Work with your doctors to make a plan for what to do if you have a very bad allergic reaction. Being prepared is important. General instructions  Take over-the-counter and prescription medicines only as told by your doctor.  If you have itchy, red, swollen areas of skin or a rash: ? Use over-the-counter medicine (antihistamine) as told by your doctor. ? Put cold, wet cloths (cold compresses) on your skin. ? Take baths or showers in cool water. Avoid hot water.  If you had tests done, it is up to you to get your test results. Ask your doctor when your results will be ready.  Tell any doctors who care for you that you have an allergy.  Keep all follow-up visits as told by your doctor. This is important. How is this prevented?  Avoid things (allergens) that gave you a very bad allergic reaction before.  If you have a food allergy and you go to a restaurant, tell your server about your allergy. If you are not sure if your meal was made with food that you are allergic to, ask your server before you eat it. Contact a doctor if:  You have symptoms of an allergic reaction. You may notice them soon after being around whatever it is that you are allergic to. Symptoms may include: ? A rash. ? A headache. ? Sneezing or a runny nose. ? Swelling. ? Feeling sick to your stomach. ? Watery poop. Get help right   away if:  You had to use your auto-injector pen. You must go to the emergency room even if the medicine seems to be working.  You have any of these: ? A tight feeling in your chest or your throat. ? Loud breathing. ? Trouble with breathing. ? Itchy, red, swollen areas of skin. ? Red skin or itching all over your body. ? Swelling in your lips, tongue, or the back of your throat.  You have throwing up that gets very bad.  You have watery poop that gets very bad.  You pass out or feel like you might pass out. These symptoms may be an  emergency. Do not wait to see if the symptoms will go away. Use your auto-injector pen or allergy kit as you have been told. Get medical help right away. Call your local emergency services (911 in the [place] Do not drive yourself to the hospital. Summary  An anaphylactic reaction (anaphylaxis) is a sudden allergic reaction that is very bad (severe).  This condition can be life-threatening. If you have an anaphylactic reaction, you need to get medical help right away.  Your doctor may teach you how to use an allergy kit (anaphylaxis kit) and how to give yourself an allergy shot (epinephrine injection) with an auto-injector "pen."  Always keep an auto-injector pen or your allergy kit with you. These could save your life. Use them as told by your doctor.  If you had to use your auto-injector pen, you must go to the emergency room even if the medicine seems to be working. This information is not intended to replace advice given to you by your health care provider. Make sure you discuss any questions you have with your health care provider. Document Released: 12/03/2007 Document Revised: 02/08/2016 Document Reviewed: 02/08/2016 Elsevier Interactive Patient Education  2017 [employer].     IF you received an x-ray today, you will receive an invoice from [facility]. Please contact [facility] at [phone] with questions or concerns regarding your invoice.   IF you received labwork today, you will receive an invoice from [place]. Please contact [facility] at [phone] with questions or concerns regarding your invoice.   Our billing staff will not be able to assist you with questions regarding bills from these companies.  You will be contacted with the lab results as soon as they are available. The fastest way to get your results is to activate your My Chart account. Instructions are located on the last page of this paperwork. If you have not heard from [place] regarding the  results in 2 weeks, please contact this office.

## 2018-04-22 ENCOUNTER — Telehealth: Payer: Self-pay | Admitting: Physician Assistant

## 2018-04-22 NOTE — Telephone Encounter (Signed)
Called and spoke with pt regarding switching her appt from 05/07/18 due to provider schedule change.  I was able to get her a new appt on 05/05/18 at 11:40. I advised pt that she should be fasting and she will probably come in at 8:00 am to do labs and come back for the appt.   I advised of fasting, time, building and late policy.

## 2018-05-05 ENCOUNTER — Encounter: Payer: Self-pay | Admitting: Physician Assistant

## 2018-05-05 ENCOUNTER — Other Ambulatory Visit: Payer: Self-pay

## 2018-05-05 ENCOUNTER — Ambulatory Visit (INDEPENDENT_AMBULATORY_CARE_PROVIDER_SITE_OTHER): Payer: 59 | Admitting: Physician Assistant

## 2018-05-05 VITALS — BP 133/84 | HR 87 | Temp 98.4°F | Resp 16 | Ht 64.5 in | Wt 176.8 lb

## 2018-05-05 DIAGNOSIS — Z13220 Encounter for screening for lipoid disorders: Secondary | ICD-10-CM | POA: Diagnosis not present

## 2018-05-05 DIAGNOSIS — Z1329 Encounter for screening for other suspected endocrine disorder: Secondary | ICD-10-CM

## 2018-05-05 DIAGNOSIS — F411 Generalized anxiety disorder: Secondary | ICD-10-CM

## 2018-05-05 DIAGNOSIS — Z0000 Encounter for general adult medical examination without abnormal findings: Secondary | ICD-10-CM | POA: Diagnosis not present

## 2018-05-05 DIAGNOSIS — Z124 Encounter for screening for malignant neoplasm of cervix: Secondary | ICD-10-CM | POA: Diagnosis not present

## 2018-05-05 LAB — LIPID PANEL
Chol/HDL Ratio: 4.1 ratio (ref 0.0–4.4)
Cholesterol, Total: 245 mg/dL — ABNORMAL HIGH (ref 100–199)
HDL: 60 mg/dL (ref >39)
LDL Calculated: 162 mg/dL — ABNORMAL HIGH (ref 0–99)
Triglycerides: 116 mg/dL (ref 0–149)
VLDL Cholesterol Cal: 23 mg/dL (ref 5–40)

## 2018-05-05 MED ORDER — ESCITALOPRAM OXALATE 10 MG PO TABS: 10.0000 mg | ORAL_TABLET | Freq: Every day | ORAL | 3 refills | Status: DC

## 2018-05-05 NOTE — Patient Instructions (Addendum)
It was great to see you today. Look me up in [place]!   If you have lab work done today you will be contacted with your lab results within the next 2 weeks.  If you have not heard from [place] then please contact us. The fastest way to get your results is to register for My Chart.  Keeping You Healthy   Get These Tests  Blood Pressure- Have your blood pressure checked by your healthcare provider at least once a year.  Normal blood pressure is 120/80.  Weight- Have your body mass index (BMI) calculated to screen for obesity.  BMI is a measure of body fat based on height and weight.  You can calculate your own BMI at https://www.west-esparza.com/  Cholesterol- Have your cholesterol checked every year.  Diabetes- Have your blood sugar checked every year if you have high blood pressure, high cholesterol, a family history of diabetes or if you are overweight.  Pap Test - Have a pap test every 1 to 5 years if you have been sexually active.  If you are older than 65 and recent pap tests have been normal you may not need additional pap tests.  In addition, if you have had a hysterectomy  for benign disease additional pap tests are not necessary.  Mammogram-Yearly mammograms are essential for early detection of breast cancer  Screening for Colon Cancer- Colonoscopy starting at age 74. Screening may begin sooner depending on your family history and other health conditions.  Follow up colonoscopy as directed by your Gastroenterologist.  Screening for Osteoporosis- Screening begins at age 67 with bone density scanning, sooner if you are at higher risk for developing Osteoporosis.   Get these medicines  Calcium with Vitamin D- Your body requires 1200-1500 mg of Calcium a day and [phone] IU of Vitamin D a day.  You can only absorb 500 mg of Calcium at a time therefore Calcium must be taken in 2 or 3 separate doses throughout the day.  Hormones- Hormone therapy has been associated with increased risk for  certain cancers and heart disease.  Talk to your healthcare provider about if you need relief from menopausal symptoms.  Aspirin- Ask your healthcare provider about taking Aspirin to prevent Heart Disease and Stroke.   Get these Immuniztions  Flu shot- Every fall  Pneumonia shot- Once after the age of 12; if you are younger ask your healthcare provider if you need a pneumonia shot.  Tetanus- Every ten years.  Zostavax- Once after the age of 81 to prevent shingles.   Take these steps  Don't smoke- Your healthcare provider can help you quit. For tips on how to quit, ask your healthcare provider or go to www.smokefree.gov or call 1-800 QUIT-NOW.  Be physically active- Exercise 5 days a week for a minimum of 30 minutes.  If you are not already physically active, start slow and gradually work up to 30 minutes of moderate physical activity.  Try walking, dancing, bike riding, swimming, etc.  Eat a healthy diet- Eat a variety of healthy foods such as fruits, vegetables, whole grains, low fat milk, low fat cheeses, yogurt, lean meats, chicken, fish, eggs, dried beans, tofu, etc.  For more information go to www.thenutritionsource.org  Dental visit- Brush and floss teeth twice daily; visit your dentist twice a year.  Eye exam- Visit your Optometrist or Ophthalmologist yearly.  Drink alcohol in moderation- Limit alcohol intake to one drink or less a day.  Never drink and drive.  Depression- Your emotional health  is as important as your physical health.  If you're feeling down or losing interest in things you normally enjoy, please talk to your healthcare provider.  Seat Belts- can save your life; always wear one  Smoke/Carbon Monoxide detectors- These detectors need to be installed on the appropriate level of your home.  Replace batteries at least once a year.  Violence- If anyone is threatening or hurting you, please tell your healthcare provider.  Living Will/ Health care power of  attorney- Discuss with your healthcare provider and family.  IF you received an x-ray today, you will receive an invoice from [facility]. Please contact [facility] at [phone] with questions or concerns regarding your invoice.   IF you received labwork today, you will receive an invoice from [employer]. Please contact [facility] at [phone] with questions or concerns regarding your invoice.   Our billing staff will not be able to assist you with questions regarding bills from these companies.  You will be contacted with the lab results as soon as they are available. The fastest way to get your results is to activate your My Chart account. Instructions are located on the last page of this paperwork. If you have not heard from [place] regarding the results in 2 weeks, please contact this office.

## 2018-05-05 NOTE — Progress Notes (Signed)
Primary Care at [facility] [address], [place] [address] [phone]  Date:  05/05/2018   Name:  Ann Sampson   DOB:  1975-08-18   MRN:  [id number]  PCP:  Shade Flood, MD    Chief Complaint: Annual Exam (w/pap)  History of Present Illness:  This is a 42 y.o. female who is presenting for CPE. Pt  has a past medical history of Allergy, Arthritis, Asthma, and Depression.  Married with 3 year old son.   H/o Anxiety - controlled with Lexapro 10 mg x 6 months.  Also listens to motivational podcasts.   Cervical Cancer Screening: > 5 years ago Breast Cancer Screening: h/o breast implants x 11 years. Last MM 09/2016 which was negative.  Colorectal Cancer Screening: not yet a candidate Bone Density Testing: not yet a candidate STI Screening: not interested.  Seasonal Influenza Vaccination: utd Td/Tdap Vaccination: not utd Pneumococcal Vaccination: not yet a candidate Zoster Vaccination: not yet a candidate Frequency of Dental evaluation: regular checks Q6 months Frequency of Eye evaluation: > 1 year  Wt Readings from Last 3 Encounters:  05/05/18 176 lb 12.8 oz (80.2 kg)  11/30/17 169 lb 9.6 oz (76.9 kg)  11/04/17 170 lb (77.1 kg)   Review of Systems:  Review of Systems  Constitutional: Negative for chills, diaphoresis, fatigue and fever.  HENT: Negative for congestion, postnasal drip, rhinorrhea, sinus pressure, sinus pain, sneezing and sore throat.   Respiratory: Negative for cough, chest tightness, shortness of breath and wheezing.   Cardiovascular: Negative for chest pain and palpitations.  Gastrointestinal: Negative for abdominal pain, diarrhea, nausea and vomiting.  Genitourinary: Negative for decreased urine volume, difficulty urinating, dysuria, enuresis, flank pain, frequency, hematuria and urgency.  Musculoskeletal: Negative for back pain.  Neurological: Negative for dizziness, weakness, light-headedness and headaches.  Psychiatric/Behavioral: Negative for  sleep disturbance.    Patient Active Problem List   Diagnosis Date Noted  . Generalized anxiety disorder 11/03/2017  . Neuropathy 11/04/2016  . Paresthesias 11/04/2016    Prior to Admission medications   Medication Sig Start Date End Date Taking? Authorizing Provider  Calcium Carbonate-Vit D-Min (CALCIUM 1200 PO) Take by mouth.   Yes [provider]  EPINEPHRINE 0.3 mg/0.3 mL IJ SOAJ injection USE AS DIRECTED 10/07/17  Yes Sherren Mocha, MD  escitalopram (LEXAPRO) 10 MG tablet Take 1 tablet (10 mg total) by mouth daily. 11/03/17  Yes Shantaya Bluestone, Madelaine Bhat, PA-C  gabapentin (NEURONTIN) 300 MG capsule TAKE 1 CAPSULE DAILY 11/04/17  Yes Nilda Riggs, NP  Multiple Vitamins-Minerals (ADULT GUMMY PO) Take by mouth.   Yes [provider]  hydrOXYzine (ATARAX/VISTARIL) 25 MG tablet Take 1 tablet (25 mg total) by mouth every 8 (eight) hours as needed for itching (allergic rash). Patient not taking: Reported on 05/05/2018 11/30/17   Wallis Bamberg, PA-C    Allergies  Allergen Reactions  . Bextra [Valdecoxib]   . Flagyl [Metronidazole]   . Prednisone     Anaphylaxis   . Vioxx [Rofecoxib]     Past Surgical History:  Procedure Laterality Date  . BREAST SURGERY    . KNEE SURGERY      Social History   Tobacco Use  . Smoking status: Former Games developer  . Smokeless tobacco: Never Used  Substance Use Topics  . Alcohol use: Yes    Alcohol/week: 1.0 standard drinks    Types: 1 Glasses of wine per week    Comment: occcasionally  . Drug use: No    Family  History  Problem Relation Age of Onset  . Skin cancer Mother   . Asthma Son   . Seizures Maternal Grandmother   . Cancer Maternal Grandmother   . Neuropathy Maternal Grandfather   . Diabetic kidney disease Maternal Grandfather     Medication list has been reviewed and updated.  Physical Examination:  Physical Exam  Constitutional: She is oriented to person, place, and time. No distress.  HENT:  Right Ear:  Tympanic membrane normal.  Left Ear: Tympanic membrane normal.  Nose: Mucosal edema present. No rhinorrhea. Right sinus exhibits no maxillary sinus tenderness and no frontal sinus tenderness. Left sinus exhibits no maxillary sinus tenderness and no frontal sinus tenderness.  Mouth/Throat: Oropharynx is clear and moist and mucous membranes are normal.  Cardiovascular: Normal rate, regular rhythm and normal heart sounds.  Pulmonary/Chest: Effort normal and breath sounds normal. No respiratory distress. She has no wheezes. She has no rales. Right breast exhibits no mass and no skin change. Left breast exhibits no mass and no skin change.  Abdominal: Soft. There is no tenderness. There is no guarding.  Genitourinary: Vagina normal. Cervix exhibits no motion tenderness, no discharge and no friability. Right adnexum displays no mass and no tenderness. Left adnexum displays no mass and no tenderness.  Neurological: She is alert and oriented to person, place, and time.  Skin: Skin is warm and dry.  Psychiatric: Judgment normal.  Vitals reviewed.  BP 133/84 (BP Location: Right Arm, Patient Position: Sitting, Cuff Size: Normal)   Pulse 87   Temp 98.4 F (36.9 C) (Oral)   Resp 16   Ht 5' 4.5" (1.638 m)   Wt 176 lb 12.8 oz (80.2 kg)   LMP 04/28/2018   SpO2 98%   BMI 29.88 kg/m   Assessment and Plan: 1. Annual physical exam - pt presents for annual exam. Feeling well today. PAP done today. Routine labs are pending. Refills for Lexapro - anxiety controlled. Anticipatory guidance discussed. Will contact with lab results.   2. Screening for cervical cancer - Pap IG and HPV (high risk) DNA detection  3. Screening, lipid - Lipid panel  4. Screening for endocrine disorder - Urinalysis, dipstick only   Marco Collie, PA-C  Primary Care at [facility] 05/05/2018 11:55 AM

## 2018-05-06 LAB — URINALYSIS, DIPSTICK ONLY
Bilirubin, UA: NEGATIVE
Glucose, UA: NEGATIVE
Ketones, UA: NEGATIVE
Leukocytes, UA: NEGATIVE
Nitrite, UA: NEGATIVE
Protein, UA: NEGATIVE
RBC, UA: NEGATIVE
Specific Gravity, UA: 1.005 (ref 1.005–1.030)
Urobilinogen, Ur: 0.2 mg/dL (ref 0.2–1.0)
pH, UA: 7.5 (ref 5.0–7.5)

## 2018-05-07 ENCOUNTER — Encounter: Payer: 59 | Admitting: Physician Assistant

## 2018-05-08 LAB — PAP IG AND HPV HIGH-RISK: HPV, high-risk: NEGATIVE

## 2018-06-21 ENCOUNTER — Telehealth: Payer: Self-pay | Admitting: Family Medicine

## 2018-06-21 NOTE — Telephone Encounter (Unsigned)
Copied from CRM [id number]. Topic: General - Other >> Jun 21, 2018  1:38 PM Jilda Rocheemaray, Melissa wrote: Reason for CRM: Patients son was diagnosed with the flu today and she would like to know if she can get a prescription for Tamaflu, please advise.  Best call back is [phone]

## 2018-06-21 NOTE — Telephone Encounter (Signed)
Copied from CRM #[id number]. Topic: General - Other °>> Jun 21, 2018  1:38 PM Demaray, Melissa wrote: °Reason for CRM: Patients son was diagnosed with the flu today and she would like to know if she can get a prescription for Tamaflu, please advise. ° °Best call back is [phone] °

## 2018-06-24 ENCOUNTER — Other Ambulatory Visit: Payer: Self-pay | Admitting: *Deleted

## 2018-06-24 DIAGNOSIS — Z20828 Contact with and (suspected) exposure to other viral communicable diseases: Secondary | ICD-10-CM

## 2018-06-24 MED ORDER — OSELTAMIVIR PHOSPHATE 75 MG PO CAPS: ORAL_CAPSULE | ORAL | 0 refills | Status: DC

## 2018-07-29 ENCOUNTER — Other Ambulatory Visit: Payer: Self-pay | Admitting: Emergency Medicine

## 2018-07-29 ENCOUNTER — Telehealth: Payer: Self-pay | Admitting: Family Medicine

## 2018-07-29 DIAGNOSIS — F411 Generalized anxiety disorder: Secondary | ICD-10-CM

## 2018-07-29 MED ORDER — ESCITALOPRAM OXALATE 10 MG PO TABS: 10.0000 mg | ORAL_TABLET | Freq: Every day | ORAL | 1 refills | Status: DC

## 2018-07-29 NOTE — Telephone Encounter (Signed)
Copied from CRM [id number]. Topic: Quick Communication - See Telephone Encounter >> Jul 29, 2018 11:19 AM Windy Kalata, NT wrote: CRM for notification. See Telephone encounter for: 07/29/18.  Ann Sampson from [facility] pharmacy is calling and states the patient is needing a refill on escitalopram (LEXAPRO) 10 MG tablet  and when they send a request it states that she is not a patient at this practice. Please advise.  [facility] [place], [place] - [address] [place] [address] Phone: [phone] Fax: [phone]

## 2018-07-29 NOTE — Telephone Encounter (Signed)
Patient was a patient of [facility] but is now a [place] patient patient was just seen in Nov. So gave a refill until able to get in to see Dr Neva Seat

## 2018-09-28 ENCOUNTER — Encounter: Payer: Self-pay | Admitting: Family Medicine

## 2018-09-28 DIAGNOSIS — F411 Generalized anxiety disorder: Secondary | ICD-10-CM

## 2018-09-28 MED ORDER — ESCITALOPRAM OXALATE 20 MG PO TABS: 20.0000 mg | ORAL_TABLET | Freq: Every day | ORAL | 0 refills | Status: DC

## 2018-09-28 NOTE — Telephone Encounter (Signed)
See my chart email, increased anxiety with current COVID-19 pandemic.  Agreed on increased dose of 20 mg Lexapro for now, but recommended telemedicine visit to discuss symptoms further and other ways to help with sleep.

## 2018-10-02 ENCOUNTER — Other Ambulatory Visit: Payer: Self-pay | Admitting: Family Medicine

## 2018-10-02 MED ORDER — EPINEPHRINE 0.3 MG/0.3ML IJ SOAJ: INTRAMUSCULAR | 0 refills | Status: DC

## 2018-11-03 ENCOUNTER — Telehealth: Payer: Self-pay

## 2018-11-03 NOTE — Telephone Encounter (Signed)
I called pt that her visit will be change to video visit due to COVID 19 for the safety of pt and our medical providers. I receive verbal consent to do visit and to file insurance. PT has a apple phone with camera. I updated PCP, meds and pharmacy. I stated provider will text message to [phone] 10 -5 minutes prior to appt. Click the link 5-10 minutes before your scheduled appt time, enter your First and Last name, and the doctor will be with you shortly. She verbalized understanding.

## 2018-11-08 ENCOUNTER — Ambulatory Visit: Payer: 59 | Admitting: Adult Health

## 2018-11-08 ENCOUNTER — Other Ambulatory Visit: Payer: Self-pay

## 2018-11-08 ENCOUNTER — Ambulatory Visit: Payer: 59 | Admitting: Nurse Practitioner

## 2018-11-09 ENCOUNTER — Ambulatory Visit (INDEPENDENT_AMBULATORY_CARE_PROVIDER_SITE_OTHER): Payer: 59 | Admitting: Adult Health

## 2018-11-09 ENCOUNTER — Encounter: Payer: Self-pay | Admitting: Adult Health

## 2018-11-09 ENCOUNTER — Other Ambulatory Visit: Payer: Self-pay

## 2018-11-09 DIAGNOSIS — R200 Anesthesia of skin: Secondary | ICD-10-CM

## 2018-11-09 DIAGNOSIS — R202 Paresthesia of skin: Secondary | ICD-10-CM

## 2018-11-09 MED ORDER — GABAPENTIN 300 MG PO CAPS: 300.0000 mg | ORAL_CAPSULE | Freq: Every day | ORAL | 11 refills | Status: DC

## 2018-11-09 NOTE — Progress Notes (Signed)
I agree with the above plan 

## 2018-11-09 NOTE — Progress Notes (Signed)
[facility]  PATIENT: Ann Sampson DOB: 10/27/1975   REASON FOR VISIT: Follow-up for paresthesias HISTORY FROM: Patient  Virtual Visit via Video Note  I connected with Ann Sampson on 11/09/18 at 10:45 AM EDT by a video enabled telemedicine application located remotely in my own home and verified that I am speaking with the correct person using two identifiers who was located at their own home.   I discussed the limitations of evaluation and management by telemedicine and the availability of in person appointments. The patient expressed understanding and agreed to proceed.   HISTORY OF PRESENT ILLNESS  Ann Sampson is a 43 y.o. female who continues to be followed in this office for bilateral paresthesias located feet and fingertips.  She was initially scheduled today for one-year follow-up but due to COVID-19 safety precautions, visit transition to telemedicine via [email or web address] with patient's consent.  Prior lab work and EMG unremarkable for treatable causes of neuropathy.  Neuropathy has been stable with use of gabapentin 300 mg nightly.  She does endorse occasional "cattle prod" sensation that radiates down her leg approximately 2 times per month.  Sensation will occur several times in a row lasting only seconds and then resolve.  She continues to stay active without difficulty.  No further concerns at this time.   REVIEW OF SYSTEMS: Full 14 system review of systems performed and notable only for those listed, all others are neg:  Numbness/tingling, nerve pain   ALLERGIES: Allergies  Allergen Reactions  . Bextra [Valdecoxib]   . Flagyl [Metronidazole]   . Prednisone     Anaphylaxis   . Vioxx [Rofecoxib]     HOME MEDICATIONS: Outpatient Medications Prior to Visit  Medication Sig Dispense Refill  . Calcium Carbonate-Vit D-Min (CALCIUM 1200 PO) Take by mouth.    . EPINEPHrine 0.3 mg/0.3 mL IJ SOAJ injection USE AS DIRECTED 1 Device 0  . escitalopram  (LEXAPRO) 20 MG tablet Take 1 tablet (20 mg total) by mouth daily. 90 tablet 0  . gabapentin (NEURONTIN) 300 MG capsule TAKE 1 CAPSULE DAILY 30 capsule 11  . Multiple Vitamins-Minerals (ADULT GUMMY PO) Take by mouth.    . oseltamivir (TAMIFLU) 75 MG capsule Take 2 times daily for 5 days 10 capsule 0   No facility-administered medications prior to visit.     PAST MEDICAL HISTORY: Past Medical History:  Diagnosis Date  . Allergy   . Arthritis   . Asthma   . Depression     PAST SURGICAL HISTORY: Past Surgical History:  Procedure Laterality Date  . BREAST SURGERY    . KNEE SURGERY      FAMILY HISTORY: Family History  Problem Relation Age of Onset  . Skin cancer Mother   . Asthma Son   . Seizures Maternal Grandmother   . Cancer Maternal Grandmother   . Stroke Maternal Grandmother   . Neuropathy Maternal Grandfather   . Diabetic kidney disease Maternal Grandfather   . Diabetes Maternal Grandfather   . Heart disease Paternal Grandfather   . Hyperlipidemia Paternal Grandfather   . Hypertension Paternal Grandfather     SOCIAL HISTORY: Social History   Socioeconomic History  . Marital status: Divorced    Spouse name: Not on file  . Number of children: Not on file  . Years of education: Not on file  . Highest education level: Not on file  Occupational History  . Not on file  Social Needs  . Financial resource strain: Not on file  .  Food insecurity:    Worry: Not on file    Inability: Not on file  . Transportation needs:    Medical: Not on file    Non-medical: Not on file  Tobacco Use  . Smoking status: Former Games developermoker  . Smokeless tobacco: Never Used  Substance and Sexual Activity  . Alcohol use: Yes    Alcohol/week: 1.0 standard drinks    Types: 1 Glasses of wine per week    Comment: occcasionally  . Drug use: No  . Sexual activity: Not on file  Lifestyle  . Physical activity:    Days per week: Not on file    Minutes per session: Not on file  . Stress: Not  on file  Relationships  . Social connections:    Talks on phone: Not on file    Gets together: Not on file    Attends religious service: Not on file    Active member of club or organization: Not on file    Attends meetings of clubs or organizations: Not on file    Relationship status: Not on file  . Intimate partner violence:    Fear of current or ex partner: Not on file    Emotionally abused: Not on file    Physically abused: Not on file    Forced sexual activity: Not on file  Other Topics Concern  . Not on file  Social History Narrative  . Not on file     PHYSICAL EXAM  Generalized: Well developed, pleasant middle-aged female, in no acute distress  Head: normocephalic and atraumatic,. Oropharynx benign   Neurological examination   Mentation: Alert oriented to time, place, history taking. Attention span and concentration appropriate. Recent and remote memory intact.  Follows all commands speech and language fluent.   Cranial nerve II-XII: extraocular movements were full,  Facial sensation intact.  Hearing intact to voice.  Uvula tongue midline.  Shoulder shrug symmetric. Motor: No evidence of weakness per direct assessment Sensory: Intact to light touch with prior assessment finding decreased vibratory sensation Coordination: finger-nose-finger, heel-to-shin bilaterally, no dysmetria Reflexes: UTA Gait and Station: Rising up from seated position without assistance, normal stance, normal gait.  Tandem gait is steady  DIAGNOSTIC DATA (LABS, IMAGING, TESTING) - I reviewed patient records, labs, notes, testing and imaging myself where available.  Lab Results  Component Value Date   WBC 5.2 10/07/2017   HGB 14.4 10/07/2017   HCT 42.3 10/07/2017   MCV 92 10/07/2017   PLT 267 10/07/2017      Component Value Date/Time   NA 139 10/07/2017 1631   K 4.2 10/07/2017 1631   CL 98 10/07/2017 1631   CO2 25 10/07/2017 1631   GLUCOSE 129 (H) 10/07/2017 1631   GLUCOSE 94  07/10/2014 1609   BUN 3 (L) 10/07/2017 1631   CREATININE 0.67 10/07/2017 1631   CREATININE 0.68 07/10/2014 1609   CALCIUM 9.4 10/07/2017 1631   PROT 6.7 10/07/2017 1631   ALBUMIN 4.4 10/07/2017 1631   AST 18 10/07/2017 1631   ALT 9 10/07/2017 1631   ALKPHOS 50 10/07/2017 1631   BILITOT 0.3 10/07/2017 1631   GFRNONAA 109 10/07/2017 1631   GFRNONAA >89 07/10/2014 1609   GFRAA 125 10/07/2017 1631   GFRAA >89 07/10/2014 1609    Lab Results  Component Value Date   VITAMINB12 616 10/07/2017   Lab Results  Component Value Date   TSH 1.430 10/07/2017      ASSESSMENT AND PLAN 43 year old Caucasian lady with  4.5 year history of  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  EMG nerve conduction was normal without evidence of neuropathy or radiculopathy. Lab work to check for treatable causes are unremarkable.  Paresthesias have been stable.   Continue Gabapentin 300mg  at night -refill placed Advised to notify office if neuropathy/paresthesias worsen Follow up in 1 year or call earlier if needed  Greater than 50% of time during this 15 minute non-face-to-face visit was spent on counseling with discussion regarding paresthesias and use of gabapentin, planning of further management, and answering all questions to patient satisfaction  George Hugh, AGNP-BC  [facility] [address] [place], [place] [address]  Phone [phone] Fax [phone] Note: This document was prepared with digital dictation and possible smart phrase technology. Any transcriptional errors that result from this process are unintentional.

## 2018-11-24 ENCOUNTER — Ambulatory Visit: Payer: Self-pay | Admitting: *Deleted

## 2018-11-24 NOTE — Telephone Encounter (Signed)
Pt called stating that someone in her house is suspected to have COVID; he is awaiting test results; the pt says that she is not exhibiting symptoms, but needs a note for work; nurse triage initiated, and recommendations made per protocol; she verbalizes understanding; pt transferred to [facility] for scheduling. Reason for Disposition . 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 CLOSE CONTACT: "Who is the person with the confirmed or suspected COVID-19 infection that you were exposed to?"     Suspected in house mate 2. PLACE of CONTACT: "Where were you when you were exposed to COVID-19?" (e.g., home, school, medical waiting room; which city?)     house 3. TYPE of CONTACT: "How much contact was there?" (e.g., sitting next to, live in same house, work in same office, same building)     Same house 4. DURATION of CONTACT: "How long were you in contact with the COVID-19 patient?" (e.g., a few seconds, passed by person, a few minutes, live with the patient)     Lives with patient 5. DATE of CONTACT: "When did you have contact with a COVID-19 patient?" (e.g., how many days ago)   Lives with pt 6. TRAVEL: "Have you traveled out of the country recently?" If so, "When and where?"     * Also ask about out-of-state travel, since the CDC has identified some high risk cities for community spread in the [place].     * Note: Travel becomes less relevant if there is widespread community transmission where the patient lives.     no 7. COMMUNITY SPREAD: "Are there lots of cases of COVID-19 (community spread) where you live?" (See public health department website, if unsure)   * MAJOR community spread: high number of cases; numbers of cases are  increasing; many people hospitalized.   * MINOR community spread: low number of cases; not increasing; few or no people hospitalized     major 8. SYMPTOMS: "Do you have any symptoms?" (e.g., fever, cough, breathing difficulty)     no 9. PREGNANCY OR POSTPARTUM: "Is there any chance you are pregnant?" "When was your last menstrual period?" "Did you deliver in the last 2 weeks?"     No LMP 10/31/2018 10. HIGH RISK: "Do you have any heart or lung problems? Do you have a weak immune system?" (e.g., CHF, COPD, asthma, HIV positive, chemotherapy, renal failure, diabetes mellitus, sickle cell anemia)      Autoimmune issues  Protocols used: CORONAVIRUS (COVID-19) EXPOSURE-A-AH

## 2018-11-26 NOTE — Telephone Encounter (Signed)
FYI  Called pt for follow up on COVID exposure  The pt husband has been tested Positive (test confirmed yesterday 11/25/18) and he is quarteratine himself at home in their bedroom since Sunday night.  The pt and their son is now living Downstairs and gives husband disposable food containers to decrease contamination. Pt husband is being treated virtually via tele med by [facility].   Pt and her 44 yr old son do not have sx and have not left the house since Sunday.  Pt will be getting tested for COVID by the health department on Tue. June 2nd and instructed her to call us if she develops any sx and if she is having trouble breathing then got to the ED. In order to get her son tested she may have to contact his pediatrian.

## 2019-01-31 ENCOUNTER — Telehealth: Payer: Self-pay | Admitting: Family Medicine

## 2019-01-31 DIAGNOSIS — F411 Generalized anxiety disorder: Secondary | ICD-10-CM

## 2019-01-31 MED ORDER — ESCITALOPRAM OXALATE 20 MG PO TABS: 20.0000 mg | ORAL_TABLET | Freq: Every day | ORAL | 0 refills | Status: DC

## 2019-01-31 NOTE — Telephone Encounter (Signed)
Pt is requesting a refill on her anxiety medication

## 2019-01-31 NOTE — Telephone Encounter (Signed)
Noted appt pending in 1 week.  Refilled.

## 2019-01-31 NOTE — Telephone Encounter (Signed)
Pt needs med refill for anti-anxiety medication. Pt has appt on 8/10

## 2019-02-07 ENCOUNTER — Ambulatory Visit: Payer: 59 | Admitting: Family Medicine

## 2019-02-07 ENCOUNTER — Encounter: Payer: Self-pay | Admitting: Family Medicine

## 2019-02-07 ENCOUNTER — Other Ambulatory Visit: Payer: Self-pay

## 2019-02-07 VITALS — BP 140/81 | HR 97 | Temp 97.8°F | Resp 12 | Wt 193.8 lb

## 2019-02-07 DIAGNOSIS — F411 Generalized anxiety disorder: Secondary | ICD-10-CM | POA: Diagnosis not present

## 2019-02-07 DIAGNOSIS — J3489 Other specified disorders of nose and nasal sinuses: Secondary | ICD-10-CM | POA: Diagnosis not present

## 2019-02-07 DIAGNOSIS — Z23 Encounter for immunization: Secondary | ICD-10-CM

## 2019-02-07 MED ORDER — BACTROBAN NASAL 2 % NA OINT: 1.0000 "application " | TOPICAL_OINTMENT | Freq: Two times a day (BID) | NASAL | 2 refills | Status: DC

## 2019-02-07 MED ORDER — ESCITALOPRAM OXALATE 20 MG PO TABS: 20.0000 mg | ORAL_TABLET | Freq: Every day | ORAL | 1 refills | Status: DC

## 2019-02-07 NOTE — Progress Notes (Signed)
Subjective:    Patient ID: Ann Sampson, female    DOB: 07/15/1975, 43 y.o.   MRN: [id number]  HPI Ann LarkRhonda M Sampson is a 43 y.o. female Presents today for: Chief Complaint  Patient presents with  . Anxiety    Patient need a refill on lexapro. GAD7=10    Generalized anxiety disorder: See my chart message in March.  Decide on increased dose of Lexapro at that time 20 mg.  Evaluated by neurology, most recently video visit 912.  Bilateral paresthesias in feet and fingertips.  Previous lab work and EMG unremarkable for treatable causes of neuropathy.  Has been stable with use of gabapentin 300 mg nightly.  Feels better on 20mg  dose. Less feeling of chest tightness,, better sleep and focus. No current therapist.  Feels like anxiety managed better.  Work is going better. Feels like more relaxed now.  Would like ot remain on same daose of Lexapro.    GAD 7 : Generalized Anxiety Score 02/07/2019  Nervous, Anxious, on Edge 0  Control/stop worrying 3  Worry too much - different things 3  Trouble relaxing 3  Restless 1  Easily annoyed or irritable 0  Afraid - awful might happen 0  Total GAD 7 Score 10    [facility] 02/07/2019 05/05/2018 11/30/2017 11/03/2017 10/07/2017  Decreased Interest 0 0 0 0 0  Down, Depressed, Hopeless 0 0 0 0 0  PHQ - 2 Score 0 0 0 0 0    Nose sores.  Past year.  Usually left nostril. Goes away for awhile then flares. Raw areas, no blisters, no cold sores.  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    Patient Active Problem List   Diagnosis Date Noted  . Generalized anxiety disorder 11/03/2017  . Neuropathy 11/04/2016  . Paresthesias 11/04/2016   Past Medical History:  Diagnosis Date  . Allergy   . Arthritis   . Asthma   . Depression    Past Surgical History:  Procedure Laterality Date  . BREAST SURGERY    . KNEE SURGERY     Allergies  Allergen Reactions  . Bextra [Valdecoxib]   . Flagyl [Metronidazole]   . Prednisone    Anaphylaxis   . Vioxx [Rofecoxib]    Prior to Admission medications   Medication Sig Start Date End Date Taking? Authorizing Provider  escitalopram (LEXAPRO) 20 MG tablet Take 1 tablet (20 mg total) by mouth daily. 01/31/19  Yes Shade FloodGreene, Treshaun Carrico R, MD  gabapentin (NEURONTIN) 300 MG capsule Take 1 capsule (300 mg total) by mouth at bedtime. TAKE 1 CAPSULE DAILY 11/09/18  Yes George HughVanschaick, Jessica, NP  Multiple Vitamins-Minerals (ADULT GUMMY PO) Take by mouth.   Yes [provider]  EPINEPHrine 0.3 mg/0.3 mL IJ SOAJ injection USE AS DIRECTED 10/02/18   Shade FloodGreene, Gregori Abril R, MD   Social History   Socioeconomic History  . Marital status: Divorced    Spouse name: Not on file  . Number of children: Not on file  . Years of education: Not on file  . Highest education level: Not on file  Occupational History  . Not on file  Social Needs  . Financial resource strain: Not on file  . Food insecurity    Worry: Not on file    Inability: Not on file  . Transportation needs    Medical: Not on file    Non-medical: Not on file  Tobacco Use  . Smoking status: Former Games developermoker  . Smokeless tobacco: Never Used  Substance and Sexual Activity  . Alcohol use: Yes    Alcohol/week: 1.0 standard drinks    Types: 1 Glasses of wine per week    Comment: occcasionally  . Drug use: No  . Sexual activity: Not on file  Lifestyle  . Physical activity    Days per week: Not on file    Minutes per session: Not on file  . Stress: Not on file  Relationships  . Social Musicianconnections    Talks on phone: Not on file    Gets together: Not on file    Attends religious service: Not on file    Active member of club or organization: Not on file    Attends meetings of clubs or organizations: Not on file    Relationship status: Not on file  . Intimate partner violence    Fear of current or ex partner: Not on file    Emotionally abused: Not on file    Physically abused: Not on file    Forced sexual activity: Not on file   Other Topics Concern  . Not on file  Social History Narrative  . Not on file    Review of Systems Per HPI.     Objective:   Physical Exam Vitals signs reviewed.  Constitutional:      Appearance: She is well-developed.  HENT:     Head: Normocephalic and atraumatic.     Nose:     Comments: 2 small shallow ulcerated areas inside left nares, no nare swelling, no external rash, no bleeding/discharge.  Eyes:     Conjunctiva/sclera: Conjunctivae normal.     Pupils: Pupils are equal, round, and reactive to light.  Neck:     Vascular: No carotid bruit.  Cardiovascular:     Rate and Rhythm: Normal rate and regular rhythm.     Heart sounds: Normal heart sounds.  Pulmonary:     Effort: Pulmonary effort is normal.     Breath sounds: Normal breath sounds.  Abdominal:     Palpations: Abdomen is soft. There is no pulsatile mass.     Tenderness: There is no abdominal tenderness.  Skin:    General: Skin is warm and dry.  Neurological:     Mental Status: She is alert and oriented to person, place, and time.  Psychiatric:        Mood and Affect: Mood normal.        Behavior: Behavior normal.        Thought Content: Thought content normal.    Vitals:   02/07/19 1527  BP: 140/81  Pulse: 97  Resp: 12  Temp: 97.8 F (36.6 C)  TempSrc: Oral  SpO2: 97%  Weight: 193 lb 12.8 oz (87.9 kg)          Assessment & Plan:   Ann LarkRhonda M Sampson is a 43 y.o. female Generalized anxiety disorder - Plan: escitalopram (LEXAPRO) 20 MG tablet  -Improved with coping techniques and higher dose of Lexapro, 20 mg daily, continue same, recheck 6 months  Need for prophylactic vaccination with combined diphtheria-tetanus-pertussis (DTP) vaccine - Plan: Tdap vaccine greater than or equal to 7yo IM  Nasal sore - Plan: mupirocin nasal ointment (BACTROBAN NASAL) 2 %  -Bactroban ointment twice per day to affected area for 5 days when flared.  If persistent recurrence or any vesicular lesions, would  consider HSV as possible cause and possible antiviral Rx. RTC precautions given.  Meds ordered this encounter  Medications  . escitalopram (LEXAPRO) 20 MG tablet  Sig: Take 1 tablet (20 mg total) by mouth daily.    Dispense:  90 tablet    Refill:  1  . mupirocin nasal ointment (BACTROBAN NASAL) 2 %    Sig: Place 1 application into the nose 2 (two) times daily. Use one-half of tube in each nostril twice daily for five (5) days. After application, press sides of nose together and gently massage.    Dispense:  10 g    Refill:  2   Patient Instructions    Bactroban nasal twice per day for 5 days for irritated nostril only.  Can repeat if symptoms flare in the future, but if that is recurrent I would recommend other testing.  Please follow-up if that is the case.  If the medication is too expensive, let me know as we may be able to write it differently.  No change in lexapro dose, recheck in 6 months. Thanks for coming in today.   If you have lab work done today you will be contacted with your lab results within the next 2 weeks.  If you have not heard from [place] then please contact us. The fastest way to get your results is to register for My Chart.   IF you received an x-ray today, you will receive an invoice from [facility]. Please contact [facility] at [phone] with questions or concerns regarding your invoice.   IF you received labwork today, you will receive an invoice from [place]. Please contact [facility] at [phone] with questions or concerns regarding your invoice.   Our billing staff will not be able to assist you with questions regarding bills from these companies.  You will be contacted with the lab results as soon as they are available. The fastest way to get your results is to activate your My Chart account. Instructions are located on the last page of this paperwork. If you have not heard from [place] regarding the results in 2 weeks, please contact this  office.       Signed,   Merri Ray, MD [facility] [place].  02/08/19 11:38 AM

## 2019-02-07 NOTE — Patient Instructions (Addendum)
  Bactroban nasal twice per day for 5 days for irritated nostril only.  Can repeat if symptoms flare in the future, but if that is recurrent I would recommend other testing.  Please follow-up if that is the case.  If the medication is too expensive, let me know as we may be able to write it differently.  No change in lexapro dose, recheck in 6 months. Thanks for coming in today.   If you have lab work done today you will be contacted with your lab results within the next 2 weeks.  If you have not heard from [place] then please contact us. The fastest way to get your results is to register for My Chart.   IF you received an x-ray today, you will receive an invoice from [facility]. Please contact [facility] at [phone] with questions or concerns regarding your invoice.   IF you received labwork today, you will receive an invoice from [place]. Please contact [facility] at [phone] with questions or concerns regarding your invoice.   Our billing staff will not be able to assist you with questions regarding bills from these companies.  You will be contacted with the lab results as soon as they are available. The fastest way to get your results is to activate your My Chart account. Instructions are located on the last page of this paperwork. If you have not heard from [place] regarding the results in 2 weeks, please contact this office.

## 2019-02-08 ENCOUNTER — Encounter: Payer: Self-pay | Admitting: Family Medicine

## 2019-02-09 ENCOUNTER — Encounter: Payer: Self-pay | Admitting: Family Medicine

## 2019-02-15 ENCOUNTER — Telehealth: Payer: 59 | Admitting: Family

## 2019-02-15 DIAGNOSIS — W57XXXA Bitten or stung by nonvenomous insect and other nonvenomous arthropods, initial encounter: Secondary | ICD-10-CM

## 2019-02-15 DIAGNOSIS — S40862A Insect bite (nonvenomous) of left upper arm, initial encounter: Secondary | ICD-10-CM

## 2019-02-15 NOTE — Progress Notes (Signed)
E Visit for Rash  We are sorry that you are not feeling well. Here is how we plan to help!  I recommend you take Benadryl 25 mg - 50 mg every 4 hours to control the symptoms but if they last over 24 hours it is best that you see an office based provider for follow up. You may also apply hydrocortisone cream twice a day   HOME CARE:   Take cool showers and avoid direct sunlight.  Apply cool compress or wet dressings.  Take a bath in an oatmeal bath.  Sprinkle content of one Aveeno packet under running faucet with comfortably warm water.  Bathe for 15-20 minutes, 1-2 times daily.  Pat dry with a towel. Do not rub the rash.  Use hydrocortisone cream.  Take an antihistamine like Benadryl for widespread rashes that itch.  The adult dose of Benadryl is 25-50 mg by mouth 4 times daily.  Caution:  This type of medication may cause sleepiness.  Do not drink alcohol, drive, or operate dangerous machinery while taking antihistamines.  Do not take these medications if you have prostate enlargement.  Read package instructions thoroughly on all medications that you take.  GET HELP RIGHT AWAY IF:   Symptoms don't go away after treatment.  Severe itching that persists.  If you rash spreads or swells.  If you rash begins to smell.  If it blisters and opens or develops a yellow-brown crust.  You develop a fever.  You have a sore throat.  You become short of breath.  MAKE SURE YOU:  Understand these instructions. Will watch your condition. Will get help right away if you are not doing well or get worse.  Thank you for choosing an e-visit. Your e-visit answers were reviewed by a board certified advanced clinical practitioner to complete your personal care plan. Depending upon the condition, your plan could have included both over the counter or prescription medications. Please review your pharmacy choice. Be sure that the pharmacy you have chosen is open so that you can pick up your  prescription now.  If there is a problem you may message your provider in [place] to have the prescription routed to another pharmacy. Your safety is important to [place]. If you have drug allergies check your prescription carefully.  For the next 24 hours, you can use MyChart to ask questions about today's visit, request a non-urgent call back, or ask for a work or school excuse from your e-visit provider. You will get an email in the next two days asking about your experience. I hope that your e-visit has been valuable and will speed your recovery.    Greater than 5 minutes, yet less than 10 minutes of time have been spent researching, coordinating, and implementing care for this patient today.  Thank you for the details you included in the comment boxes. Those details are very helpful in determining the best course of treatment for you and help [place] to provide the best care.

## 2019-05-13 ENCOUNTER — Telehealth: Payer: Self-pay | Admitting: Family Medicine

## 2019-05-13 NOTE — Telephone Encounter (Signed)
LVM to r/s appt on 08/09/2019 with Dr. Carlota Raspberry because provider will be out of the office

## 2019-08-09 ENCOUNTER — Ambulatory Visit: Payer: 59 | Admitting: Family Medicine

## 2019-08-10 ENCOUNTER — Other Ambulatory Visit: Payer: Self-pay

## 2019-08-10 ENCOUNTER — Encounter: Payer: Self-pay | Admitting: Family Medicine

## 2019-08-10 ENCOUNTER — Ambulatory Visit: Payer: 59 | Admitting: Family Medicine

## 2019-08-10 VITALS — BP 148/87 | HR 97 | Temp 98.3°F | Ht 64.5 in | Wt 184.0 lb

## 2019-08-10 DIAGNOSIS — Z91038 Other insect allergy status: Secondary | ICD-10-CM | POA: Diagnosis not present

## 2019-08-10 DIAGNOSIS — M25519 Pain in unspecified shoulder: Secondary | ICD-10-CM | POA: Diagnosis not present

## 2019-08-10 DIAGNOSIS — Z87892 Personal history of anaphylaxis: Secondary | ICD-10-CM | POA: Diagnosis not present

## 2019-08-10 DIAGNOSIS — F411 Generalized anxiety disorder: Secondary | ICD-10-CM

## 2019-08-10 DIAGNOSIS — R208 Other disturbances of skin sensation: Secondary | ICD-10-CM

## 2019-08-10 DIAGNOSIS — M62838 Other muscle spasm: Secondary | ICD-10-CM

## 2019-08-10 MED ORDER — ESCITALOPRAM OXALATE 20 MG PO TABS: 20.0000 mg | ORAL_TABLET | Freq: Every day | ORAL | 1 refills | Status: DC

## 2019-08-10 MED ORDER — EPINEPHRINE 0.3 MG/0.3ML IJ SOAJ: INTRAMUSCULAR | 0 refills | Status: DC

## 2019-08-10 NOTE — Progress Notes (Signed)
Subjective:  Patient ID: Ann Sampson, female    DOB: [birth year]  Age: 44 y.o. MRN: [id number]  CC:  Chief Complaint  Patient presents with  . Follow-up    6 month follow up on pt's anxiety and inflamation of pts feet.. pt states no change since last visit. pt has not had a panic attack since last visit. pt scored a 13 on GAD-7  . Shoulder Pain    pt is having shoulder pain in both shoulders the pain is more som intence in the R shoulder. pt states she feels some inflamation in her shoulders. pt states this feels like a diffrent type of inflamation than she feels in her feet.    HPI Ann Sampson presents for   Generalized anxiety disorder: Currently on Lexapro 20 mg daily, improving at last visit in August at higher dosing.  Also improved with coping techniques. 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  Coping: reading, insight timer, meditation. Taking a lunch. No other resources requested at this time - doing ok.  No recent anxiety attack.   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  Continued on gabapentin 300 mg nightly. Still taking gabapentin - parasthesias in fingers better. Ache in hands at times. No swelling. Toes tingling at times. appeared that toes were red/swollen last weekend. In cold weather at times.  Swelling better.  appt with neuro in May.   Bilateral shoulder pain Past 4 weeks, no known injury, no new exercises/activity. Initially noticed with driving - reaching backward.  Both shoulders have felt inflamed past 6 weeks.  Shooting pain down R arm - shoulder to elbow with certain positions- reaching backward. R hand dominant. Rash with vioxx. Has taken alleve, ibuprofen without issues in past - none recently.  Prednisone caused anaphylaxis. No PUD.   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 No recent use -few years ago. Aware of use and ER/911 need.   History Patient Active Problem List   Diagnosis Date Noted  . Generalized anxiety disorder 11/03/2017  . Neuropathy 11/04/2016  . Paresthesias 11/04/2016   Past Medical History:  Diagnosis Date  . Allergy   . Arthritis   . Asthma   . Depression    Past Surgical History:  Procedure Laterality Date  . BREAST SURGERY    . KNEE SURGERY     Allergies  Allergen Reactions  . Bextra [Valdecoxib]   . Flagyl [Metronidazole]   . Prednisone     Anaphylaxis   . Vioxx [Rofecoxib]    Prior to Admission medications   Medication Sig Start Date End Date Taking? Authorizing Provider  EPINEPHrine 0.3 mg/0.3 mL IJ SOAJ injection USE AS DIRECTED 10/02/18  Yes Wendie Agreste, MD  escitalopram (LEXAPRO) 20 MG tablet Take 1 tablet (20 mg total) by mouth daily. 02/07/19  Yes Wendie Agreste, MD  Multiple Vitamins-Minerals (ADULT GUMMY PO) Take by mouth.   Yes [provider]  mupirocin nasal ointment (BACTROBAN NASAL) 2 % Place 1  application into the nose 2 (two) times daily. Use one-half of tube in each nostril twice daily for five (5) days. After application, press sides of nose together and gently massage. 02/07/19  Yes Shade Flood, MD  gabapentin (NEURONTIN) 300 MG capsule Take 1 capsule (300 mg total) by mouth at bedtime. TAKE 1 CAPSULE DAILY 11/09/18   Ihor Austin, NP   Social History   Socioeconomic History  . Marital status: Divorced    Spouse name: Not on file  . Number of children: Not on file  . Years of education: Not on file  . Highest education level: Not on file    Occupational History  . Not on file  Tobacco Use  . Smoking status: Former Games developer  . Smokeless tobacco: Never Used  Substance and Sexual Activity  . Alcohol use: Yes    Alcohol/week: 1.0 standard drinks    Types: 1 Glasses of wine per week    Comment: occcasionally  . Drug use: No  . Sexual activity: Not on file  Other Topics Concern  . Not on file  Social History Narrative  . Not on file   Social Determinants of Health   Financial Resource Strain:   . Difficulty of Paying Living Expenses: Not on file  Food Insecurity:   . Worried About Programme researcher, broadcasting/film/video in the Last Year: Not on file  . Ran Out of Food in the Last Year: Not on file  Transportation Needs:   . Lack of Transportation (Medical): Not on file  . Lack of Transportation (Non-Medical): Not on file  Physical Activity:   . Days of Exercise per Week: Not on file  . Minutes of Exercise per Session: Not on file  Stress:   . Feeling of Stress : Not on file  Social Connections:   . Frequency of Communication with Friends and Family: Not on file  . Frequency of Social Gatherings with Friends and Family: Not on file  . Attends Religious Services: Not on file  . Active Member of Clubs or Organizations: Not on file  . Attends Banker Meetings: Not on file  . Marital Status: Not on file  Intimate Partner Violence:   . Fear of Current or Ex-Partner: Not on file  . Emotionally Abused: Not on file  . Physically Abused: Not on file  . Sexually Abused: Not on file    Review of Systems   Objective:   Vitals:   08/10/19 1455  BP: (!) 148/87  Pulse: 97  Temp: 98.3 F (36.8 C)  TempSrc: Temporal  SpO2: 100%  Weight: 184 lb (83.5 kg)  Height: 5' 4.5" (1.638 m)     Physical Exam Constitutional:      General: She is not in acute distress.    Appearance: She is well-developed.  HENT:     Head: Normocephalic and atraumatic.  Cardiovascular:     Rate and Rhythm: Normal rate.  Pulmonary:      Effort: Pulmonary effort is normal.  Musculoskeletal:     Comments: C-spine, pain-free range of motion.  Some spasm of upper trapezius bilaterally.  No pain down the arms with C-spine range of motion.  Shoulders bilateral: AC, [place] nontender.  No focal bony tenderness.  Full range of motion, full rotator cuff strength.  Minimal discomfort into the right arm with Hawkins negative Neer's.  Skin:    Capillary Refill: Capillary refill takes less than 2 seconds. Slight erythematous appearance of distal toes, cool to touch, but  cap refill less than 1 second, DP pulse 1+.  No cyanosis. Neurological:     General: No focal deficit present.     Mental Status: She is alert and oriented to person, place, and time.  Psychiatric:        Mood and Affect: Mood normal.        Behavior: Behavior normal.        Thought Content: Thought content normal.        Assessment & Plan:  Ann Sampson is a 44 y.o. female . Generalized anxiety disorder  -Reports overall stable symptoms, option counseling or adjustment of meds but would like to stay where she is right now.  Continue Lexapro same dose   Past history of insect allergy - Plan: EPINEPHrine 0.3 mg/0.3 mL IJ SOAJ injection  -EpiPen refilled History of anaphylaxis - Plan: EPINEPHrine 0.3 mg/0.3 mL IJ SOAJ injection  Arthralgia of shoulder, unspecified laterality Muscle spasm  -Overall reassuring exam, potentially could have some radicular symptoms from trapezius spasm, paraspinal spasm of neck, also could be related to tension/stress.  Symptomatic care discussed with ibuprofen temporarily over-the-counter, heat, gentle range of motion, stretches.  Declined muscle relaxant at this time.  RTC precautions if not improving in the next few weeks.  Dysesthesia  -Overall stable hand symptoms, few dysesthesias in the feet, appears to be more with cold weather.  May have component of Raynaud's syndrome.  Prevention techniques discussed, hold on meds for now  unless persistent symptoms could consider calcium channel blocker.  RTC precautions.  Meds ordered this encounter  Medications  . EPINEPHrine 0.3 mg/0.3 mL IJ SOAJ injection    Sig: USE AS DIRECTED    Dispense:  2 each    Refill:  0   Patient Instructions   If any return of toe swelling/redness or it does not improve quickly - please return for follow up visit. Make sure to wear warm socks and shoes and cold.   No change in gabapentin or lexapro for now.   Ok to try alleve or ibuprofen temporarily, heat and gentle range of motion of neck and shoulders for now. Recheck in next 2 weeks if not improving.   If you have lab work done today you will be contacted with your lab results within the next 2 weeks.  If you have not heard from [place] then please contact us. The fastest way to get your results is to register for My Chart.   IF you received an x-ray today, you will receive an invoice from [facility]. Please contact [facility] at [phone] with questions or concerns regarding your invoice.   IF you received labwork today, you will receive an invoice from [place]. Please contact [facility] at [phone] with questions or concerns regarding your invoice.   Our billing staff will not be able to assist you with questions regarding bills from these companies.  You will be contacted with the lab results as soon as they are available. The fastest way to get your results is to activate your My Chart account. Instructions are located on the last page of this paperwork. If you have not heard from [place] regarding the results in 2 weeks, please contact this office.         Signed, [name], MD [facility]

## 2019-08-10 NOTE — Patient Instructions (Addendum)
If any return of toe swelling/redness or it does not improve quickly - please return for follow up visit. Make sure to wear warm socks and shoes and cold.   No change in gabapentin or lexapro for now.   Ok to try alleve or ibuprofen temporarily, heat and gentle range of motion of neck and shoulders for now. Recheck in next 2 weeks if not improving.   If you have lab work done today you will be contacted with your lab results within the next 2 weeks.  If you have not heard from [place] then please contact us. The fastest way to get your results is to register for My Chart.   IF you received an x-ray today, you will receive an invoice from [facility]. Please contact [facility] at [phone] with questions or concerns regarding your invoice.   IF you received labwork today, you will receive an invoice from [place]. Please contact [facility] at [phone] with questions or concerns regarding your invoice.   Our billing staff will not be able to assist you with questions regarding bills from these companies.  You will be contacted with the lab results as soon as they are available. The fastest way to get your results is to activate your My Chart account. Instructions are located on the last page of this paperwork. If you have not heard from [place] regarding the results in 2 weeks, please contact this office.

## 2019-11-30 ENCOUNTER — Telehealth: Payer: Self-pay | Admitting: Adult Health

## 2019-11-30 NOTE — Telephone Encounter (Signed)
Pt gave consent for insurance to be filed for vv  Pt understands that although there may be some limitations with this type of visit, we will take all precautions to reduce any security or privacy concerns.  Pt understands that this will be treated like an in office visit and we will file with pt's insurance, and there may be a patient responsible charge related to this service.  

## 2019-12-01 ENCOUNTER — Encounter: Payer: Self-pay | Admitting: Adult Health

## 2019-12-01 ENCOUNTER — Telehealth (INDEPENDENT_AMBULATORY_CARE_PROVIDER_SITE_OTHER): Payer: 59 | Admitting: Adult Health

## 2019-12-01 DIAGNOSIS — G629 Polyneuropathy, unspecified: Secondary | ICD-10-CM | POA: Diagnosis not present

## 2019-12-01 DIAGNOSIS — R202 Paresthesia of skin: Secondary | ICD-10-CM | POA: Diagnosis not present

## 2019-12-01 DIAGNOSIS — R200 Anesthesia of skin: Secondary | ICD-10-CM | POA: Diagnosis not present

## 2019-12-01 MED ORDER — GABAPENTIN 300 MG PO CAPS: 300.0000 mg | ORAL_CAPSULE | Freq: Every day | ORAL | 3 refills | Status: DC

## 2019-12-01 NOTE — Progress Notes (Signed)
[facility]  PATIENT: Ann Sampson DOB: [birth year]   REASON FOR VISIT: Follow-up for paresthesias HISTORY FROM: Patient  Virtual Visit via Video Note  I connected with Ann Sampson on 12/01/19 at  3:45 PM EDT by a video enabled telemedicine application located at [facility] and verified that I am speaking with the correct person using two identifiers who was located at their own home.   Visit scheduled by Braxton Feathers, administrative assistant, who discussed the limitations of evaluation and management by telemedicine and the availability of in person appointments. The patient expressed understanding and agreed to proceed.   HISTORY OF PRESENT ILLNESS  Ann Sampson is a 44 y.o. female who continues to be followed in this office for bilateral paresthesias located feet and fingertips which has been stable with use of gabapentin.  Today, 12/01/2019, [name] is being seen via virtual visit for 1 year follow-up. She states she has been doing well with symptoms stable. Reports ongoing use of gabapentin 300mg  nightly tolerating well. She states she will experience skin color changes in her fingers and toes with colder weather which has been present "for years". At times when her toes are more numb, she will assess her skin and typically blue in color and will resolve after warming her skin.  She will experience increased numbness/tingling at that time.  Recently seen by PCP with questionable Raynaud's syndrome.  No concerns at this time.       REVIEW OF SYSTEMS: Full 14 system review of systems performed and notable only for those listed, all others are neg:  Numbness/tingling, nerve pain   ALLERGIES: Allergies  Allergen Reactions   Bextra [Valdecoxib]    Flagyl [Metronidazole]    Prednisone     Anaphylaxis    Vioxx [Rofecoxib]     HOME MEDICATIONS: Outpatient Medications Prior to Visit  Medication Sig Dispense Refill    EPINEPHrine 0.3 mg/0.3 mL IJ SOAJ injection USE AS DIRECTED 2 each 0   escitalopram (LEXAPRO) 20 MG tablet Take 1 tablet (20 mg total) by mouth daily. 90 tablet 1   gabapentin (NEURONTIN) 300 MG capsule Take 1 capsule (300 mg total) by mouth at bedtime. TAKE 1 CAPSULE DAILY 30 capsule 11   Multiple Vitamins-Minerals (ADULT GUMMY PO) Take by mouth.     mupirocin nasal ointment (BACTROBAN NASAL) 2 % Place 1 application into the nose 2 (two) times daily. Use one-half of tube in each nostril twice daily for five (5) days. After application, press sides of nose together and gently massage. 10 g 2   No facility-administered medications prior to visit.    PAST MEDICAL HISTORY: Past Medical History:  Diagnosis Date   Allergy    Arthritis    Asthma    Depression     PAST SURGICAL HISTORY: Past Surgical History:  Procedure Laterality Date   BREAST SURGERY     KNEE SURGERY      FAMILY HISTORY: Family History  Problem Relation Age of Onset   Skin cancer Mother    Asthma Son    Seizures Maternal Grandmother    Cancer Maternal Grandmother    Stroke Maternal Grandmother    Neuropathy Maternal Grandfather    Diabetic kidney disease Maternal Grandfather    Diabetes Maternal Grandfather    Heart disease Paternal Grandfather    Hyperlipidemia Paternal Grandfather    Hypertension Paternal Grandfather     SOCIAL HISTORY: Social History   Socioeconomic History   Marital status: Divorced  Spouse name: Not on file   Number of children: Not on file   Years of education: Not on file   Highest education level: Not on file  Occupational History   Not on file  Tobacco Use   Smoking status: Former Smoker   Smokeless tobacco: Never Used  Substance and Sexual Activity   Alcohol use: Yes    Alcohol/week: 1.0 standard drinks    Types: 1 Glasses of wine per week    Comment: occcasionally   Drug use: No   Sexual activity: Not on file  Other Topics Concern     Not on file  Social History Narrative   Not on file   Social Determinants of Health   Financial Resource Strain:    Difficulty of Paying Living Expenses:   Food Insecurity:    Worried About Programme researcher, broadcasting/film/video in the Last Year:    Barista in the Last Year:   Transportation Needs:    Freight forwarder (Medical):    Lack of Transportation (Non-Medical):   Physical Activity:    Days of Exercise per Week:    Minutes of Exercise per Session:   Stress:    Feeling of Stress :   Social Connections:    Frequency of Communication with Friends and Family:    Frequency of Social Gatherings with Friends and Family:    Attends Religious Services:    Active Member of Clubs or Organizations:    Attends Engineer, structural:    Marital Status:   Intimate Partner Violence:    Fear of Current or Ex-Partner:    Emotionally Abused:    Physically Abused:    Sexually Abused:      PHYSICAL EXAM  Generalized: Well developed, pleasant middle-aged female, in no acute distress  Head: normocephalic and atraumatic,. Oropharynx benign   Neurological examination   Mentation: Alert oriented to time, place, history taking. Attention span and concentration appropriate. Recent and remote memory intact.  Follows all commands speech and language fluent.   Cranial nerve II-XII: extraocular movements were full,  Facial sensation intact.  Hearing intact to voice.  Uvula tongue midline.  Shoulder shrug symmetric. Motor: No evidence of weakness per direct assessment Sensory: Intact to light touch with prior assessment finding decreased vibratory sensation Coordination: finger-nose-finger, heel-to-shin bilaterally, no dysmetria Reflexes: UTA Gait and Station: Rising up from seated position without assistance, normal stance, normal gait.  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    Questionable underlying Raynaud's syndrome as she does have skin color changes in her toes and fingers with cold weather associated with increased numbness/tingling.  EMG nerve conduction was normal without evidence of neuropathy or radiculopathy. Lab work to check for treatable causes are unremarkable.    Continue Gabapentin 300mg  at night for ongoing benefit of paresthesias -refill placed Advised to avoid extreme changes in temperature for possible Raynaud's syndrome.  No indication at this time to initiate medical management.  Advised to continue to follow with PCP for ongoing monitoring and possible need of management in the future Advised to notify office if neuropathy/paresthesias worsen  Follow up in 1 year or call earlier if needed -patient states she will call back to schedule follow-up visit  I spent 20 minutes of face-to-face and non-face-to-face time with patient.  This included previsit chart review, lab review, study  review, order entry, electronic health record documentation, patient education   Ihor Austin, [facility]  [facility] [address] [place], [place] [address]  Phone [phone] Fax [phone] Note: This document was prepared with digital dictation and possible smart phrase technology. Any transcriptional errors that result from this process are unintentional.

## 2019-12-02 NOTE — Progress Notes (Signed)
I agree with the above plan 

## 2020-01-30 ENCOUNTER — Encounter: Payer: Self-pay | Admitting: Emergency Medicine

## 2020-01-30 ENCOUNTER — Other Ambulatory Visit: Payer: Self-pay

## 2020-01-30 ENCOUNTER — Ambulatory Visit: Payer: 59 | Admitting: Emergency Medicine

## 2020-01-30 VITALS — BP 133/79 | HR 80 | Temp 98.7°F | Ht 64.0 in | Wt 194.6 lb

## 2020-01-30 DIAGNOSIS — T7840XA Allergy, unspecified, initial encounter: Secondary | ICD-10-CM | POA: Diagnosis not present

## 2020-01-30 DIAGNOSIS — R220 Localized swelling, mass and lump, head: Secondary | ICD-10-CM

## 2020-01-30 MED ORDER — FAMOTIDINE 40 MG PO TABS: 40.0000 mg | ORAL_TABLET | Freq: Every day | ORAL | 0 refills | Status: DC

## 2020-01-30 MED ORDER — CETIRIZINE HCL 10 MG PO TABS: 10.0000 mg | ORAL_TABLET | Freq: Every day | ORAL | 0 refills | Status: DC

## 2020-01-30 NOTE — Patient Instructions (Addendum)
   If you have lab work done today you will be contacted with your lab results within the next 2 weeks.  If you have not heard from us then please contact us. The fastest way to get your results is to register for My Chart.   IF you received an x-ray today, you will receive an invoice from [facility]. Please contact [facility] at [phone] with questions or concerns regarding your invoice.   IF you received labwork today, you will receive an invoice from [facility]. Please contact [facility] at [phone] with questions or concerns regarding your invoice.   Our billing staff will not be able to assist you with questions regarding bills from these companies.  You will be contacted with the lab results as soon as they are available. The fastest way to get your results is to activate your My Chart account. Instructions are located on the last page of this paperwork. If you have not heard from us regarding the results in 2 weeks, please contact this office.     Allergies, Adult An allergy means that your body reacts to something that bothers it (allergen). It is not a normal reaction. This can happen from something that you:  Eat.  Breathe in.  Touch. You can have an allergy (be allergic) to:  Outdoor things, like: ? Pollen. ? Grass. ? Weeds.  Indoor things, like: ? Dust. ? Smoke. ? Pet dander.  Foods.  Medicines.  Things that bother your skin, like: ? Detergents. ? Chemicals. ? Latex.  Perfume.  Bugs. An allergy cannot spread from person to person (is not contagious). Follow these instructions at home:         Stay away from things that you know you are allergic to.  If you have allergies to things in the air, wash out your nose each day. Do it with one of these: ? A salt-water (saline) spray. ? A container (neti pot).  Take over-the-counter and prescription medicines only as told by your doctor.  Keep all follow-up visits as told by  your doctor. This is important.  If you are at risk for a very bad allergy reaction (anaphylaxis), keep an auto-injector with you all the time. This is called an epinephrine injection. ? This is pre-measured medicine with a needle. You can put it into your skin by yourself. ? Right after you have a very bad allergy reaction, you or a person with you must give the medicine in less than a few minutes. This is an emergency.  If you have ever had a very bad allergy reaction, wear a medical alert bracelet or necklace. Your very bad allergy should be written on it. Contact a health care provider if:  Your symptoms do not get better with treatment. Get help right away if:  You have symptoms of a very bad allergy reaction. These include: ? A swollen mouth, tongue, or throat. ? Pain or tightness in your chest. ? Trouble breathing. ? Being short of breath. ? Dizziness. ? Fainting. ? Very bad pain in your belly (abdomen). ? Throwing up (vomiting). ? Watery poop (diarrhea). Summary  An allergy means that your body reacts to something that bothers it (allergen). It is not a normal reaction.  Stay away from things that make your body react.  Take over-the-counter and prescription medicines only as told by your doctor.  If you are at risk for a very bad allergy reaction, carry an auto-injector (epinephrine injection) all the time. Also, wear   wear a medical alert bracelet or necklace so people know about your allergy. This information is not intended to replace advice given to you by your health care provider. Make sure you discuss any questions you have with your health care provider. Document Revised: 10/05/2018 Document Reviewed: 09/29/2016 Elsevier Patient Education  2020 [employer].

## 2020-01-30 NOTE — Progress Notes (Signed)
Ann Sampson 44 y.o.   Chief Complaint  Patient presents with  . Oral Swelling    started last night this am was worse (cookies and cream cupcake was the only new thing she had last night)    HISTORY OF PRESENT ILLNESS: This is a 44 y.o. female developed lip swelling last night, worse this morning.  Unsure about what triggered it.  Denies systemic symptoms.  Has severe allergy to prednisone.  Took Benadryl last night. No other complaints or medical concerns today.  HPI   Prior to Admission medications   Medication Sig Start Date End Date Taking? Authorizing Provider  EPINEPHrine 0.3 mg/0.3 mL IJ SOAJ injection USE AS DIRECTED 08/10/19  Yes Ann Flood, MD  escitalopram (LEXAPRO) 20 MG tablet Take 1 tablet (20 mg total) by mouth daily. 08/10/19  Yes Ann Flood, MD  gabapentin (NEURONTIN) 300 MG capsule Take 1 capsule (300 mg total) by mouth at bedtime. 12/01/19  Yes McCue, Shanda Bumps, NP  Multiple Vitamins-Minerals (ADULT GUMMY PO) Take by mouth.   Yes [provider]  mupirocin nasal ointment (BACTROBAN NASAL) 2 % Place 1 application into the nose 2 (two) times daily. Use one-half of tube in each nostril twice daily for five (5) days. After application, press sides of nose together and gently massage. 02/07/19  Yes Ann Flood, MD    Allergies  Allergen Reactions  . Bextra [Valdecoxib]   . Flagyl [Metronidazole]   . Prednisone     Anaphylaxis   . Vioxx [Rofecoxib]     Patient Active Problem List   Diagnosis Date Noted  . Generalized anxiety disorder 11/03/2017  . Neuropathy 11/04/2016  . Paresthesias 11/04/2016    Past Medical History:  Diagnosis Date  . Allergy   . Arthritis   . Asthma   . Depression     Past Surgical History:  Procedure Laterality Date  . BREAST SURGERY    . KNEE SURGERY      Social History   Socioeconomic History  . Marital status: Divorced    Spouse name: Not on file  . Number of children: Not on file  .  Years of education: Not on file  . Highest education level: Not on file  Occupational History  . Not on file  Tobacco Use  . Smoking status: Former Games developer  . Smokeless tobacco: Never Used  Substance and Sexual Activity  . Alcohol use: Yes    Alcohol/week: 1.0 standard drink    Types: 1 Glasses of wine per week    Comment: occcasionally  . Drug use: No  . Sexual activity: Not on file  Other Topics Concern  . Not on file  Social History Narrative  . Not on file   Social Determinants of Health   Financial Resource Strain:   . Difficulty of Paying Living Expenses:   Food Insecurity:   . Worried About Programme researcher, broadcasting/film/video in the Last Year:   . Barista in the Last Year:   Transportation Needs:   . Freight forwarder (Medical):   Marland Kitchen Lack of Transportation (Non-Medical):   Physical Activity:   . Days of Exercise per Week:   . Minutes of Exercise per Session:   Stress:   . Feeling of Stress :   Social Connections:   . Frequency of Communication with Friends and Family:   . Frequency of Social Gatherings with Friends and Family:   . Attends Religious Services:   . Active Member  of Clubs or Organizations:   . Attends Banker Meetings:   Marland Kitchen Marital Status:   Intimate Partner Violence:   . Fear of Current or Ex-Partner:   . Emotionally Abused:   Marland Kitchen Physically Abused:   . Sexually Abused:     Family History  Problem Relation Age of Onset  . Skin cancer Mother   . Asthma Son   . Seizures Maternal Grandmother   . Cancer Maternal Grandmother   . Stroke Maternal Grandmother   . Neuropathy Maternal Grandfather   . Diabetic kidney disease Maternal Grandfather   . Diabetes Maternal Grandfather   . Heart disease Paternal Grandfather   . Hyperlipidemia Paternal Grandfather   . Hypertension Paternal Grandfather      Review of Systems  Constitutional: Negative.  Negative for chills and fever.  HENT: Negative.  Negative for congestion, sinus pain and sore  throat.   Respiratory: Negative.  Negative for cough and shortness of breath.   Cardiovascular: Negative.  Negative for chest pain and palpitations.  Gastrointestinal: Negative for abdominal pain, nausea and vomiting.  Genitourinary: Negative.  Negative for dysuria.  Musculoskeletal: Negative for joint pain and myalgias.  Skin: Negative.  Negative for rash.  Neurological: Negative for headaches.  All other systems reviewed and are negative.  Today's Vitals   01/30/20 0916  BP: (!) 133/79  Pulse: 80  Temp: 98.7 F (37.1 C)  TempSrc: Temporal  SpO2: 99%  Weight: 194 lb 9.6 oz (88.3 kg)  Height: 5\' 4"  (1.626 m)   Body mass index is 33.4 kg/m.   Physical Exam Vitals reviewed.  Constitutional:      Appearance: Normal appearance.  HENT:     Head: Normocephalic.     Mouth/Throat:     Mouth: Mucous membranes are moist.     Pharynx: Oropharynx is clear.     Comments: Mild diffuse swelling of both lips with minor cracking Eyes:     Extraocular Movements: Extraocular movements intact.     Conjunctiva/sclera: Conjunctivae normal.     Pupils: Pupils are equal, round, and reactive to light.  Cardiovascular:     Rate and Rhythm: Normal rate.  Pulmonary:     Effort: Pulmonary effort is normal.  Musculoskeletal:        General: Normal range of motion.     Cervical back: No tenderness.  Lymphadenopathy:     Cervical: No cervical adenopathy.  Skin:    General: Skin is warm and dry.     Findings: No rash.  Neurological:     General: No focal deficit present.     Mental Status: She is alert.  Psychiatric:        Mood and Affect: Mood normal.        Behavior: Behavior normal.      ASSESSMENT & PLAN: Ann Sampson was seen today for oral swelling.  Diagnoses and all orders for this visit:  Lip swelling  Acute allergic reaction, initial encounter -     famotidine (PEPCID) 40 MG tablet; Take 1 tablet (40 mg total) by mouth daily for 5 days. -     cetirizine (ZYRTEC) 10 MG  tablet; Take 1 tablet (10 mg total) by mouth daily for 5 days.    Patient Instructions       If you have lab work done today you will be contacted with your lab results within the next 2 weeks.  If you have not heard from Ann Sampson then please contact us. The fastest way to get  your results is to register for My Chart.   IF you received an x-ray today, you will receive an invoice from [facility]. Please contact [facility] at [phone] with questions or concerns regarding your invoice.   IF you received labwork today, you will receive an invoice from [facility]. Please contact [facility] at [phone] with questions or concerns regarding your invoice.   Our billing staff will not be able to assist you with questions regarding bills from these companies.  You will be contacted with the lab results as soon as they are available. The fastest way to get your results is to activate your My Chart account. Instructions are located on the last page of this paperwork. If you have not heard from [place] regarding the results in 2 weeks, please contact this office.      Allergies, Adult An allergy means that your body reacts to something that bothers it (allergen). It is not a normal reaction. This can happen from something that you:  Eat.  Breathe in.  Touch. You can have an allergy (be allergic) to:  Outdoor things, like: ? Pollen. ? Grass. ? Weeds.  Indoor things, like: ? Dust. ? Smoke. ? Pet dander.  Foods.  Medicines.  Things that bother your skin, like: ? Detergents. ? Chemicals. ? Latex.  Perfume.  Bugs. An allergy cannot spread from person to person (is not contagious). Follow these instructions at home:         Stay away from things that you know you are allergic to.  If you have allergies to things in the air, wash out your nose each day. Do it with one of these: ? A salt-water (saline) spray. ? A container (neti pot).  Take  over-the-counter and prescription medicines only as told by your doctor.  Keep all follow-up visits as told by your doctor. This is important.  If you are at risk for a very bad allergy reaction (anaphylaxis), keep an auto-injector with you all the time. This is called an epinephrine injection. ? This is pre-measured medicine with a needle. You can put it into your skin by yourself. ? Right after you have a very bad allergy reaction, you or a person with you must give the medicine in less than a few minutes. This is an emergency.  If you have ever had a very bad allergy reaction, wear a medical alert bracelet or necklace. Your very bad allergy should be written on it. Contact a health care provider if:  Your symptoms do not get better with treatment. Get help right away if:  You have symptoms of a very bad allergy reaction. These include: ? A swollen mouth, tongue, or throat. ? Pain or tightness in your chest. ? Trouble breathing. ? Being short of breath. ? Dizziness. ? Fainting. ? Very bad pain in your belly (abdomen). ? Throwing up (vomiting). ? Watery poop (diarrhea). Summary  An allergy means that your body reacts to something that bothers it (allergen). It is not a normal reaction.  Stay away from things that make your body react.  Take over-the-counter and prescription medicines only as told by your doctor.  If you are at risk for a very bad allergy reaction, carry an auto-injector (epinephrine injection) all the time. Also, wear a medical alert bracelet or necklace so people know about your allergy. This information is not intended to replace advice given to you by your health care provider. Make sure you discuss any questions you have with your health  care provider. Document Revised: 10/05/2018 Document Reviewed: 09/29/2016 Elsevier Patient Education  2020 [employer] Inc[employer]      Edwina BarthMiguel Kyran Connaughton, MD [facility]

## 2020-02-08 ENCOUNTER — Ambulatory Visit: Payer: 59 | Admitting: Family Medicine

## 2020-02-15 ENCOUNTER — Encounter: Payer: Self-pay | Admitting: Family Medicine

## 2020-02-15 ENCOUNTER — Other Ambulatory Visit: Payer: Self-pay

## 2020-02-15 ENCOUNTER — Ambulatory Visit: Payer: 59 | Admitting: Family Medicine

## 2020-02-15 VITALS — BP 131/88 | HR 78 | Temp 98.3°F | Ht 64.0 in | Wt 195.0 lb

## 2020-02-15 DIAGNOSIS — F411 Generalized anxiety disorder: Secondary | ICD-10-CM

## 2020-02-15 DIAGNOSIS — M7581 Other shoulder lesions, right shoulder: Secondary | ICD-10-CM | POA: Diagnosis not present

## 2020-02-15 DIAGNOSIS — M25511 Pain in right shoulder: Secondary | ICD-10-CM

## 2020-02-15 MED ORDER — ESCITALOPRAM OXALATE 20 MG PO TABS: 20.0000 mg | ORAL_TABLET | Freq: Every day | ORAL | 1 refills | Status: DC
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
## 2020-02-15 NOTE — Patient Instructions (Addendum)
Remain on same dose Lexapro for now. Let me know if you need number to meet with therapist. If you decide to meet with therapist, I would recommend discussing your concerns with prior therapy and current goals.   I will refer you to physical therapy for your shoulder.  Okay to continue gentle range of motion and stretching as needed during the day and try to avoid sleeping on that shoulder.  As we discussed there could be a component of rotator cuff tendinitis or tendinosis along with some mild frozen shoulder but your overall range of motion seems to be improving.  Let me know if there are questions, recheck 6 weeks.  Can perform some fasting labs at that time.    If you have lab work done today you will be contacted with your lab results within the next 2 weeks.  If you have not heard from [place] then please contact us. The fastest way to get your results is to register for My Chart.   IF you received an x-ray today, you will receive an invoice from [facility]. Please contact [facility] at [phone] with questions or concerns regarding your invoice.   IF you received labwork today, you will receive an invoice from [place] [facility]. Please contact [facility] at [phone] with questions or concerns regarding your invoice.   Our billing staff will not be able to assist you with questions regarding bills from these companies.  You will be contacted with the lab results as soon as they are available. The fastest way to get your results is to activate your My Chart account. Instructions are located on the last page of this paperwork. If you have not heard from [place] regarding the results in 2 weeks, please contact this office.

## 2020-02-16 ENCOUNTER — Encounter: Payer: Self-pay | Admitting: Family Medicine

## 2020-03-07 ENCOUNTER — Ambulatory Visit: Payer: 59 | Admitting: Physical Therapy

## 2020-03-13 ENCOUNTER — Other Ambulatory Visit: Payer: Self-pay

## 2020-03-13 DIAGNOSIS — Y92009 Unspecified place in unspecified non-institutional (private) residence as the place of occurrence of the external cause: Secondary | ICD-10-CM | POA: Insufficient documentation

## 2020-03-13 DIAGNOSIS — J45909 Unspecified asthma, uncomplicated: Secondary | ICD-10-CM | POA: Insufficient documentation

## 2020-03-13 DIAGNOSIS — Z87891 Personal history of nicotine dependence: Secondary | ICD-10-CM | POA: Insufficient documentation

## 2020-03-13 DIAGNOSIS — W010XXA Fall on same level from slipping, tripping and stumbling without subsequent striking against object, initial encounter: Secondary | ICD-10-CM | POA: Diagnosis not present

## 2020-03-13 DIAGNOSIS — S99922A Unspecified injury of left foot, initial encounter: Secondary | ICD-10-CM | POA: Diagnosis present

## 2020-03-13 DIAGNOSIS — S93402A Sprain of unspecified ligament of left ankle, initial encounter: Secondary | ICD-10-CM | POA: Insufficient documentation

## 2020-03-13 DIAGNOSIS — Y999 Unspecified external cause status: Secondary | ICD-10-CM | POA: Diagnosis not present

## 2020-03-13 DIAGNOSIS — Y9301 Activity, walking, marching and hiking: Secondary | ICD-10-CM | POA: Insufficient documentation

## 2020-03-14 ENCOUNTER — Encounter (HOSPITAL_COMMUNITY): Payer: Self-pay | Admitting: Emergency Medicine

## 2020-03-14 ENCOUNTER — Emergency Department (HOSPITAL_COMMUNITY)
Admission: EM | Admit: 2020-03-14 | Discharge: 2020-03-14 | Disposition: A | Discharge status: Home / Self Care | Payer: 59 | Attending: Emergency Medicine | Admitting: Emergency Medicine

## 2020-03-14 ENCOUNTER — Emergency Department (HOSPITAL_COMMUNITY): Payer: 59

## 2020-03-14 DIAGNOSIS — S93402A Sprain of unspecified ligament of left ankle, initial encounter: Secondary | ICD-10-CM

## 2020-03-14 DIAGNOSIS — W19XXXA Unspecified fall, initial encounter: Secondary | ICD-10-CM

## 2020-03-14 MED ORDER — NAPROXEN 500 MG PO TABS: 500.0000 mg | ORAL_TABLET | Freq: Two times a day (BID) | ORAL | 0 refills | Status: DC

## 2020-03-14 NOTE — ED Triage Notes (Signed)
Patient tripped over kids toy at home. Patient has a swollen left ankle and swollen foot. Patient has no other complaints.

## 2020-03-14 NOTE — ED Provider Notes (Signed)
[facility]-EMERGENCY DEPT Provider Note   CSN: [id number] Arrival date & time: 03/13/20  2131     History Chief Complaint  Patient presents with  . Foot Pain    Ann Sampson is a 44 y.o. female.  HPI     This 44 year old female who presents with left foot injury.  Patient reports that she was walking looking at her phone when she tripped over her son's stuff on the floor.  She reports pain in the left ankle.  She states when she is not moving the pain is minimal.  However, walking and moving increase the pain.  She took 2 ibuprofen prior to arrival with minimal relief.  Has noted pain over the lateral aspect of the foot.  Denies knee pain.  Denies hitting her head or loss of consciousness.  Past Medical History:  Diagnosis Date  . Allergy   . Arthritis   . Asthma   . Depression     Patient Active Problem List   Diagnosis Date Noted  . Generalized anxiety disorder 11/03/2017  . Neuropathy 11/04/2016  . Paresthesias 11/04/2016    Past Surgical History:  Procedure Laterality Date  . BREAST SURGERY    . KNEE SURGERY       OB History   No obstetric history on file.     Family History  Problem Relation Age of Onset  . Skin cancer Mother   . Asthma Son   . Seizures Maternal Grandmother   . Cancer Maternal Grandmother   . Stroke Maternal Grandmother   . Neuropathy Maternal Grandfather   . Diabetic kidney disease Maternal Grandfather   . Diabetes Maternal Grandfather   . Heart disease Paternal Grandfather   . Hyperlipidemia Paternal Grandfather   . Hypertension Paternal Grandfather     Social History   Tobacco Use  . Smoking status: Former Games developer  . Smokeless tobacco: Never Used  Substance Use Topics  . Alcohol use: Yes    Alcohol/week: 1.0 standard drink    Types: 1 Glasses of wine per week    Comment: occcasionally  . Drug use: No    Home Medications Prior to Admission medications   Medication Sig Start Date End Date  Taking? Authorizing Provider  cetirizine (ZYRTEC) 10 MG tablet Take 1 tablet (10 mg total) by mouth daily for 5 days. 01/30/20 02/04/20  Georgina Quint, MD  EPINEPHrine 0.3 mg/0.3 mL IJ SOAJ injection USE AS DIRECTED 08/10/19   Shade Flood, MD  escitalopram (LEXAPRO) 20 MG tablet Take 1 tablet (20 mg total) by mouth daily. 02/15/20   Shade Flood, MD  famotidine (PEPCID) 40 MG tablet Take 1 tablet (40 mg total) by mouth daily for 5 days. 01/30/20 02/04/20  Georgina Quint, MD  gabapentin (NEURONTIN) 300 MG capsule Take 1 capsule (300 mg total) by mouth at bedtime. 12/01/19   Ihor Austin, NP  Multiple Vitamins-Minerals (ADULT GUMMY PO) Take by mouth.    [provider]  mupirocin nasal ointment (BACTROBAN NASAL) 2 % Place 1 application into the nose 2 (two) times daily. Use one-half of tube in each nostril twice daily for five (5) days. After application, press sides of nose together and gently massage. 02/07/19   Shade Flood, MD  naproxen (NAPROSYN) 500 MG tablet Take 1 tablet (500 mg total) by mouth 2 (two) times daily. 03/14/20   Keyron Pokorski, Mayer Masker, MD    Allergies    Bextra [valdecoxib], Flagyl [metronidazole], Prednisone, and Vioxx [rofecoxib]  Review of Systems   Review of Systems  Constitutional: Negative for fever.  Respiratory: Negative for shortness of breath.   Cardiovascular: Negative for chest pain.  Gastrointestinal: Negative for abdominal pain, nausea and vomiting.  Musculoskeletal:       Left foot and ankle pain  Neurological: Negative for syncope.  All other systems reviewed and are negative.   Physical Exam Updated Vital Signs BP (!) 147/94 (BP Location: Right Arm)   Pulse (!) 109   Temp 99.3 F (37.4 C) (Oral)   Resp 16   Ht 1.651 m (5\' 5" )   Wt 88.5 kg   LMP 02/26/2020   SpO2 96%   BMI 32.45 kg/m   Physical Exam Vitals and nursing note reviewed.  Constitutional:      Appearance: She is well-developed. She is not ill-appearing.    HENT:     Head: Normocephalic and atraumatic.     Nose: Nose normal.     Mouth/Throat:     Mouth: Mucous membranes are moist.  Eyes:     Pupils: Pupils are equal, round, and reactive to light.  Cardiovascular:     Rate and Rhythm: Normal rate and regular rhythm.  Pulmonary:     Effort: Pulmonary effort is normal. No respiratory distress.  Musculoskeletal:     Cervical back: Neck supple.     Comments: Focused examination of the left lower extremity with tenderness palpation over the lateral malleolus, slight swelling noted, normal range of motion of the ankle, 2+ DP pulse, neurovascularly intact distally, no proximal fibular tenderness  Skin:    General: Skin is warm and dry.  Neurological:     Mental Status: She is alert and oriented to person, place, and time.  Psychiatric:        Mood and Affect: Mood normal.     ED Results / Procedures / Treatments   Labs (all labs ordered are listed, but only abnormal results are displayed) Labs Reviewed - No data to display  EKG None  Radiology DG Ankle Complete Left  Result Date: 03/14/2020 CLINICAL DATA:  Status post trauma. EXAM: LEFT ANKLE COMPLETE - 3+ VIEW COMPARISON:  None. FINDINGS: There is no evidence of fracture, dislocation, or joint effusion. There is no evidence of arthropathy or other focal bone abnormality. Soft tissues are unremarkable. IMPRESSION: Negative. Electronically Signed   By: 03/16/2020 M.D.   On: 03/14/2020 01:51   DG Foot Complete Left  Result Date: 03/14/2020 CLINICAL DATA:  Status post trauma. EXAM: LEFT FOOT - COMPLETE 3+ VIEW COMPARISON:  None. FINDINGS: There is no evidence of fracture or dislocation. There is no evidence of arthropathy or other focal bone abnormality. Mild soft tissue swelling is seen along the dorsal aspect of the distal left foot. IMPRESSION: No acute osseous abnormality. Electronically Signed   By: 03/16/2020 M.D.   On: 03/14/2020 01:51    Procedures Procedures  (including critical care time)  Medications Ordered in ED Medications - No data to display  ED Course  I have reviewed the triage vital signs and the nursing notes.  Pertinent labs & imaging results that were available during my care of the patient were reviewed by me and considered in my medical decision making (see chart for details).    MDM Rules/Calculators/A&P                           Patient presents with left foot injury.  Overall nontoxic vital signs  are reassuring.  She has some swelling of the ankle and tenderness to palpation.  Suspect sprain.  X-rays obtained to rule out fracture.  X-rays independently reviewed by myself and show no signs of fracture.  Confirmed by radiology read.  Recommend rest, ice, compression, and elevation.  Patient stated understanding with treatment plan.  After history, exam, and medical workup I feel the patient has been appropriately medically screened and is safe for discharge home. Pertinent diagnoses were discussed with the patient. Patient was given return precautions.   Final Clinical Impression(s) / ED Diagnoses Final diagnoses:  Fall  Sprain of left ankle, unspecified ligament, initial encounter    Rx / DC Orders ED Discharge Orders         Ordered    naproxen (NAPROSYN) 500 MG tablet  2 times daily        03/14/20 0209           Shon Baton, MD 03/14/20 [phone]

## 2020-03-14 NOTE — Discharge Instructions (Addendum)
You were seen today for ankle and foot pain.  Your x-rays are negative for acute fracture.  You likely sustained a sprain.  Keep iced and elevated.  Ambulate as tolerated.  Make sure that you are wearing supportive shoes.  Take naproxen as needed for pain.

## 2020-03-15 ENCOUNTER — Encounter: Payer: Self-pay | Admitting: Family Medicine

## 2020-03-16 ENCOUNTER — Other Ambulatory Visit: Payer: Self-pay

## 2020-03-16 ENCOUNTER — Ambulatory Visit: Payer: 59 | Attending: Family Medicine

## 2020-03-16 DIAGNOSIS — M25611 Stiffness of right shoulder, not elsewhere classified: Secondary | ICD-10-CM | POA: Insufficient documentation

## 2020-03-16 DIAGNOSIS — M7501 Adhesive capsulitis of right shoulder: Secondary | ICD-10-CM | POA: Diagnosis present

## 2020-03-16 DIAGNOSIS — M25511 Pain in right shoulder: Secondary | ICD-10-CM | POA: Diagnosis present

## 2020-03-18 NOTE — Therapy (Signed)
[facility] Outpatient Rehabilitation [facility] [address] [place], [place], [address] Phone: [phone]   Fax:  [phone]  Physical Therapy Evaluation  Patient Details  Name: Ann Sampson MRN: [id number] Date of Birth: Nov 20, 1975 Referring Provider (PT): Shade Flood, MD   Encounter Date: 03/16/2020   PT End of Session - 03/18/20 2315    Visit Number 1    Number of Visits 5    Date for PT Re-Evaluation 04/13/20    Authorization Type [employer] OTHER    PT Start Time 1215    PT Stop Time 1308    PT Time Calculation (min) 53 min    Equipment Utilized During Treatment Other (comment)   NWB scooter due to L ankle sprain   Activity Tolerance Patient tolerated treatment well    Behavior During Therapy [facility] for tasks assessed/performed           Past Medical History:  Diagnosis Date  . Allergy   . Arthritis   . Asthma   . Depression     Past Surgical History:  Procedure Laterality Date  . BREAST SURGERY    . KNEE SURGERY      There were no vitals filed for this visit.    Subjective Assessment - 03/18/20 2321    Subjective Pt reports she has been having R shoulder pain and limited motion for the past 9 months, but for the past 5 months it has been moving better and has not been hurting as much.    Limitations Lifting;House hold activities;Other (comment)   Overhead activities   Patient Stated Goals To have less pain and better use of my R shoulder.    Currently in Pain? Yes    Pain Score 5     Pain Location Shoulder    Pain Orientation Right    Pain Descriptors / Indicators Aching;Sharp    Pain Type Chronic pain    Pain Onset More than a month ago    Pain Frequency Intermittent    Aggravating Factors  End range of mothions    Pain Relieving Factors Rest. Use of arm in limited ROM    Effect of Pain on Daily Activities Limits use of R arm              [facility] PT Assessment - 03/18/20 0001      Assessment   Medical  Diagnosis Pain in joint of right shoulder    Referring Provider (PT) Shade Flood, MD    Onset Date/Surgical Date --   9 months   Hand Dominance Right    Next MD Visit 03/23/20    Prior Therapy No      Precautions   Precautions None      Restrictions   Weight Bearing Restrictions No      Home Environment   Living Environment Private residence    Living Arrangements Spouse/significant other    Type of Home House    Home Access Stairs to enter    Entrance Stairs-Rails None    Home Layout Two level    Alternate Level Stairs-Number of Steps 14    Home Equipment --   NWB scooter     Prior Function   Level of Independence Independent with household mobility without device    Vocation Full time employment    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     Cognition   Overall Cognitive Status Within Functional Limits for tasks  assessed      Observation/Other Assessments   Focus on Therapeutic Outcomes (FOTO)  33 % limitation      Sensation   Light Touch Appears Intact      Posture/Postural Control   Posture/Postural Control Postural limitations    Postural Limitations Forward head;Rounded Shoulders      ROM / Strength   AROM / PROM / Strength AROM;PROM;Strength      AROM   Overall AROM Comments R shoulder pain at the end range of movements    AROM Assessment Site Shoulder    Right/Left Shoulder Right    Right Shoulder Flexion 105 Degrees    Right Shoulder ABduction 93 Degrees    Right Shoulder Internal Rotation --    Right Shoulder External Rotation --      PROM   Overall PROM Comments R shoulder pain at the end range of movements    PROM Assessment Site Shoulder    Right/Left Shoulder Right    Right Shoulder Flexion 120 Degrees    Right Shoulder ABduction 130 Degrees    Right Shoulder Internal Rotation 45 Degrees    Right Shoulder External Rotation 25 Degrees      Strength   Overall Strength Comments In a neutral  shoulder position pt demonstrated 4+ to 5/5  strength      Special Tests    Special Tests Rotator Cuff Impingement    Rotator Cuff Impingment tests Leanord Asal test;Empty Can test;Full Can test      Hawkins-Kennedy test   Findings Negative    Side Right      Empty Can test   Findings Negative    Side Right      Full Can test   Findings Negative    Side Right      Ambulation/Gait   Ambulation/Gait Yes    Ambulation/Gait Assistance 6: Modified independent (Device/Increase time)    Assistive device --   NWB scooter                     Objective measurements completed on examination: See above findings.               PT Education - 03/18/20 2250    Education Details [place] of eval, POC, HEP    Person(s) Educated Patient    Methods Explanation;Demonstration;Tactile cues;Verbal cues;Handout    Comprehension Verbalized understanding;Returned demonstration;Verbal cues required;Tactile cues required;Need further instruction            PT Short Term Goals - 03/18/20 2347      PT SHORT TERM GOAL #1   Title Pt wil be Ind in an initial HEP    Baseline Started on eval    Time 2    Period Weeks    Status New    Target Date 04/01/20      PT SHORT TERM GOAL #2   Title Pt will voice understanding of measures to help reduce and manage R shoulder pain    Time 2    Period Weeks    Status New    Target Date 04/01/20             PT Long Term Goals - 03/18/20 2348      PT LONG TERM GOAL #1   Title R shoulder AROM to flex 130d, abd 140d, IR 55d and ER 50d    Baseline 105, 93, 25, 45 respectively    Time 4    Period Weeks    Status New  Target Date 04/13/20      PT LONG TERM GOAL #2   Title With daily activities, pt will report a decrease in R sholder pain to 3/10 or less.    Baseline 5/10    Time 4    Period Weeks    Status New    Target Date 04/13/20      PT LONG TERM GOAL #3   Title Pt will report improved use of the R shoulder to  lifting a 50lb box overhead to a little bit if difficulty    Baseline Much difficulty    Time 4    Period Weeks    Status New    Target Date 04/13/20      PT LONG TERM GOAL #4   Title FOTO score will improve to 26% limitation    Baseline 33 % limitation    Time 4    Status New    Target Date 04/15/20      PT LONG TERM GOAL #5   Title Pt will be Ind in a final HEP    Time 4    Period Weeks    Status New    Target Date 04/13/20                  Plan - 03/18/20 2252    Clinical Impression Statement Pt presents ot PT appearing to be in the thawing stage of adhesive capsulitis of the R shoulder. Pt will benefit from PT 1w4 for ROM, strengthening, and postural exs and modalities for pain management to reduce pain and improve functional mobility of the R UE. Pt has recently had a L ankle sprain and is using a NWB scooter.    Personal Factors and Comorbidities Comorbidity 1    Comorbidities arthritis    Examination-Activity Limitations Reach Overhead;Lift    Stability/Clinical Decision Making Stable/Uncomplicated    Clinical Decision Making Low    Rehab Potential Good    PT Frequency 1x / week    PT Duration 4 weeks    PT Treatment/Interventions ADLs/Self Care Home Management;Cryotherapy;Electrical Stimulation;Iontophoresis 4mg /ml Dexamethasone;Moist Heat;Ultrasound;Therapeutic exercise;Therapeutic activities;Patient/family education;Manual techniques;Dry needling;Taping;Vasopneumatic Device    PT Next Visit Plan Assess response to HEP    PT Home Exercise Plan : wand exs for R shoulder flex, abd, ER, IR    Consulted and Agree with Plan of Care Patient           Patient will benefit from skilled therapeutic intervention in order to improve the following deficits and impairments:  Postural dysfunction, Pain, Impaired UE functional use, Decreased range of motion  Visit Diagnosis: Pain in joint of right shoulder  Adhesive capsulitis of right shoulder  Decreased  ROM of right shoulder     Problem List Patient Active Problem List   Diagnosis Date Noted  . Generalized anxiety disorder 11/03/2017  . Neuropathy 11/04/2016  . Paresthesias 11/04/2016    01/04/2017 MS, PT 03/18/20 11:55 PM  [facility] Outpatient Rehabilitation [facility] [address] [place], [place], [place] Phone: [phone]   Fax:  [phone]  Name: KHALIAH BARNICK MRN: Marissa Calamity Date of Birth: 05/05/1976

## 2020-03-23 ENCOUNTER — Ambulatory Visit: Payer: 59 | Admitting: Family Medicine

## 2020-03-23 ENCOUNTER — Encounter: Payer: Self-pay | Admitting: Physical Therapy

## 2020-03-23 ENCOUNTER — Other Ambulatory Visit: Payer: Self-pay

## 2020-03-23 ENCOUNTER — Encounter: Payer: Self-pay | Admitting: Family Medicine

## 2020-03-23 ENCOUNTER — Ambulatory Visit (INDEPENDENT_AMBULATORY_CARE_PROVIDER_SITE_OTHER): Payer: 59

## 2020-03-23 ENCOUNTER — Ambulatory Visit: Payer: 59 | Admitting: Physical Therapy

## 2020-03-23 VITALS — BP 137/86 | HR 84 | Temp 98.1°F | Ht 65.0 in | Wt 195.0 lb

## 2020-03-23 DIAGNOSIS — S93492A Sprain of other ligament of left ankle, initial encounter: Secondary | ICD-10-CM

## 2020-03-23 DIAGNOSIS — M25511 Pain in right shoulder: Secondary | ICD-10-CM

## 2020-03-23 DIAGNOSIS — M25611 Stiffness of right shoulder, not elsewhere classified: Secondary | ICD-10-CM

## 2020-03-23 DIAGNOSIS — M7501 Adhesive capsulitis of right shoulder: Secondary | ICD-10-CM

## 2020-03-23 DIAGNOSIS — M25562 Pain in left knee: Secondary | ICD-10-CM | POA: Diagnosis not present

## 2020-03-23 DIAGNOSIS — M79672 Pain in left foot: Secondary | ICD-10-CM | POA: Diagnosis not present

## 2020-03-23 DIAGNOSIS — S82832A Other fracture of upper and lower end of left fibula, initial encounter for closed fracture: Secondary | ICD-10-CM

## 2020-03-23 DIAGNOSIS — Z23 Encounter for immunization: Secondary | ICD-10-CM

## 2020-03-23 NOTE — Progress Notes (Signed)
Subjective:  Patient ID: Ann Sampson, female    DOB: 1976/06/08  Age: 44 y.o. MRN: [id number]  CC:  Chief Complaint  Patient presents with  . Shoulder Pain    Pt has pain in her R shoulder. PT was sent to physical therapy on 03/15/2020 pt had another appt with them earlier today. Pt states that the PT thinks she may have a frozen shoulder. PT has no other conserns at this time.  . Foot Pain    pt went to the ER on 03/13/2020 because she fell. PT was told she sprained her foot. pt states it was swollen alot more than it is today. pt wants providers thoughts about her foot if possible. X-rays were done on 03/13/2020    HPI Ann Sampson presents for   Right shoulder pain Discussed at August visit.  Previously bilateral shoulder pain, arm dysesthesias, had EMG with neurology without evidence of neuropathy/radiculopathy.  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  Treated with gabapentin 300 mg nightly.  Also with component of trapezius spasm/neck spasm at previous visits.  Gabapentin has been working well, but still with some right arm symptoms last visit, some limited movement of right shoulder.  She was having some improved motion but still not full range of motion.  Agreed to physical therapy, status post 2 visits, second 1 today.  Per review of note, she was having improving range of motion, mostly limited in abduction and internal rotation.  Plan for PT once per week for the next 4 weeks. Feels like improving.   Left ankle sprain Date of injury September 14. No prior ankle sprain/fracture.  Evaluated September 14/15th at [facility] emergency room.  Ankle x-ray negative for fracture at that time.  Tender to palpation over lateral malleolus, swelling, neurovascular intact distally.  No proximal fibular tenderness to palpation.  Diagnosed with ankle sprain.  Recommend rest, ice, compression and elevation. No bracing. Had difficulty with crutches. Using scooter. Swelling  decreased. Some light wb tolerated, but not full weight bear.  Pain is improving. Moving toes some, but no other specific ROM exercises.  Tx: ice initially. Initially ibuprofen at night - none in past 6 days.     History Patient Active Problem List   Diagnosis Date Noted  . Generalized anxiety disorder 11/03/2017  . Neuropathy 11/04/2016  . Paresthesias 11/04/2016   Past Medical History:  Diagnosis Date  . Allergy   . Arthritis   . Asthma   . Depression    Past Surgical History:  Procedure Laterality Date  . BREAST SURGERY    . KNEE SURGERY     Allergies  Allergen Reactions  . Bextra [Valdecoxib]   . Flagyl [Metronidazole]   . Prednisone     Anaphylaxis   . Vioxx [Rofecoxib]    Prior to Admission medications   Medication Sig Start Date End Date Taking? Authorizing Provider  EPINEPHrine 0.3 mg/0.3 mL IJ SOAJ injection USE AS DIRECTED 08/10/19  Yes Shade Flood, MD  escitalopram (LEXAPRO) 20 MG tablet Take 1 tablet (20 mg total) by mouth daily. 02/15/20  Yes Shade Flood, MD  gabapentin (NEURONTIN) 300 MG capsule Take 1 capsule (300 mg total) by mouth at bedtime. 12/01/19  Yes McCue, Shanda Bumps, NP  Multiple Vitamins-Minerals (ADULT GUMMY PO) Take by mouth.   Yes [provider]  mupirocin nasal ointment (BACTROBAN NASAL) 2 % Place 1 application into the nose 2 (two) times daily. Use one-half of tube in each nostril twice daily  for five (5) days. After application, press sides of nose together and gently massage. 02/07/19  Yes Shade Flood, MD  naproxen (NAPROSYN) 500 MG tablet Take 1 tablet (500 mg total) by mouth 2 (two) times daily. 03/14/20  Yes Horton, Mayer Masker, MD  cetirizine (ZYRTEC) 10 MG tablet Take 1 tablet (10 mg total) by mouth daily for 5 days. 01/30/20 02/04/20  Georgina Quint, MD  famotidine (PEPCID) 40 MG tablet Take 1 tablet (40 mg total) by mouth daily for 5 days. 01/30/20 02/04/20  Georgina Quint, MD   Social History    Socioeconomic History  . Marital status: Unknown    Spouse name: Not on file  . Number of children: Not on file  . Years of education: Not on file  . Highest education level: Not on file  Occupational History  . Not on file  Tobacco Use  . Smoking status: Former Games developer  . Smokeless tobacco: Never Used  Substance and Sexual Activity  . Alcohol use: Yes    Alcohol/week: 1.0 standard drink    Types: 1 Glasses of wine per week    Comment: occcasionally  . Drug use: No  . Sexual activity: Not on file  Other Topics Concern  . Not on file  Social History Narrative  . Not on file   Social Determinants of Health   Financial Resource Strain:   . Difficulty of Paying Living Expenses: Not on file  Food Insecurity:   . Worried About Programme researcher, broadcasting/film/video in the Last Year: Not on file  . Ran Out of Food in the Last Year: Not on file  Transportation Needs:   . Lack of Transportation (Medical): Not on file  . Lack of Transportation (Non-Medical): Not on file  Physical Activity:   . Days of Exercise per Week: Not on file  . Minutes of Exercise per Session: Not on file  Stress:   . Feeling of Stress : Not on file  Social Connections:   . Frequency of Communication with Friends and Family: Not on file  . Frequency of Social Gatherings with Friends and Family: Not on file  . Attends Religious Services: Not on file  . Active Member of Clubs or Organizations: Not on file  . Attends Banker Meetings: Not on file  . Marital Status: Not on file  Intimate Partner Violence:   . Fear of Current or Ex-Partner: Not on file  . Emotionally Abused: Not on file  . Physically Abused: Not on file  . Sexually Abused: Not on file    Review of Systems Per hpi.   Objective:   Vitals:   03/23/20 1406  BP: 137/86  Pulse: 84  Temp: 98.1 F (36.7 C)  TempSrc: Temporal  SpO2: 97%  Weight: 195 lb (88.5 kg)  Height: 5\' 5"  (1.651 m)     Physical Exam Vitals reviewed.   Constitutional:      General: She is not in acute distress.    Appearance: She is well-developed.  HENT:     Head: Normocephalic and atraumatic.  Cardiovascular:     Rate and Rhythm: Normal rate.  Pulmonary:     Effort: Pulmonary effort is normal.  Musculoskeletal:     Comments: R shoulder: Abduction approx 100, IR to L4-5. approx 150 flex.   L  knee/prox fib nontender.   L tib fib - ttp over distal tibia - medial approx 4 cm prox to malleolus, as well as anterior lower leg, anterior  fibula in similar area.  No crepitus.  Skin intact.  L  Ankle - dec rom.  Some guarding.  No malleolar ttp.  Various areas of faded ecchymosis.  L foot - ttp navicula, mid aspect 1st metatarsal. 5th mt nontender, no other bony ttp. sts with fading echymosis, nvi distally. Skin intact.   Skin:    Comments: echymosis - fading in lower leg/ankle.   Neurological:     Mental Status: She is alert and oriented to person, place, and time.          DG Tibia/Fibula Left  Result Date: 03/23/2020 CLINICAL DATA:  Left lower leg, ankle and foot pain. Injury 9 days ago. EXAM: LEFT TIBIA AND FIBULA - 2 VIEW COMPARISON:  Foot and ankle radiographs 03/14/2020 FINDINGS: Linear lucency projects over the distal fibula obliquely at the level of the ankle mortise, suspicious for nondisplaced fracture. This is not visualized on prior exam. No other fracture of the lower leg. No fracture of the tibia. The ankle mortise is preserved. Mild soft tissue edema laterally. IMPRESSION: Linear lucency projects over the distal fibula obliquely at the level of the ankle mortise, suspicious for nondisplaced fracture. This was not seen on ankle radiographs 9 days ago. Electronically Signed   By: Narda RutherfordMelanie  Sanford M.D.   On: 03/23/2020 15:28   DG Foot Complete Left  Result Date: 03/23/2020 CLINICAL DATA:  Left lower leg, ankle, and foot pain. Injury 9 days ago. EXAM: LEFT FOOT - COMPLETE 3+ VIEW COMPARISON:  Foot and ankle radiographs 9  days ago 03/14/2020 FINDINGS: There is no evidence of fracture or dislocation. The navicular appears intact. There is no evidence of arthropathy or other focal bone abnormality. There may be a small ankle joint effusion. There is soft tissue edema overlying the metatarsals dorsally. IMPRESSION: Dorsal soft tissue edema. No fracture or subluxation of the foot. Electronically Signed   By: Narda RutherfordMelanie  Sanford M.D.   On: 03/23/2020 15:29     Assessment & Plan:  Ann Sampson is a 44 y.o. female . Closed fracture of distal end of left fibula, unspecified fracture morphology, initial encounter - Plan: Ambulatory referral to Orthopedic Surgery, Apply cam walker Arthralgia of left lower leg - Plan: Ambulatory referral to Orthopedic Surgery, Apply cam walker High ankle sprain, left, initial encounter - Plan: DG Tibia/Fibula Left, Ambulatory referral to Orthopedic Surgery, Apply cam walker Foot pain, left - Plan: DG Foot Complete Left, Ambulatory referral to Orthopedic Surgery, Apply cam walker  -Date of injury September 14.  Initial x-ray without apparent fracture, repeat x-ray indicates possible nondisplaced distal fibula fracture.  Does have some significant fading ecchymosis, and tender to palpation of the distal fibula as well as distal tibia.  Suspicious for fracture versus high sprain.  -Continue nonweightbearing, cam walker placed.  Has scooter to use.  Refer to orthopedics.  Pain in joint of right shoulder  -Improving with physical therapy, still have component of adhesive capsulitis.  If not regaining motion, would recommend orthopedic surgeon follow-up.  Need for prophylactic vaccination and inoculation against influenza - Plan: Flu Vaccine QUAD 36+ mos IM   No orders of the defined types were placed in this encounter.  Patient Instructions     Ok to continue therapy for shoulder pain/stiffness. If range of motion is not improving, then I would recommend ortho referral - let me know and I  can place order.   Repeat x-ray does show possible fracture of the outer lower leg bone.  X-ray of the  foot looked okay.  I will refer you to orthopedics, can wear the knee brace/walking boot for now, but avoid weight to that foot until advised to do so by orthopedics.  Tylenol as needed, but let me know if you need something stronger for pain. Return to the clinic or go to the nearest emergency room if any of your symptoms worsen or new symptoms occur.     If you have lab work done today you will be contacted with your lab results within the next 2 weeks.  If you have not heard from [place] then please contact us. The fastest way to get your results is to register for My Chart.   IF you received an x-ray today, you will receive an invoice from [facility]. Please contact [facility] at [phone] with questions or concerns regarding your invoice.   IF you received labwork today, you will receive an invoice from [facility]. Please contact [facility] at [phone] with questions or concerns regarding your invoice.   Our billing staff will not be able to assist you with questions regarding bills from these companies.  You will be contacted with the lab results as soon as they are available. The fastest way to get your results is to activate your My Chart account. Instructions are located on the last page of this paperwork. If you have not heard from [place] regarding the results in 2 weeks, please contact this office.         Signed, [name], MD [facility]

## 2020-03-23 NOTE — Patient Instructions (Addendum)
   Ok to continue therapy for shoulder pain/stiffness. If range of motion is not improving, then I would recommend ortho referral - let me know and I can place order.   Repeat x-ray does show possible fracture of the outer lower leg bone.  X-ray of the foot looked okay.  I will refer you to orthopedics, can wear the knee brace/walking boot for now, but avoid weight to that foot until advised to do so by orthopedics.  Tylenol as needed, but let me know if you need something stronger for pain. Return to the clinic or go to the nearest emergency room if any of your symptoms worsen or new symptoms occur.     If you have lab work done today you will be contacted with your lab results within the next 2 weeks.  If you have not heard from [place] then please contact us. The fastest way to get your results is to register for My Chart.   IF you received an x-ray today, you will receive an invoice from [facility]. Please contact [facility] at [phone] with questions or concerns regarding your invoice.   IF you received labwork today, you will receive an invoice from [place]. Please contact [facility] at [phone] with questions or concerns regarding your invoice.   Our billing staff will not be able to assist you with questions regarding bills from these companies.  You will be contacted with the lab results as soon as they are available. The fastest way to get your results is to activate your My Chart account. Instructions are located on the last page of this paperwork. If you have not heard from [place] regarding the results in 2 weeks, please contact this office.

## 2020-03-23 NOTE — Therapy (Signed)
[facility] Outpatient Rehabilitation [facility] [address] [place], [place], [address] Phone: [phone]   Fax:  [phone]  Physical Therapy Treatment  Patient Details  Name: Ann Sampson MRN: [id number] Date of Birth: 1975-07-14 Referring Provider (PT): Shade Flood, MD   Encounter Date: 03/23/2020   PT End of Session - 03/23/20 1058    Visit Number 2    Number of Visits 5    Date for PT Re-Evaluation 04/13/20    Authorization Type [employer] OTHER    PT Start Time 1004    PT Stop Time 1045    PT Time Calculation (min) 41 min    Equipment Utilized During Treatment Other (comment)   NWB scooter due to L ankle sprain   Activity Tolerance Patient tolerated treatment well    Behavior During Therapy [facility] for tasks assessed/performed           Past Medical History:  Diagnosis Date  . Allergy   . Arthritis   . Asthma   . Depression     Past Surgical History:  Procedure Laterality Date  . BREAST SURGERY    . KNEE SURGERY      There were no vitals filed for this visit.   Subjective Assessment - 03/23/20 1006    Subjective Pt states that her right shoulder has been getting much better.    Limitations Lifting;House hold activities;Other (comment)   Overhead activities   Patient Stated Goals To have less pain and better use of my R shoulder.    Pain Onset More than a month ago              [facility] PT Assessment - 03/23/20 0001      AROM   Right Shoulder Flexion 131 Degrees   in supine   Right Shoulder ABduction 105 Degrees                         [facility] Adult PT Treatment/Exercise - 03/23/20 0001      Exercises   Exercises Neck;Shoulder      Neck Exercises: Standing   Neck Retraction 10 reps      Shoulder Exercises: Supine   Flexion Strengthening;Both;20 reps    Flexion Limitations with dowel      Shoulder Exercises: Seated   External Rotation AAROM;10 reps    External Rotation Limitations 2 sec hold  with dowel assist    Internal Rotation AAROM;Right;10 reps    Internal Rotation Limitations x5 with dowel, x5 with towel however uncomfortable for pt    Flexion AAROM;Right;10 reps    Flexion Limitations 2 sec hold with dowel assist    Abduction AAROM;Right;10 reps    ABduction Limitations 2 sec hold with dowel assist; cues to decrease shoulder hiking      Shoulder Exercises: Sidelying   External Rotation Strengthening;Right;20 reps;Weights    External Rotation Weight (lbs) 1    ABduction Strengthening;Right;20 reps;Weights    ABduction Weight (lbs) 1      Shoulder Exercises: Standing   Retraction Strengthening;10 reps;Both    Other Standing Exercises Finger wall crawl x5 in abduction & flexion      Shoulder Exercises: Pulleys   Flexion 1 minute    ABduction 1 minute      Shoulder Exercises: Stretch   Cross Chest Stretch 20 seconds    Other Shoulder Stretches sleeper stretch 10 x 5 sec hold      Manual Therapy   Manual Therapy Joint  mobilization;Passive ROM    Joint Mobilization anterior, posterior, and inferior GH glides grade II to III; distraction x10 sec   pt most restricted with inferior glides   Passive ROM Flexion and abduction                     PT Short Term Goals - 03/18/20 2347      PT SHORT TERM GOAL #1   Title Pt wil be Ind in an initial HEP    Baseline Started on eval    Time 2    Period Weeks    Status New    Target Date 04/01/20      PT SHORT TERM GOAL #2   Title Pt will voice understanding of measures to help reduce and manage R shoulder pain    Time 2    Period Weeks    Status New    Target Date 04/01/20             PT Long Term Goals - 03/18/20 2348      PT LONG TERM GOAL #1   Title R shoulder AROM to flex 130d, abd 140d, IR 55d and ER 50d    Baseline 105, 93, 25, 45 respectively    Time 4    Period Weeks    Status New    Target Date 04/13/20      PT LONG TERM GOAL #2   Title With daily activities, pt will report a  decrease in R sholder pain to 3/10 or less.    Baseline 5/10    Time 4    Period Weeks    Status New    Target Date 04/13/20      PT LONG TERM GOAL #3   Title Pt will report improved use of the R shoulder to lifting a 50lb box overhead to a little bit if difficulty    Baseline Much difficulty    Time 4    Period Weeks    Status New    Target Date 04/13/20      PT LONG TERM GOAL #4   Title FOTO score will improve to 26% limitation    Baseline 33 % limitation    Time 4    Status New    Target Date 04/15/20      PT LONG TERM GOAL #5   Title Pt will be Ind in a final HEP    Time 4    Period Weeks    Status New    Target Date 04/13/20                 Plan - 03/23/20 1043    Clinical Impression Statement Pt with improving ROM (see measurements below) -- mostly limited in abduction and internal rotation. Treatment focused on increasing AROM and strength in shoulder & scapula. Initiated posture exercises. Joint mobilization performed for [facility] joint. Pt tolerated treatment well.    Personal Factors and Comorbidities Comorbidity 1    Comorbidities arthritis    Examination-Activity Limitations Reach Overhead;Lift    Stability/Clinical Decision Making Stable/Uncomplicated    Rehab Potential Good    PT Frequency 1x / week    PT Duration 4 weeks    PT Treatment/Interventions ADLs/Self Care Home Management;Cryotherapy;Electrical Stimulation;Iontophoresis 4mg /ml Dexamethasone;Moist Heat;Ultrasound;Therapeutic exercise;Therapeutic activities;Patient/family education;Manual techniques;Dry needling;Taping;Vasopneumatic Device    PT Next Visit Plan Assess response to HEP    PT Home Exercise Plan : wand exs for R shoulder flex, abd, ER. Modified IR stretch to  sleeper stretch. Neck retraction & scapular retraction    Consulted and Agree with Plan of Care Patient           Patient will benefit from skilled therapeutic intervention in order to improve the following deficits and  impairments:  Postural dysfunction, Pain, Impaired UE functional use, Decreased range of motion  Visit Diagnosis: Pain in joint of right shoulder  Adhesive capsulitis of right shoulder  Decreased ROM of right shoulder     Problem List Patient Active Problem List   Diagnosis Date Noted  . Generalized anxiety disorder 11/03/2017  . Neuropathy 11/04/2016  . Paresthesias 11/04/2016    [facility] April Ma L Manny Vitolo PT, DPT 03/23/2020, 10:59 AM  [facility] [address] [place], [place], [address] Phone: [phone]   Fax:  [phone]  Name: HAYLEE MCANANY MRN: [id number] Date of Birth: Sep 23, 1975

## 2020-03-25 ENCOUNTER — Encounter: Payer: Self-pay | Admitting: Family Medicine

## 2020-03-26 ENCOUNTER — Encounter: Payer: Self-pay | Admitting: Orthopedic Surgery

## 2020-03-26 ENCOUNTER — Ambulatory Visit (INDEPENDENT_AMBULATORY_CARE_PROVIDER_SITE_OTHER): Payer: 59 | Admitting: Orthopedic Surgery

## 2020-03-26 VITALS — Ht 65.0 in | Wt 195.0 lb

## 2020-03-26 DIAGNOSIS — S93412A Sprain of calcaneofibular ligament of left ankle, initial encounter: Secondary | ICD-10-CM

## 2020-03-28 ENCOUNTER — Encounter: Payer: Self-pay | Admitting: Orthopedic Surgery

## 2020-03-28 NOTE — Progress Notes (Signed)
Office Visit Note   Patient: Ann Sampson           Date of Birth: 01/19/1976           MRN: [id number] Visit Date: 03/26/2020              Requested by: Shade Flood, MD [address] [place],  [place] [address] PCP: Shade Flood, MD  Chief Complaint  Patient presents with  . Left Ankle - Pain    S/p fall 03/13/20 ER xrays       HPI: Patient is a 44 year old woman who sustained a injury to her left ankle from a fall on 03/13/2020 patient was initially seen in the emergency room and radiographs obtained on 03/14/2020 did not show any fractures of the foot or ankle.  Patient had repeat radiographs on September 24 which were interpreted as a fracture of the distal fibula.  Patient is seen for initial evaluation.  Assessment & Plan: Visit Diagnoses:  1. Sprain of calcaneofibular ligament of left ankle, initial encounter     Plan: Patient does have tenderness to palpation over the medial and lateral ligaments.  She does have bruising over the syndesmosis consistent with a syndesmotic injury radiographs do not show widening.  We will have her continue with the boot repeat three-view radiographs of the left ankle at follow-up.  Follow-Up Instructions: Return in about 3 weeks (around 04/16/2020).   Ortho Exam  Patient is alert, oriented, no adenopathy, well-dressed, normal affect, normal respiratory effort. Examination patient has good pulses she has bruising over the syndesmosis there is some tenderness to palpation of the anterior talofibular ligament and the deltoid.  The medial and lateral malleolus are nontender to palpation.  Review of the initial and follow-up radiographs do not show a definitive fracture.  Imaging: No results found. No images are attached to the encounter.  Labs: Lab Results  Component Value Date   ESRSEDRATE 5 10/07/2017   ESRSEDRATE 2 02/26/2016   CRP 5.5 (H) 10/07/2017   LABURIC 4.0 10/07/2017   LABURIC 4.8 08/18/2013     Lab Results   Component Value Date   ALBUMIN 4.4 10/07/2017   ALBUMIN 4.6 07/10/2014   ALBUMIN 4.8 08/18/2013   LABURIC 4.0 10/07/2017   LABURIC 4.8 08/18/2013    No results found for: MG Lab Results  Component Value Date   VD25OH 45.1 02/26/2016    No results found for: PREALBUMIN CBC EXTENDED Latest Ref Rng & Units 10/07/2017 09/25/2016 07/10/2014  WBC 3.4 - 10.8 x10E3/uL 5.2 6.8 7.0  RBC 3.77 - 5.28 x10E6/uL 4.61 4.75 5.04  HGB 11.1 - 15.9 g/dL 68.1 27.5 15.3(H)  HCT 34.0 - 46.6 % 42.3 43.6 45.1  PLT 150 - 379 x10E3/uL 267 - 285  NEUTROABS 1 - 7 x10E3/uL 2.8 - -  LYMPHSABS 0 - 3 x10E3/uL 2.1 - -     Body mass index is 32.45 kg/m.  Orders:  No orders of the defined types were placed in this encounter.  No orders of the defined types were placed in this encounter.    Procedures: No procedures performed  Clinical Data: No additional findings.  ROS:  All other systems negative, except as noted in the HPI. Review of Systems  Objective: Vital Signs: Ht 5\' 5"  (1.651 m)   Wt 195 lb (88.5 kg)   LMP 02/26/2020   BMI 32.45 kg/m   Specialty Comments:  No specialty comments available.  PMFS History: Patient Active Problem List  Diagnosis Date Noted  . Generalized anxiety disorder 11/03/2017  . Neuropathy 11/04/2016  . Paresthesias 11/04/2016   Past Medical History:  Diagnosis Date  . Allergy   . Arthritis   . Asthma   . Depression     Family History  Problem Relation Age of Onset  . Skin cancer Mother   . Asthma Son   . Seizures Maternal Grandmother   . Cancer Maternal Grandmother   . Stroke Maternal Grandmother   . Neuropathy Maternal Grandfather   . Diabetic kidney disease Maternal Grandfather   . Diabetes Maternal Grandfather   . Heart disease Paternal Grandfather   . Hyperlipidemia Paternal Grandfather   . Hypertension Paternal Grandfather     Past Surgical History:  Procedure Laterality Date  . BREAST SURGERY    . KNEE SURGERY     Social History     Occupational History  . Not on file  Tobacco Use  . Smoking status: Former Games developer  . Smokeless tobacco: Never Used  Substance and Sexual Activity  . Alcohol use: Yes    Alcohol/week: 1.0 standard drink    Types: 1 Glasses of wine per week    Comment: occcasionally  . Drug use: No  . Sexual activity: Not on file

## 2020-03-29 ENCOUNTER — Ambulatory Visit: Payer: 59 | Admitting: Physical Therapy

## 2020-03-29 ENCOUNTER — Encounter: Payer: Self-pay | Admitting: Physical Therapy

## 2020-03-29 ENCOUNTER — Other Ambulatory Visit: Payer: Self-pay

## 2020-03-29 DIAGNOSIS — M7501 Adhesive capsulitis of right shoulder: Secondary | ICD-10-CM

## 2020-03-29 DIAGNOSIS — M25511 Pain in right shoulder: Secondary | ICD-10-CM

## 2020-03-29 DIAGNOSIS — M25611 Stiffness of right shoulder, not elsewhere classified: Secondary | ICD-10-CM

## 2020-03-29 NOTE — Therapy (Signed)
[facility] Outpatient Rehabilitation [facility] [address] [place], [place], [address] Phone: [phone]   Fax:  [phone]  Physical Therapy Treatment  Patient Details  Name: Ann Sampson MRN: [id number] Date of Birth: [birth year] Referring Provider (PT): Shade Flood, MD   Encounter Date: 03/29/2020   PT End of Session - 03/29/20 1341    Visit Number 3    Number of Visits 5    Date for PT Re-Evaluation 04/13/20    Authorization Type [employer] OTHER    PT Start Time 1323    PT Stop Time 1401    PT Time Calculation (min) 38 min           Past Medical History:  Diagnosis Date  . Allergy   . Arthritis   . Asthma   . Depression     Past Surgical History:  Procedure Laterality Date  . BREAST SURGERY    . KNEE SURGERY      There were no vitals filed for this visit.   Subjective Assessment - 03/29/20 1326    Subjective No pain in shoulder. Doing the exercises frequently, not twice a day.    Currently in Pain? No/denies              [facility] PT Assessment - 03/29/20 0001      PROM   Right Shoulder Flexion 135 Degrees    Right Shoulder Internal Rotation 45 Degrees    Right Shoulder External Rotation 40 Degrees                         [facility] Adult PT Treatment/Exercise - 03/29/20 0001      Shoulder Exercises: Supine   Flexion Strengthening;Both;20 reps    Flexion Limitations with dowel      Shoulder Exercises: Seated   Other Seated Exercises seated UE pullovers with dowel x 15 , Seated scaption with dowel     Other Seated Exercises Seated red band horizontal abduction and ER x 20 each       Shoulder Exercises: Sidelying   External Rotation Strengthening;Right;20 reps;Weights    External Rotation Weight (lbs) 1    ABduction Strengthening;Right;20 reps;Weights    ABduction Weight (lbs) 1      Shoulder Exercises: Standing   Extension 20 reps    Theraband Level (Shoulder Extension) Level 3 (Green)     Extension Limitations seated     Retraction Limitations green band seated row x 20       Shoulder Exercises: Pulleys   Flexion 1 minute    ABduction 1 minute      Manual Therapy   Joint Mobilization anterior, posterior, and inferior GH glides grade II to III; distraction x10 sec   pt most restricted with inferior glides   Passive ROM Flexion and abduction , ER, IR                    PT Short Term Goals - 03/18/20 2347      PT SHORT TERM GOAL #1   Title Pt wil be Ind in an initial HEP    Baseline Started on eval    Time 2    Period Weeks    Status New    Target Date 04/01/20      PT SHORT TERM GOAL #2   Title Pt will voice understanding of measures to help reduce and manage R shoulder pain    Time 2    Period  Weeks    Status New    Target Date 04/01/20             PT Long Term Goals - 03/18/20 2348      PT LONG TERM GOAL #1   Title R shoulder AROM to flex 130d, abd 140d, IR 55d and ER 50d    Baseline 105, 93, 25, 45 respectively    Time 4    Period Weeks    Status New    Target Date 04/13/20      PT LONG TERM GOAL #2   Title With daily activities, pt will report a decrease in R sholder pain to 3/10 or less.    Baseline 5/10    Time 4    Period Weeks    Status New    Target Date 04/13/20      PT LONG TERM GOAL #3   Title Pt will report improved use of the R shoulder to lifting a 50lb box overhead to a little bit if difficulty    Baseline Much difficulty    Time 4    Period Weeks    Status New    Target Date 04/13/20      PT LONG TERM GOAL #4   Title FOTO score will improve to 26% limitation    Baseline 33 % limitation    Time 4    Status New    Target Date 04/15/20      PT LONG TERM GOAL #5   Title Pt will be Ind in a final HEP    Time 4    Period Weeks    Status New    Target Date 04/13/20                 Plan - 03/29/20 1341    Clinical Impression Statement Pt report she is not noticing shoulder pain as much. She reports  continued difficulty with lateral reaching. Her ROM has improved. Continued PROM AAROM and gentle strengthening progression. She has pain with end range PROM. Otherwise session tolerated well with min increases in pain.    PT Next Visit Plan Assess response to HEP, progress ROM , add stretches    PT Home Exercise Plan [id number]: wand exs for R shoulder flex, abd, ER. Modified IR stretch to sleeper stretch. Neck retraction & scapular retraction           Patient will benefit from skilled therapeutic intervention in order to improve the following deficits and impairments:  Postural dysfunction, Pain, Impaired UE functional use, Decreased range of motion  Visit Diagnosis: Pain in joint of right shoulder  Adhesive capsulitis of right shoulder  Decreased ROM of right shoulder     Problem List Patient Active Problem List   Diagnosis Date Noted  . Generalized anxiety disorder 11/03/2017  . Neuropathy 11/04/2016  . Paresthesias 11/04/2016    Ann Sampson, PTA 03/29/2020, 2:21 PM  [facility] [address] [place], [place], [address] Phone: [phone]   Fax:  [phone]  Name: Ann Sampson MRN: [id number] Date of Birth: 01-Oct-1975

## 2020-04-03 ENCOUNTER — Encounter: Payer: Self-pay | Admitting: Physical Therapy

## 2020-04-03 ENCOUNTER — Other Ambulatory Visit: Payer: Self-pay

## 2020-04-03 ENCOUNTER — Ambulatory Visit: Payer: 59 | Attending: Family Medicine | Admitting: Physical Therapy

## 2020-04-03 DIAGNOSIS — M25611 Stiffness of right shoulder, not elsewhere classified: Secondary | ICD-10-CM | POA: Insufficient documentation

## 2020-04-03 DIAGNOSIS — M7501 Adhesive capsulitis of right shoulder: Secondary | ICD-10-CM | POA: Diagnosis present

## 2020-04-03 DIAGNOSIS — M25511 Pain in right shoulder: Secondary | ICD-10-CM | POA: Insufficient documentation

## 2020-04-03 NOTE — Therapy (Signed)
[facility] Outpatient Rehabilitation [facility] [address] [place], [place], [address] Phone: [phone]   Fax:  [phone]  Physical Therapy Treatment  Patient Details  Name: Ann Sampson MRN: [id number] Date of Birth: July 22, 1975 Referring Provider (PT): Shade Flood, MD   Encounter Date: 04/03/2020   PT End of Session - 04/03/20 1234    Visit Number 4    Number of Visits 5    Date for PT Re-Evaluation 04/13/20    Authorization Type [employer] OTHER    PT Start Time 1232    PT Stop Time 1314    PT Time Calculation (min) 42 min           Past Medical History:  Diagnosis Date  . Allergy   . Arthritis   . Asthma   . Depression     Past Surgical History:  Procedure Laterality Date  . BREAST SURGERY    . KNEE SURGERY      There were no vitals filed for this visit.   Subjective Assessment - 04/03/20 1320    Subjective Pt reports less shoulder pain today. less pain with reaching out to the side.    Currently in Pain? No/denies                             [facility] Adult PT Treatment/Exercise - 04/03/20 0001      Shoulder Exercises: Supine   Horizontal ABduction 20 reps    Theraband Level (Shoulder Horizontal ABduction) Level 3 (Green)    Flexion 20 reps    Flexion Limitations with dowel    Diagonals 15 reps    Theraband Level (Shoulder Diagonals) Level 2 (Red)      Shoulder Exercises: Seated   Elevation Limitations scaption 1# x 10 to 90 degrees     Extension 20 reps    Theraband Level (Shoulder Extension) Level 3 (Green)    Row 20 reps    Theraband Level (Shoulder Row) Level 3 (Green)    Protraction Limitations seated punch red band x 20     Internal Rotation 20 reps    Theraband Level (Shoulder Internal Rotation) Level 3 (Green)    Other Seated Exercises seated 1# pres up x15 , pullovers with dowel x 15     Other Seated Exercises Seated red band horizontal abduction and ER x 20 each       Shoulder  Exercises: Sidelying   External Rotation Strengthening;Right;20 reps;Weights    External Rotation Weight (lbs) 1    ABduction Strengthening;Right;20 reps;Weights    ABduction Weight (lbs) 1      Shoulder Exercises: Pulleys   Flexion 2 minutes    ABduction 1 minute      Shoulder Exercises: ROM/Strengthening   Other ROM/Strengthening Exercises abduction wall slide, external rotation stretch at wall, arm and neutral and in abduction       Manual Therapy   Joint Mobilization anterior, posterior, and inferior GH glides grade II to III; distraction x10 sec   pt most restricted with inferior glides   Passive ROM Flexion and abduction , ER, IR                    PT Short Term Goals - 03/18/20 2347      PT SHORT TERM GOAL #1   Title Pt wil be Ind in an initial HEP    Baseline Started on eval    Time 2  Period Weeks    Status New    Target Date 04/01/20      PT SHORT TERM GOAL #2   Title Pt will voice understanding of measures to help reduce and manage R shoulder pain    Time 2    Period Weeks    Status New    Target Date 04/01/20             PT Long Term Goals - 03/18/20 2348      PT LONG TERM GOAL #1   Title R shoulder AROM to flex 130d, abd 140d, IR 55d and ER 50d    Baseline 105, 93, 25, 45 respectively    Time 4    Period Weeks    Status New    Target Date 04/13/20      PT LONG TERM GOAL #2   Title With daily activities, pt will report a decrease in R sholder pain to 3/10 or less.    Baseline 5/10    Time 4    Period Weeks    Status New    Target Date 04/13/20      PT LONG TERM GOAL #3   Title Pt will report improved use of the R shoulder to lifting a 50lb box overhead to a little bit if difficulty    Baseline Much difficulty    Time 4    Period Weeks    Status New    Target Date 04/13/20      PT LONG TERM GOAL #4   Title FOTO score will improve to 26% limitation    Baseline 33 % limitation    Time 4    Status New    Target Date 04/15/20       PT LONG TERM GOAL #5   Title Pt will be Ind in a final HEP    Time 4    Period Weeks    Status New    Target Date 04/13/20                 Plan - 04/03/20 1300    Clinical Impression Statement Pt reports less shoulder pain and less pain with lateral reaching which was previously most painful. Able to continue strengthening and ROM without C/O pain, only fatigue. Progressed with standing ER stretches at wall and abduction wall slides.    PT Next Visit Plan Assess response to HEP, progress ROM , add stretches, will need renewal to continue, FOTO    PT Home Exercise Plan [id number]: wand exs for R shoulder flex, abd, ER. Modified IR stretch to sleeper stretch. Neck retraction & scapular retraction           Patient will benefit from skilled therapeutic intervention in order to improve the following deficits and impairments:  Postural dysfunction, Pain, Impaired UE functional use, Decreased range of motion  Visit Diagnosis: Pain in joint of right shoulder  Adhesive capsulitis of right shoulder  Decreased ROM of right shoulder     Problem List Patient Active Problem List   Diagnosis Date Noted  . Generalized anxiety disorder 11/03/2017  . Neuropathy 11/04/2016  . Paresthesias 11/04/2016    Sherrie Mustache, PTA 04/03/2020, 1:24 PM  [facility] [address] [place], [place], [address] Phone: [phone]   Fax:  [phone]  Name: Ann Sampson MRN: [id number] Date of Birth: 08-Sep-1975

## 2020-04-09 ENCOUNTER — Other Ambulatory Visit: Payer: Self-pay

## 2020-04-09 ENCOUNTER — Ambulatory Visit: Payer: 59

## 2020-04-09 DIAGNOSIS — M25511 Pain in right shoulder: Secondary | ICD-10-CM | POA: Diagnosis not present

## 2020-04-09 DIAGNOSIS — M7501 Adhesive capsulitis of right shoulder: Secondary | ICD-10-CM

## 2020-04-09 DIAGNOSIS — M25611 Stiffness of right shoulder, not elsewhere classified: Secondary | ICD-10-CM

## 2020-04-09 NOTE — Therapy (Signed)
[facility] °[address] °[place], [place], [address] °Phone: [phone]   Fax:  [phone] ° °Physical Therapy Treatment/Discharge Summary ° °Patient Details  °Name: Ann Sampson °MRN: [id number] °Date of Birth: 11/04/1975 °Referring Provider (PT): Greene, Jeffrey R, MD ° ° °Encounter Date: 04/09/2020 ° ° PT End of Session - 04/09/20 2224   ° Visit Number 5   ° Number of Visits 5   ° Date for PT Re-Evaluation 04/09/20   ° Authorization Type [employer] OTHER   ° PT Start Time 1507   ° PT Stop Time 1555   ° PT Time Calculation (min) 48 min   ° Activity Tolerance Patient tolerated treatment well   ° Behavior During Therapy WFL for tasks assessed/performed   °  °  °  ° ° °Past Medical History:  °Diagnosis Date  °• Allergy   °• Arthritis   °• Asthma   °• Depression   ° ° °Past Surgical History:  °Procedure Laterality Date  °• BREAST SURGERY    °• KNEE SURGERY    ° ° °There were no vitals filed for this visit. ° ° Subjective Assessment - 04/09/20 2218   ° Subjective Pt reports being pleased with her progress with both the decrease in her R shoulder pain and improved AROM. Pt states she would like to DC PT, and continue her rehab at home c her HEP.   ° Patient Stated Goals To have less pain and better use of my R shoulder.   ° Currently in Pain? Yes   ° Pain Score 2    ° Pain Location Shoulder   ° Pain Orientation Right   ° Pain Descriptors / Indicators Aching;Tightness   ° Pain Type Chronic pain   ° Pain Onset More than a month ago   ° Pain Frequency Occasional   ° Aggravating Factors  Reaching behind her   ° Pain Relieving Factors Exercises; rest as indicated   ° Effect of Pain on Daily Activities Limits reaching behind   °  °  °  ° ° ° ° ° ° ° ° ° ° ° ° ° ° ° ° ° ° ° ° [facility] Adult PT Treatment/Exercise - 04/09/20 0001   °  ° Exercises  ° Exercises Shoulder   °  ° Shoulder Exercises: Standing  ° Flexion AROM;Strengthening;Right;15 reps   ° Flexion  Limitations 10 sec; wand   ° ABduction AROM;Strengthening;15 reps   ° ABduction Limitations 10 sec; wand   °  ° Shoulder Exercises: ROM/Strengthening  ° Other ROM/Strengthening Exercises flexion and abduction wall slides, external rotation stretch at wall, arm and neutral and in abduction  3x each c 20 sec hold   °  ° Shoulder Exercises: Stretch  ° Internal Rotation Stretch 20 seconds   ° Internal Rotation Stretch Limitations c towel acros back and superiorly up back   ° Other Shoulder Stretches sleeper stretch 3x 20 sec hold   °  °  °  ° ° ° ° ° ° ° ° ° PT Education - 04/09/20 2223   ° Education Details Final HEP developed   ° Person(s) Educated Patient   ° Methods Explanation;Demonstration;Tactile cues;Verbal cues;Handout   ° Comprehension Verbalized understanding;Returned demonstration;Verbal cues required;Tactile cues required   °  °  °  ° ° ° PT Short Term Goals - 04/09/20 2233   °  ° PT SHORT TERM GOAL #1  ° Title Pt wil be Ind in an initial HEP.   Achieved    Status Achieved    Target Date 04/09/20      PT SHORT TERM GOAL #2   Title Pt will voice understanding of measures to help reduce and manage R shoulder pain. Achieved    Status Achieved    Target Date 04/09/20             PT Long Term Goals - 04/09/20 2234      PT LONG TERM GOAL #1   Title R shoulder AROM to flex 130d, abd 140d, IR 55d and ER 50d. Achieved flex 146d, abd 142d, IR 62d, ER 75d    Baseline 105, 93, 25, 45 respectively    Status Achieved    Target Date 04/09/20      PT LONG TERM GOAL #2   Title With daily activities, pt will report a decrease in R sholder pain to 3/10 or less. Achieved 2/10 or less pain    Baseline 5/10    Status Achieved    Target Date 04/09/20      PT LONG TERM GOAL #3   Title Pt will report improved use of the R shoulder to lifting a 50lb box overhead to a little bit if difficulty.Partial met: pt reports smoe difficulty. Pt reports no difficulty with lifting 25lbs.    Baseline Much difficulty     Status Partially Met    Target Date 04/09/20      PT LONG TERM GOAL #4   Title FOTO score will improve to 26% limitation. Improved, but not met: 30%    Baseline 33 % limitation    Status New    Target Date 04/09/20      PT LONG TERM GOAL #5   Title Pt will be Ind in a final HEP    Status Achieved                 Plan - 04/09/20 2240    Clinical Impression Statement Pt has made very good progress with the pain and function of her R shoulder. The majority of pt's goals have been met. The pt reports she is doing well enough to continue with her R shoulder rehab at home with her HEP.    Personal Factors and Comorbidities Comorbidity 1    Comorbidities arthritis    Examination-Activity Limitations Reach Overhead;Lift    Stability/Clinical Decision Making Stable/Uncomplicated    PT Duration 4 weeks    PT Treatment/Interventions ADLs/Self Care Home Management;Cryotherapy;Electrical Stimulation;Iontophoresis 65m/ml Dexamethasone;Moist Heat;Ultrasound;Therapeutic exercise;Therapeutic activities;Patient/family education;Manual techniques;Dry needling;Taping;Vasopneumatic Device    PT Home Exercise Plan [phone] See instruction section    Consulted and Agree with Plan of Care Patient           Patient will benefit from skilled therapeutic intervention in order to improve the following deficits and impairments:  Postural dysfunction, Pain, Impaired UE functional use, Decreased range of motion  Visit Diagnosis: Pain in joint of right shoulder  Adhesive capsulitis of right shoulder  Decreased ROM of right shoulder     Problem List Patient Active Problem List   Diagnosis Date Noted   Generalized anxiety disorder 11/03/2017   Neuropathy 11/04/2016   Paresthesias 11/04/2016    AGar PontoMS, PT 04/09/20 10:46 PM   PHYSICAL THERAPY DISCHARGE SUMMARY  Visits from Start of Care: 5  Current functional level related to goals / functional outcomes: See above   Remaining deficits: See above   Education / Equipment: HEP Plan: Patient agrees to discharge.  Patient goals were partially met.  Patient is being discharged due to being pleased with the current functional level.  ?????      [place], [place], [address] Phone: [phone]   Fax:  [phone]  Name: Ann Sampson MRN: [id number] Date of Birth: [birth year]

## 2020-04-16 ENCOUNTER — Ambulatory Visit: Payer: Self-pay

## 2020-04-16 ENCOUNTER — Ambulatory Visit: Payer: 59

## 2020-04-16 ENCOUNTER — Other Ambulatory Visit: Payer: Self-pay

## 2020-04-16 ENCOUNTER — Ambulatory Visit: Payer: 59 | Admitting: Orthopedic Surgery

## 2020-04-16 ENCOUNTER — Encounter: Payer: Self-pay | Admitting: Orthopedic Surgery

## 2020-04-16 VITALS — Ht 65.0 in | Wt 195.0 lb

## 2020-04-16 DIAGNOSIS — M79672 Pain in left foot: Secondary | ICD-10-CM | POA: Diagnosis not present

## 2020-04-16 DIAGNOSIS — S93439D Sprain of tibiofibular ligament of unspecified ankle, subsequent encounter: Secondary | ICD-10-CM

## 2020-04-16 DIAGNOSIS — S93412A Sprain of calcaneofibular ligament of left ankle, initial encounter: Secondary | ICD-10-CM | POA: Diagnosis not present

## 2020-04-16 DIAGNOSIS — S93325S Dislocation of tarsometatarsal joint of left foot, sequela: Secondary | ICD-10-CM

## 2020-04-16 NOTE — Progress Notes (Signed)
Office Visit Note   Patient: Ann Sampson           Date of Birth: 08-30-1975           MRN: [id number] Visit Date: 04/16/2020              Requested by: Shade Flood, MD [address] [place],  [place] [address] PCP: Shade Flood, MD  Chief Complaint  Patient presents with  . Left Ankle - Follow-up    DOI 03/13/20 sprain calcaneofibular ligament       HPI: Patient is here in follow-up for her left  Assessment & Plan: Visit Diagnoses:  1. Sprain of calcaneofibular ligament of left ankle, initial encounter   2. Pain in left foot     Plan: Discussed with the patient with the displacement across the Lisfranc complex and persistent pain after 4 weeks of conservative therapy her best option is to proceed with open reduction internal fixation across the base of the first metatarsal medial cuneiform and the medial cuneiform to the second metatarsal.  Discussed that since she is still symptomatic in the syndesmosis that recommendations to proceed with internal fixation across the syndesmosis.  Risk and benefits were discussed including infection neurovascular injury persistent pain need for additional surgery patient states she understands wished to proceed at this time.  Follow-Up Instructions: No follow-ups on file.   Ortho Exam  Patient is alert, oriented, no adenopathy, well-dressed, normal affect, normal respiratory effort. Examination patient has good dorsalis pedis pulse she initially had significant ecchymosis and bruising across the syndesmosis the ecchymosis and bruising has resolved but patient is still extremely tender to palpation across the syndesmosis and compression across the syndesmosis reproduces pain.  Patient complains of progressive pain across the arch of her foot.  She has pain with distraction across the base of the first metatarsal pain to palpation at the base of the first metatarsal pain to palpation across the Lisfranc complex and pain with  distraction across the second metatarsal head.  Radiograph shows displacement across the Lisfranc complex.  Most likely patient's initial pain across the syndesmosis was greater than the pain across the Lisfranc complex and as the syndesmosis is becoming less painful she has been having progressively more pain across the Lisfranc complex.  Imaging: No results found. No images are attached to the encounter.  Labs: Lab Results  Component Value Date   ESRSEDRATE 5 10/07/2017   ESRSEDRATE 2 02/26/2016   CRP 5.5 (H) 10/07/2017   LABURIC 4.0 10/07/2017   LABURIC 4.8 08/18/2013     Lab Results  Component Value Date   ALBUMIN 4.4 10/07/2017   ALBUMIN 4.6 07/10/2014   ALBUMIN 4.8 08/18/2013   LABURIC 4.0 10/07/2017   LABURIC 4.8 08/18/2013    No results found for: MG Lab Results  Component Value Date   VD25OH 45.1 02/26/2016    No results found for: PREALBUMIN CBC EXTENDED Latest Ref Rng & Units 10/07/2017 09/25/2016 07/10/2014  WBC 3.4 - 10.8 x10E3/uL 5.2 6.8 7.0  RBC 3.77 - 5.28 x10E6/uL 4.61 4.75 5.04  HGB 11.1 - 15.9 g/dL 71.0 62.6 15.3(H)  HCT 34.0 - 46.6 % 42.3 43.6 45.1  PLT 150 - 379 x10E3/uL 267 - 285  NEUTROABS 1.40 - 7.00 x10E3/uL 2.8 - -  LYMPHSABS 0 - 3 x10E3/uL 2.1 - -     Body mass index is 32.45 kg/m.  Orders:  Orders Placed This Encounter  Procedures  . XR Ankle Complete Left  .  XR Foot Complete Left   No orders of the defined types were placed in this encounter.    Procedures: No procedures performed  Clinical Data: No additional findings.  ROS:  All other systems negative, except as noted in the HPI. Review of Systems  Objective: Vital Signs: Ht 5\' 5"  (1.651 m)   Wt 195 lb (88.5 kg)   BMI 32.45 kg/m   Specialty Comments:  No specialty comments available.  PMFS History: Patient Active Problem List   Diagnosis Date Noted  . Generalized anxiety disorder 11/03/2017  . Neuropathy 11/04/2016  . Paresthesias 11/04/2016   Past Medical  History:  Diagnosis Date  . Allergy   . Arthritis   . Asthma   . Depression     Family History  Problem Relation Age of Onset  . Skin cancer Mother   . Asthma Son   . Seizures Maternal Grandmother   . Cancer Maternal Grandmother   . Stroke Maternal Grandmother   . Neuropathy Maternal Grandfather   . Diabetic kidney disease Maternal Grandfather   . Diabetes Maternal Grandfather   . Heart disease Paternal Grandfather   . Hyperlipidemia Paternal Grandfather   . Hypertension Paternal Grandfather     Past Surgical History:  Procedure Laterality Date  . BREAST SURGERY    . KNEE SURGERY     Social History   Occupational History  . Not on file  Tobacco Use  . Smoking status: Former 01/04/2017  . Smokeless tobacco: Never Used  Substance and Sexual Activity  . Alcohol use: Yes    Alcohol/week: 1.0 standard drink    Types: 1 Glasses of wine per week    Comment: occcasionally  . Drug use: No  . Sexual activity: Not on file

## 2020-04-17 ENCOUNTER — Other Ambulatory Visit: Payer: Self-pay

## 2020-04-17 ENCOUNTER — Other Ambulatory Visit (HOSPITAL_COMMUNITY)
Admission: RE | Admit: 2020-04-17 | Discharge: 2020-04-17 | Disposition: A | Discharge status: Home / Self Care | Payer: 59 | Source: Ambulatory Visit | Attending: Orthopedic Surgery | Admitting: Orthopedic Surgery

## 2020-04-17 ENCOUNTER — Encounter (HOSPITAL_COMMUNITY): Payer: Self-pay | Admitting: Orthopedic Surgery

## 2020-04-17 ENCOUNTER — Other Ambulatory Visit: Payer: Self-pay | Admitting: Physician Assistant

## 2020-04-17 DIAGNOSIS — Z20822 Contact with and (suspected) exposure to covid-19: Secondary | ICD-10-CM | POA: Diagnosis present

## 2020-04-17 DIAGNOSIS — Z01812 Encounter for preprocedural laboratory examination: Secondary | ICD-10-CM | POA: Insufficient documentation

## 2020-04-17 LAB — SARS CORONAVIRUS 2 (TAT 6-24 HRS): SARS Coronavirus 2: NEGATIVE

## 2020-04-17 NOTE — Progress Notes (Addendum)
Mrs. Seldon denies chest pain or shortness of breath. Patient was tested negative today for Covid and has been in quarantine.  PCP is Dr. Chilton Si, Mrs. Horseman denies ever seeing a cardiologist.  Mrs. Ellefson husband came to the hospital to pick up PRE- Surgery Ensure.  I instructed patient earlier.

## 2020-04-18 ENCOUNTER — Ambulatory Visit (HOSPITAL_COMMUNITY): Payer: 59 | Admitting: Anesthesiology

## 2020-04-18 ENCOUNTER — Ambulatory Visit (HOSPITAL_COMMUNITY)
Admission: RE | Admit: 2020-04-18 | Discharge: 2020-04-18 | Disposition: A | Discharge status: Home / Self Care | Payer: 59 | Attending: Orthopedic Surgery | Admitting: Orthopedic Surgery

## 2020-04-18 ENCOUNTER — Encounter (HOSPITAL_COMMUNITY)
Admission: RE | Disposition: A | Discharge status: Home / Self Care | Payer: Self-pay | Source: Home / Self Care | Attending: Orthopedic Surgery

## 2020-04-18 ENCOUNTER — Ambulatory Visit (HOSPITAL_COMMUNITY): Payer: 59

## 2020-04-18 ENCOUNTER — Encounter (HOSPITAL_COMMUNITY): Payer: Self-pay | Admitting: Orthopedic Surgery

## 2020-04-18 DIAGNOSIS — S92242A Displaced fracture of medial cuneiform of left foot, initial encounter for closed fracture: Secondary | ICD-10-CM | POA: Insufficient documentation

## 2020-04-18 DIAGNOSIS — S93432S Sprain of tibiofibular ligament of left ankle, sequela: Secondary | ICD-10-CM | POA: Diagnosis not present

## 2020-04-18 DIAGNOSIS — S93412A Sprain of calcaneofibular ligament of left ankle, initial encounter: Secondary | ICD-10-CM | POA: Diagnosis present

## 2020-04-18 DIAGNOSIS — X58XXXA Exposure to other specified factors, initial encounter: Secondary | ICD-10-CM | POA: Diagnosis not present

## 2020-04-18 DIAGNOSIS — S93432A Sprain of tibiofibular ligament of left ankle, initial encounter: Secondary | ICD-10-CM | POA: Diagnosis not present

## 2020-04-18 DIAGNOSIS — Z87891 Personal history of nicotine dependence: Secondary | ICD-10-CM | POA: Insufficient documentation

## 2020-04-18 DIAGNOSIS — S93325S Dislocation of tarsometatarsal joint of left foot, sequela: Secondary | ICD-10-CM

## 2020-04-18 DIAGNOSIS — Z419 Encounter for procedure for purposes other than remedying health state, unspecified: Secondary | ICD-10-CM

## 2020-04-18 DIAGNOSIS — S93325A Dislocation of tarsometatarsal joint of left foot, initial encounter: Secondary | ICD-10-CM

## 2020-04-18 HISTORY — PX: ORIF ANKLE FRACTURE: SHX5408

## 2020-04-18 HISTORY — DX: Anxiety disorder, unspecified: F41.9

## 2020-04-18 HISTORY — DX: Other complications of anesthesia, initial encounter: T88.59XA

## 2020-04-18 HISTORY — DX: Headache, unspecified: R51.9

## 2020-04-18 HISTORY — DX: Other seasonal allergic rhinitis: J30.2

## 2020-04-18 HISTORY — DX: Polyneuropathy, unspecified: G62.9

## 2020-04-18 HISTORY — DX: Family history of other specified conditions: Z84.89

## 2020-04-18 LAB — CBC
HCT: 48.3 % — ABNORMAL HIGH (ref 36.0–46.0)
Hemoglobin: 16.2 g/dL — ABNORMAL HIGH (ref 12.0–15.0)
MCH: 31.9 pg (ref 26.0–34.0)
MCHC: 33.5 g/dL (ref 30.0–36.0)
MCV: 95.1 fL (ref 80.0–100.0)
Platelets: 287 10*3/uL (ref 150–400)
RBC: 5.08 MIL/uL (ref 3.87–5.11)
RDW: 12.1 % (ref 11.5–15.5)
WBC: 5.4 10*3/uL (ref 4.0–10.5)
nRBC: 0 % (ref 0.0–0.2)

## 2020-04-18 LAB — POCT PREGNANCY, URINE: Preg Test, Ur: NEGATIVE

## 2020-04-18 LAB — SURGICAL PCR SCREEN
MRSA, PCR: NEGATIVE
Staphylococcus aureus: POSITIVE — AB

## 2020-04-18 SURGERY — OPEN REDUCTION INTERNAL FIXATION (ORIF) ANKLE FRACTURE
Anesthesia: Regional | Site: Ankle | Laterality: Left

## 2020-04-18 MED ORDER — DEXAMETHASONE SODIUM PHOSPHATE 10 MG/ML IJ SOLN
INTRAMUSCULAR | Status: DC | PRN
  Administered 2020-04-18: 4 mg via INTRAVENOUS

## 2020-04-18 MED ORDER — ROPIVACAINE HCL 5 MG/ML IJ SOLN: INTRAMUSCULAR | Status: DC | PRN

## 2020-04-18 MED ORDER — LIDOCAINE HCL (CARDIAC) PF 100 MG/5ML IV SOSY
PREFILLED_SYRINGE | INTRAVENOUS | Status: DC | PRN
  Administered 2020-04-18: 80 mg via INTRAVENOUS

## 2020-04-18 MED ORDER — PROPOFOL 10 MG/ML IV BOLUS
INTRAVENOUS | Status: DC | PRN
  Administered 2020-04-18: 150 mg via INTRAVENOUS

## 2020-04-18 MED ORDER — SCOPOLAMINE 1 MG/3DAYS TD PT72
MEDICATED_PATCH | TRANSDERMAL | Status: AC
  Administered 2020-04-18: 1.5 mg via TRANSDERMAL
  Filled 2020-04-18: qty 1

## 2020-04-18 MED ORDER — MIDAZOLAM HCL 2 MG/2ML IJ SOLN
INTRAMUSCULAR | Status: DC | PRN
  Administered 2020-04-18: 2 mg via INTRAVENOUS

## 2020-04-18 MED ORDER — CHLORHEXIDINE GLUCONATE 0.12 % MT SOLN
OROMUCOSAL | Status: AC
  Administered 2020-04-18: 15 mL via OROMUCOSAL
  Filled 2020-04-18: qty 15

## 2020-04-18 MED ORDER — 0.9 % SODIUM CHLORIDE (POUR BTL) OPTIME
TOPICAL | Status: DC | PRN
  Administered 2020-04-18: 1000 mL

## 2020-04-18 MED ORDER — FENTANYL CITRATE (PF) 100 MCG/2ML IJ SOLN
INTRAMUSCULAR | Status: AC
  Administered 2020-04-18: 100 ug via INTRAVENOUS
  Filled 2020-04-18: qty 2

## 2020-04-18 MED ORDER — CEFAZOLIN SODIUM-DEXTROSE 2-4 GM/100ML-% IV SOLN
2.0000 g | INTRAVENOUS | Status: AC
  Administered 2020-04-18: 2 g via INTRAVENOUS
  Filled 2020-04-18: qty 100

## 2020-04-18 MED ORDER — ROPIVACAINE HCL 5 MG/ML IJ SOLN
INTRAMUSCULAR | Status: DC | PRN
  Administered 2020-04-18: 50 mL via PERINEURAL

## 2020-04-18 MED ORDER — PHENYLEPHRINE HCL (PRESSORS) 10 MG/ML IV SOLN
INTRAVENOUS | Status: DC | PRN
  Administered 2020-04-18: 80 ug via INTRAVENOUS
  Administered 2020-04-18 (×2): 120 ug via INTRAVENOUS

## 2020-04-18 MED ORDER — FENTANYL CITRATE (PF) 100 MCG/2ML IJ SOLN: 100.0000 ug | Freq: Once | INTRAMUSCULAR | Status: AC

## 2020-04-18 MED ORDER — CHLORHEXIDINE GLUCONATE 0.12 % MT SOLN: 15.0000 mL | Freq: Once | OROMUCOSAL | Status: AC

## 2020-04-18 MED ORDER — OXYCODONE-ACETAMINOPHEN 5-325 MG PO TABS: 1.0000 | ORAL_TABLET | ORAL | 0 refills | Status: DC | PRN

## 2020-04-18 MED ORDER — ONDANSETRON HCL 4 MG/2ML IJ SOLN
INTRAMUSCULAR | Status: AC
  Filled 2020-04-18: qty 2

## 2020-04-18 MED ORDER — LACTATED RINGERS IV SOLN: INTRAVENOUS | Status: DC

## 2020-04-18 MED ORDER — FENTANYL CITRATE (PF) 250 MCG/5ML IJ SOLN
INTRAMUSCULAR | Status: AC
  Filled 2020-04-18: qty 5

## 2020-04-18 MED ORDER — ORAL CARE MOUTH RINSE: 15.0000 mL | Freq: Once | OROMUCOSAL | Status: AC

## 2020-04-18 MED ORDER — ACETAMINOPHEN 500 MG PO TABS
ORAL_TABLET | ORAL | Status: AC
  Administered 2020-04-18: 1000 mg via ORAL
  Filled 2020-04-18: qty 2

## 2020-04-18 MED ORDER — ACETAMINOPHEN 500 MG PO TABS: 1000.0000 mg | ORAL_TABLET | Freq: Once | ORAL | Status: AC

## 2020-04-18 MED ORDER — PHENYLEPHRINE 40 MCG/ML (10ML) SYRINGE FOR IV PUSH (FOR BLOOD PRESSURE SUPPORT)
PREFILLED_SYRINGE | INTRAVENOUS | Status: AC
  Filled 2020-04-18: qty 10

## 2020-04-18 MED ORDER — ONDANSETRON HCL 4 MG/2ML IJ SOLN
INTRAMUSCULAR | Status: DC | PRN
  Administered 2020-04-18: 4 mg via INTRAVENOUS

## 2020-04-18 MED ORDER — DEXAMETHASONE SODIUM PHOSPHATE 10 MG/ML IJ SOLN
INTRAMUSCULAR | Status: AC
  Filled 2020-04-18: qty 1

## 2020-04-18 MED ORDER — FENTANYL CITRATE (PF) 100 MCG/2ML IJ SOLN
INTRAMUSCULAR | Status: DC | PRN
  Administered 2020-04-18: 25 ug via INTRAVENOUS

## 2020-04-18 MED ORDER — MIDAZOLAM HCL 2 MG/2ML IJ SOLN: 2.0000 mg | Freq: Once | INTRAMUSCULAR | Status: AC

## 2020-04-18 MED ORDER — MIDAZOLAM HCL 2 MG/2ML IJ SOLN
INTRAMUSCULAR | Status: AC
  Administered 2020-04-18: 2 mg via INTRAVENOUS
  Filled 2020-04-18: qty 2

## 2020-04-18 MED ORDER — MIDAZOLAM HCL 2 MG/2ML IJ SOLN
INTRAMUSCULAR | Status: AC
  Filled 2020-04-18: qty 2

## 2020-04-18 MED ORDER — SCOPOLAMINE 1 MG/3DAYS TD PT72: 1.0000 | MEDICATED_PATCH | TRANSDERMAL | Status: DC

## 2020-04-18 MED ORDER — PROPOFOL 10 MG/ML IV BOLUS
INTRAVENOUS | Status: AC
  Filled 2020-04-18: qty 20

## 2020-04-18 MED ORDER — ASPIRIN EC 81 MG PO TBEC: 81.0000 mg | DELAYED_RELEASE_TABLET | Freq: Every day | ORAL | 2 refills | Status: AC

## 2020-04-18 SURGICAL SUPPLY — 34 items
BIT DRILL 110X2.5XQCK CNCT (BIT) ×1 IMPLANT
BIT DRILL 2.5 (BIT) ×3
BIT DRILL CANNULTD 2.6 X 130MM (DRILL) IMPLANT
BIT DRL 110X2.5XQCK CNCT (BIT) ×1
BNDG COHESIVE 4X5 TAN STRL (GAUZE/BANDAGES/DRESSINGS) ×2 IMPLANT
BNDG GAUZE ELAST 4 BULKY (GAUZE/BANDAGES/DRESSINGS) ×3 IMPLANT
COVER SURGICAL LIGHT HANDLE (MISCELLANEOUS) ×3 IMPLANT
DRAPE U-SHAPE 47X51 STRL (DRAPES) ×3 IMPLANT
DRILL CANNULATED 2.6 X 130MM (DRILL) ×3
DRSG ADAPTIC 3X8 NADH LF (GAUZE/BANDAGES/DRESSINGS) ×2 IMPLANT
DURAPREP 26ML APPLICATOR (WOUND CARE) ×3 IMPLANT
ELECT REM PT RETURN 9FT ADLT (ELECTROSURGICAL) ×3
ELECTRODE REM PT RTRN 9FT ADLT (ELECTROSURGICAL) ×1 IMPLANT
GLOVE BIOGEL PI IND STRL 9 (GLOVE) ×1 IMPLANT
GLOVE BIOGEL PI INDICATOR 9 (GLOVE) ×2
GLOVE SURG ORTHO 9.0 STRL STRW (GLOVE) ×3 IMPLANT
GOWN STRL REUS W/ TWL XL LVL3 (GOWN DISPOSABLE) ×3 IMPLANT
GOWN STRL REUS W/TWL XL LVL3 (GOWN DISPOSABLE) ×9
IMPL SCREW 4.0X50 CANC FT (Screw) IMPLANT
IMPLANT SCREW 4.0X50 CANC FT (Screw) ×6 IMPLANT
K-WIRE SNGL END 1.2X150 (MISCELLANEOUS) ×9
KIT BASIN OR (CUSTOM PROCEDURE TRAY) ×3 IMPLANT
KIT TURNOVER KIT B (KITS) ×3 IMPLANT
KWIRE SNGL END 1.2X150 (MISCELLANEOUS) IMPLANT
MANIFOLD NEPTUNE II (INSTRUMENTS) ×3 IMPLANT
NS IRRIG 1000ML POUR BTL (IV SOLUTION) ×3 IMPLANT
PACK ORTHO EXTREMITY (CUSTOM PROCEDURE TRAY) ×3 IMPLANT
PAD ABD 7.5X8 STRL (GAUZE/BANDAGES/DRESSINGS) ×3 IMPLANT
PLATE BONE DUAL COMP 25 2H (Plate) ×2 IMPLANT
SCREW CANN HDLS LT 4.0X40 (Screw) ×4 IMPLANT
TOWEL GREEN STERILE (TOWEL DISPOSABLE) ×3 IMPLANT
TOWEL GREEN STERILE FF (TOWEL DISPOSABLE) ×3 IMPLANT
TUBE CONNECTING 12'X1/4 (SUCTIONS) ×1
TUBE CONNECTING 12X1/4 (SUCTIONS) ×2 IMPLANT

## 2020-04-18 NOTE — Anesthesia Procedure Notes (Signed)
Procedure Name: LMA Insertion Date/Time: 04/18/2020 2:07 PM Performed by: Epifanio Lesches, CRNA Pre-anesthesia Checklist: Patient identified, Emergency Drugs available, Suction available and Patient being monitored Patient Re-evaluated:Patient Re-evaluated prior to induction Oxygen Delivery Method: Circle System Utilized Preoxygenation: Pre-oxygenation with 100% oxygen Induction Type: IV induction Ventilation: Mask ventilation without difficulty LMA: LMA inserted LMA Size: 4.0 Number of attempts: 1 Airway Equipment and Method: Bite block Placement Confirmation: positive ETCO2 Tube secured with: Tape Dental Injury: Teeth and Oropharynx as per pre-operative assessment

## 2020-04-18 NOTE — H&P (Signed)
Ann Sampson is an 44 y.o. female.   Chief Complaint: Left ankle and foot pain HPI: Patient is here in follow-up for her left foot and Ankle . She is status post ankle sprain injury but also complains of pain in the arch of her foot  Past Medical History:  Diagnosis Date  . Anxiety   . Arthritis   . Asthma   . Complication of anesthesia    with endoscopic spoke profeaty  . Depression   . Family history of adverse reaction to anesthesia    sister- vommitting  . Headache    magraines in the past  . Neuropathy    feet mainly- hands some  . Seasonal allergies     Past Surgical History:  Procedure Laterality Date  . BREAST SURGERY Bilateral    Breast Enhancement  . ESOPHAGOGASTRODUODENOSCOPY     x 3  last one approx 2002  . KNEE SURGERY Right    arthroscopy- cartilage    Family History  Problem Relation Age of Onset  . Skin cancer Mother   . Asthma Son   . Seizures Maternal Grandmother   . Cancer Maternal Grandmother   . Stroke Maternal Grandmother   . Neuropathy Maternal Grandfather   . Diabetic kidney disease Maternal Grandfather   . Diabetes Maternal Grandfather   . Heart disease Paternal Grandfather   . Hyperlipidemia Paternal Grandfather   . Hypertension Paternal Grandfather    Social History:  reports that she has quit smoking. She has never used smokeless tobacco. She reports current alcohol use of about 1.0 standard drink of alcohol per week. She reports that she does not use drugs.  Allergies:  Allergies  Allergen Reactions  . Bee Venom Anaphylaxis  . Hornet Venom Anaphylaxis  . Latex Hives and Shortness Of Breath  . Prednisone Anaphylaxis and Rash  . Chap-Aid [Lip Balm] Swelling    Swelling of lips  . Bextra [Valdecoxib] Rash  . Flagyl [Metronidazole] Rash  . Vioxx [Rofecoxib] Rash    No medications prior to admission.    Results for orders placed or performed during the hospital encounter of 04/17/20 (from the past 48 hour(s))  SARS  CORONAVIRUS 2 (TAT 6-24 HRS) Nasopharyngeal Nasopharyngeal Swab     Status: None   Collection Time: 04/17/20  8:32 AM   Specimen: Nasopharyngeal Swab  Result Value Ref Range   SARS Coronavirus 2 NEGATIVE NEGATIVE    Comment: (NOTE) SARS-CoV-2 target nucleic acids are NOT DETECTED.  The SARS-CoV-2 RNA is generally detectable in upper and lower respiratory specimens during the acute phase of infection. Negative results do not preclude SARS-CoV-2 infection, do not rule out co-infections with other pathogens, and should not be used as the sole basis for treatment or other patient management decisions. Negative results must be combined with clinical observations, patient history, and epidemiological information. The expected result is Negative.  Fact Sheet for Patients: HairSlick.no  Fact Sheet for Healthcare Providers: quierodirigir.com  This test is not yet approved or cleared by the [place] FDA and  has been authorized for detection and/or diagnosis of SARS-CoV-2 by FDA under an Emergency Use Authorization (EUA). This EUA will remain  in effect (meaning this test can be used) for the duration of the COVID-19 declaration under Se ction 564(b)(1) of the Act, 21 U.S.C. section 360bbb-3(b)(1), unless the authorization is terminated or revoked sooner.  Performed at [facility], [address] [place], [place] [address]    XR Ankle Complete Left  Result  Date: 04/16/2020 2 view radiographs of the left ankle shows no widening of the syndesmosis the joint is congruent  XR Foot Complete Left  Result Date: 04/16/2020 2 view radiographs of the left foot shows displacement across the Lisfranc complex with lateral translation of the second metatarsal.  XR Foot 2 Views Left  Result Date: 04/16/2020 2 view radiographs of the left foot shows widening of the Lisfranc complex with lateral translation of the second  metatarsal   Review of Systems  All other systems reviewed and are negative.   Last menstrual period 03/25/2020. Physical Exam  Patient is alert, oriented, no adenopathy, well-dressed, normal affect, normal respiratory effort. Examination patient has good dorsalis pedis pulse she initially had significant ecchymosis and bruising across the syndesmosis the ecchymosis and bruising has resolved but patient is still extremely tender to palpation across the syndesmosis and compression across the syndesmosis reproduces pain.  Patient complains of progressive pain across the arch of her foot.  She has pain with distraction across the base of the first metatarsal pain to palpation at the base of the first metatarsal pain to palpation across the Lisfranc complex and pain with distraction across the second metatarsal head.  Radiograph shows displacement across the Lisfranc complex.  Most likely patient's initial pain across the syndesmosis was greater than the pain across the Lisfranc complex and as the syndesmosis is becoming less painful she has been having progressively more pain across the Lisfranc complex. Heart RRR Lungs Clear Assessment/Plan 1. Sprain of calcaneofibular ligament of left ankle, initial encounter   2. Pain in left foot     Plan: Discussed with the patient with the displacement across the Lisfranc complex and persistent pain after 4 weeks of conservative therapy her best option is to proceed with open reduction internal fixation across the base of the first metatarsal medial cuneiform and the medial cuneiform to the second metatarsal.  Discussed that since she is still symptomatic in the syndesmosis that recommendations to proceed with internal fixation across the syndesmosis.  Risk and benefits were discussed including infection neurovascular injury persistent pain need for additional surgery patient states she understands wished to proceed at this time.   [place] Alexarae Oliva,  PA 04/18/2020, 6:37 AM

## 2020-04-18 NOTE — Anesthesia Postprocedure Evaluation (Signed)
Anesthesia Post Note  Patient: Ann Sampson  Procedure(s) Performed: OPEN REDUCTION INTERNAL FIXATION (ORIF) SYNDESMOSIS LEFT ANKLE AND LISFRANC JOINT LEFT FOOT (Left Ankle)     Patient location during evaluation: PACU Anesthesia Type: Regional and General Level of consciousness: sedated Pain management: pain level controlled Vital Signs Assessment: post-procedure vital signs reviewed and stable Respiratory status: spontaneous breathing and respiratory function stable Cardiovascular status: stable Postop Assessment: no apparent nausea or vomiting Anesthetic complications: no   No complications documented.  Last Vitals:  Vitals:   04/18/20 1518 04/18/20 1533  BP: (!) 138/95 132/84  Pulse: 95 99  Resp: 16 18  Temp:  36.4 C  SpO2: 100% 99%    Last Pain:  Vitals:   04/18/20 1533  TempSrc:   PainSc: 0-No pain                 Mellody Dance

## 2020-04-18 NOTE — Anesthesia Preprocedure Evaluation (Signed)
Anesthesia Evaluation  Patient identified by MRN, date of birth, ID band  Airway Mallampati: II  TM Distance: >3 FB Neck ROM: Full    Dental no notable dental hx.    Pulmonary asthma , former smoker,    Pulmonary exam normal breath sounds clear to auscultation       Cardiovascular Exercise Tolerance: Good negative cardio ROS Normal cardiovascular exam Rhythm:Regular Rate:Normal     Neuro/Psych  Headaches, PSYCHIATRIC DISORDERS Anxiety Depression    GI/Hepatic negative GI ROS, Neg liver ROS,   Endo/Other  negative endocrine ROS  Renal/GU negative Renal ROS  negative genitourinary   Musculoskeletal  (+) Arthritis ,   Abdominal Normal abdominal exam  (+)   Peds negative pediatric ROS (+)  Hematology negative hematology ROS (+)   Anesthesia Other Findings   Reproductive/Obstetrics negative OB ROS                             Anesthesia Physical Anesthesia Plan  ASA: II  Anesthesia Plan: Regional and General   Post-op Pain Management: GA combined w/ Regional for post-op pain   Induction:   PONV Risk Score and Plan: Scopolamine patch - Pre-op, Dexamethasone, Ondansetron, Midazolam and Treatment may vary due to age or medical condition  Airway Management Planned: LMA  Additional Equipment:   Intra-op Plan:   Post-operative Plan: Extubation in OR  Informed Consent: I have reviewed the patients History and Physical, chart, labs and discussed the procedure including the risks, benefits and alternatives for the proposed anesthesia with the patient or authorized representative who has indicated his/her understanding and acceptance.       Plan Discussed with: CRNA and Surgeon  Anesthesia Plan Comments: (Pop/saph block for postop pain control. GA/LMA. )        Anesthesia Quick Evaluation

## 2020-04-18 NOTE — Transfer of Care (Signed)
Immediate Anesthesia Transfer of Care Note  Patient: Ann Sampson  Procedure(s) Performed: OPEN REDUCTION INTERNAL FIXATION (ORIF) SYNDESMOSIS LEFT ANKLE AND LISFRANC JOINT LEFT FOOT (Left Ankle)  Patient Location: PACU  Anesthesia Type:GA combined with regional for post-op pain  Level of Consciousness: awake, alert  and oriented  Airway & Oxygen Therapy: Patient Spontanous Breathing and Patient connected to nasal cannula oxygen  Post-op Assessment: Report given to RN and Post -op Vital signs reviewed and stable  Post vital signs: Reviewed and stable  Last Vitals:  Vitals Value Taken Time  BP 133/95 04/18/20 1503  Temp    Pulse    Resp 24 04/18/20 1505  SpO2    Vitals shown include unvalidated device data.  Last Pain:  Vitals:   04/18/20 1147  TempSrc:   PainSc: 0-No pain         Complications: No complications documented.

## 2020-04-18 NOTE — Op Note (Signed)
04/18/2020  3:07 PM  PATIENT:  Ann Sampson    PRE-OPERATIVE DIAGNOSIS:  Left syndesmosis rupture, Left lisfranc dislocation  POST-OPERATIVE DIAGNOSIS:  Same  PROCEDURE:  OPEN REDUCTION INTERNAL FIXATION (ORIF) SYNDESMOSIS LEFT ANKLE AND LISFRANC JOINT LEFT FOOT C-arm fluoroscopy to verify reduction of both injuries  SURGEON:  Nadara Mustard, MD  PHYSICIAN ASSISTANT:None ANESTHESIA:   General  PREOPERATIVE INDICATIONS:  [name] is a  44 y.o. female with a diagnosis of Left syndesmosis rupture, Left lisfranc dislocation who failed conservative measures and elected for surgical management.    The risks benefits and alternatives were discussed with the patient preoperatively including but not limited to the risks of infection, bleeding, nerve injury, cardiopulmonary complications, the need for revision surgery, among others, and the patient was willing to proceed.  OPERATIVE IMPLANTS: 3.5 cortical screws plus a 2 hole plate to stabilize the syndesmosis injury to headless cannulated screws to stabilize the Lisfranc dislocation.  @ENCIMAGES @  OPERATIVE FINDINGS: There was excess scar tissue around the base of the first metatarsal and the Lisfranc complex consistent with an unstable dislocation.  C-arm fluoroscopy verified reduction after internal fixation.  C-arm fluoroscopy verified reduction of the mortise after syndesmotic screw placement.  OPERATIVE PROCEDURE: Patient was brought the operating room and underwent a general anesthetic.  After adequate levels anesthesia were obtained patient's left lower extremity was prepped using DuraPrep draped into a sterile field a timeout was called.  A lateral incision was made over the distal fibula this was carried down to bone a 2 hole one third tubular plate was placed and drill holes were made from the fibula into the tibia to stabilize the syndesmosis.  2 screws were placed both 45 mm in length.  C-arm fluoroscopy verified a  congruent mortise after stabilization.  A separate incision was then made dorsally over the first webspace this was carried down through the interval of the EHL and the anterior tibial tendon.  There is a significant amount of scar tissue over the base of the first metatarsal the scar tissue was debrided and the joint was unstable.  The joint was reduced and a guidewire was placed from the first metatarsal into the medial cuneiform C-arm fluoroscopy verified alignment in both AP and lateral planes a second guidewire was placed for the medial cuneiform across the Lisfranc complex into the second metatarsal.  C-arm possibly verified alignment and 2 compression screws headless were placed to stabilize the base of the first metatarsal and the Lisfranc complex.  C-arm fluoroscopy verified reduction.  The wounds were irrigated normal saline and the incision closed using 2-0 nylon a sterile dressing was applied patient was taken the PACU in stable condition  DISCHARGE PLANNING:  Antibiotic duration: Preoperative antibiotics  Weightbearing: Nonweightbearing on the left  Pain medication: Prescription called in for Percocet  Dressing care/ Wound VAC: In 1 week to change the dressing  Ambulatory devices: Crutches  Discharge to: Home.  Follow-up: In the office 1 week post operative.

## 2020-04-18 NOTE — Anesthesia Procedure Notes (Addendum)
Anesthesia Regional Block: Popliteal block   Pre-Anesthetic Checklist: ,, timeout performed, Correct Patient, Correct Site, Correct Laterality, Correct Procedure, Correct Position, site marked, Risks and benefits discussed,  Surgical consent,  Pre-op evaluation,  At surgeon's request and post-op pain management  Laterality: Left  Prep: chloraprep       Needles:  Injection technique: Single-shot  Needle Type: Echogenic Stimulator Needle     Needle Length: 10cm  Needle Gauge: 20     Additional Needles:   Procedures:,,,, ultrasound used (permanent image in chart),,,,  Narrative:  Start time: 04/18/2020 12:15 PM End time: 04/18/2020 12:20 PM Injection made incrementally with aspirations every 5 mL.  Performed by: Personally  Anesthesiologist: Mellody Dance, MD  Additional Notes: A functioning IV was confirmed and monitors were applied.  Sterile prep and drape, hand hygiene and sterile gloves were used.  Negative aspiration and test dose prior to incremental administration of local anesthetic. The patient tolerated the procedure well.Ultrasound  guidance: relevant anatomy identified, needle position confirmed, local anesthetic spread visualized around nerve(s), vascular puncture avoided.  Image printed for medical record.

## 2020-04-19 ENCOUNTER — Encounter (HOSPITAL_COMMUNITY): Payer: Self-pay | Admitting: Orthopedic Surgery

## 2020-04-26 ENCOUNTER — Encounter: Payer: Self-pay | Admitting: Orthopedic Surgery

## 2020-04-26 ENCOUNTER — Ambulatory Visit (INDEPENDENT_AMBULATORY_CARE_PROVIDER_SITE_OTHER): Payer: 59 | Admitting: Orthopedic Surgery

## 2020-04-26 VITALS — Ht 65.0 in | Wt 195.0 lb

## 2020-04-26 DIAGNOSIS — S93432D Sprain of tibiofibular ligament of left ankle, subsequent encounter: Secondary | ICD-10-CM

## 2020-04-26 DIAGNOSIS — S93325S Dislocation of tarsometatarsal joint of left foot, sequela: Secondary | ICD-10-CM

## 2020-04-26 DIAGNOSIS — S93439D Sprain of tibiofibular ligament of unspecified ankle, subsequent encounter: Secondary | ICD-10-CM

## 2020-05-03 ENCOUNTER — Telehealth: Payer: Self-pay | Admitting: Orthopedic Surgery

## 2020-05-03 ENCOUNTER — Encounter: Payer: Self-pay | Admitting: Physician Assistant

## 2020-05-03 ENCOUNTER — Ambulatory Visit: Payer: Self-pay

## 2020-05-03 ENCOUNTER — Ambulatory Visit (INDEPENDENT_AMBULATORY_CARE_PROVIDER_SITE_OTHER): Payer: 59 | Admitting: Physician Assistant

## 2020-05-03 DIAGNOSIS — M79672 Pain in left foot: Secondary | ICD-10-CM | POA: Diagnosis not present

## 2020-05-03 NOTE — Progress Notes (Signed)
Office Visit Note   Patient: Ann Sampson           Date of Birth: 06/26/1976           MRN: [id number] Visit Date: 05/03/2020              Requested by: Shade Flood, MD [address] [place],  [place] [address] PCP: Shade Flood, MD  Chief Complaint  Patient presents with  . Left Ankle - Routine Post Op  . Left Foot - Routine Post Op      HPI: Patient presents today 2 weeks status post ORIF left ankle syndesmosis and Lisfranc injury.  She still having some pain but only takes oxycodone at night.  She also takes Tylenol during the day as needed.  Denies any fever chills or calf pain  Assessment & Plan: Visit Diagnoses:  1. Pain in left foot     Plan: Sutures will be harvested today she will follow-up in 2 weeks at which time x-rays should be taken of her foot and ankle.  Continue working on dorsiflexion of her ankle  Follow-Up Instructions: No follow-ups on file.   Ortho Exam  Patient is alert, oriented, no adenopathy, well-dressed, normal affect, normal respiratory effort. Well-healed surgical incisions.  Swelling is well controlled without any cellulitis.  Compartments are soft and compressible.  She has some hypersensitivity over the dorsal incision  Imaging: No results found. No images are attached to the encounter.  Labs: Lab Results  Component Value Date   ESRSEDRATE 5 10/07/2017   ESRSEDRATE 2 02/26/2016   CRP 5.5 (H) 10/07/2017   LABURIC 4.0 10/07/2017   LABURIC 4.8 08/18/2013     Lab Results  Component Value Date   ALBUMIN 4.4 10/07/2017   ALBUMIN 4.6 07/10/2014   ALBUMIN 4.8 08/18/2013   LABURIC 4.0 10/07/2017   LABURIC 4.8 08/18/2013    No results found for: MG Lab Results  Component Value Date   VD25OH 45.1 02/26/2016    No results found for: PREALBUMIN CBC EXTENDED Latest Ref Rng & Units 04/18/2020 10/07/2017 09/25/2016  WBC 4.0 - 10.5 K/uL 5.4 5.2 6.8  RBC 3.87 - 5.11 MIL/uL 5.08 4.61 4.75  HGB 12.0 - 15.0 g/dL 16.2(H)  14.4 15.3  HCT 36 - 46 % 48.3(H) 42.3 43.6  PLT 150 - 400 K/uL 287 267 -  NEUTROABS 1.40 - 7.00 x10E3/uL - 2.8 -  LYMPHSABS 0 - 3 x10E3/uL - 2.1 -     There is no height or weight on file to calculate BMI.  Orders:  Orders Placed This Encounter  Procedures  . XR Ankle Complete Left  . XR Foot 2 Views Left   No orders of the defined types were placed in this encounter.    Procedures: No procedures performed  Clinical Data: No additional findings.  ROS:  All other systems negative, except as noted in the HPI. Review of Systems  Objective: Vital Signs: There were no vitals taken for this visit.  Specialty Comments:  No specialty comments available.  PMFS History: Patient Active Problem List   Diagnosis Date Noted  . Lisfranc dislocation, left, sequela   . Ankle syndesmosis disruption, left, sequela   . Generalized anxiety disorder 11/03/2017  . Neuropathy 11/04/2016  . Paresthesias 11/04/2016   Past Medical History:  Diagnosis Date  . Anxiety   . Arthritis   . Asthma   . Complication of anesthesia    with endoscopic spoke profeaty  . Depression   .  Family history of adverse reaction to anesthesia    sister- vommitting  . Headache    magraines in the past  . Neuropathy    feet mainly- hands some  . Seasonal allergies     Family History  Problem Relation Age of Onset  . Skin cancer Mother   . Asthma Son   . Seizures Maternal Grandmother   . Cancer Maternal Grandmother   . Stroke Maternal Grandmother   . Neuropathy Maternal Grandfather   . Diabetic kidney disease Maternal Grandfather   . Diabetes Maternal Grandfather   . Heart disease Paternal Grandfather   . Hyperlipidemia Paternal Grandfather   . Hypertension Paternal Grandfather     Past Surgical History:  Procedure Laterality Date  . BREAST SURGERY Bilateral    Breast Enhancement  . ESOPHAGOGASTRODUODENOSCOPY     x 3  last one approx 2002  . KNEE SURGERY Right    arthroscopy- cartilage    . ORIF ANKLE FRACTURE Left 04/18/2020   Procedure: OPEN REDUCTION INTERNAL FIXATION (ORIF) SYNDESMOSIS LEFT ANKLE AND LISFRANC JOINT LEFT FOOT;  Surgeon: Nadara Mustard, MD;  Location: [facility] OR;  Service: Orthopedics;  Laterality: Left;   Social History   Occupational History  . Not on file  Tobacco Use  . Smoking status: Former Games developer  . Smokeless tobacco: Never Used  Substance and Sexual Activity  . Alcohol use: Yes    Alcohol/week: 1.0 standard drink    Types: 1 Glasses of wine per week    Comment: occcasionally  . Drug use: No  . Sexual activity: Not on file

## 2020-05-03 NOTE — Telephone Encounter (Signed)
Forms received fom [employer]. Sent to [employer].

## 2020-05-04 ENCOUNTER — Other Ambulatory Visit: Payer: Self-pay

## 2020-05-04 ENCOUNTER — Telehealth: Payer: Self-pay | Admitting: Orthopedic Surgery

## 2020-05-04 ENCOUNTER — Telehealth: Payer: 59 | Admitting: Family Medicine

## 2020-05-04 NOTE — Telephone Encounter (Signed)
Patient called. She says she is waiting to get up because of her pain so she hasn't been peeing like she should. She thinks she has a urinary trac infection. I did tell her to call her PCP.

## 2020-05-04 NOTE — Telephone Encounter (Signed)
Was this an FYI or was the pt still wanting a call back? Yes, she should call her PCP

## 2020-05-10 ENCOUNTER — Encounter: Payer: Self-pay | Admitting: Orthopedic Surgery

## 2020-05-10 NOTE — Progress Notes (Signed)
Office Visit Note   Patient: Ann Sampson           Date of Birth: 1975/08/03           MRN: [id number] Visit Date: 04/26/2020              Requested by: Shade Flood, MD [address] [place],  [place] [address] PCP: Shade Flood, MD  Chief Complaint  Patient presents with  . Left Ankle - Routine Post Op    04/18/20 ORIF left ankle and  Lisfranc joint       HPI: Patient is a 44 year old woman who is seen 1 week status post open reduction internal fixation Lisfranc fracture dislocation as well as internal fixation for syndesmosis injury.  Assessment & Plan: Visit Diagnoses:  1. Syndesmotic ankle sprain, unspecified laterality, subsequent encounter   2. Lisfranc dislocation, left, sequela     Plan: Patient was given instructions on dorsiflexion exercises she will continue with a kneeling scooter in the fracture boot we will plan to set her up for physical therapy and start compression stockings after the sutures are removed.  Follow-Up Instructions: Return in about 1 week (around 05/03/2020).   Ortho Exam  Patient is alert, oriented, no adenopathy, well-dressed, normal affect, normal respiratory effort. Examination incision is clean and dry, there is no redness no cellulitis no drainage no signs of infection.  Imaging: No results found. No images are attached to the encounter.  Labs: Lab Results  Component Value Date   ESRSEDRATE 5 10/07/2017   ESRSEDRATE 2 02/26/2016   CRP 5.5 (H) 10/07/2017   LABURIC 4.0 10/07/2017   LABURIC 4.8 08/18/2013     Lab Results  Component Value Date   ALBUMIN 4.4 10/07/2017   ALBUMIN 4.6 07/10/2014   ALBUMIN 4.8 08/18/2013   LABURIC 4.0 10/07/2017   LABURIC 4.8 08/18/2013    No results found for: MG Lab Results  Component Value Date   VD25OH 45.1 02/26/2016    No results found for: PREALBUMIN CBC EXTENDED Latest Ref Rng & Units 04/18/2020 10/07/2017 09/25/2016  WBC 4.0 - 10.5 K/uL 5.4 5.2 6.8  RBC 3.87 -  5.11 MIL/uL 5.08 4.61 4.75  HGB 12.0 - 15.0 g/dL 16.2(H) 14.4 15.3  HCT 36 - 46 % 48.3(H) 42.3 43.6  PLT 150 - 400 K/uL 287 267 -  NEUTROABS 1.40 - 7.00 x10E3/uL - 2.8 -  LYMPHSABS 0 - 3 x10E3/uL - 2.1 -     Body mass index is 32.45 kg/m.  Orders:  No orders of the defined types were placed in this encounter.  No orders of the defined types were placed in this encounter.    Procedures: No procedures performed  Clinical Data: No additional findings.  ROS:  All other systems negative, except as noted in the HPI. Review of Systems  Objective: Vital Signs: Ht 5\' 5"  (1.651 m)   Wt 195 lb (88.5 kg)   BMI 32.45 kg/m   Specialty Comments:  No specialty comments available.  PMFS History: Patient Active Problem List   Diagnosis Date Noted  . Lisfranc dislocation, left, sequela   . Ankle syndesmosis disruption, left, sequela   . Generalized anxiety disorder 11/03/2017  . Neuropathy 11/04/2016  . Paresthesias 11/04/2016   Past Medical History:  Diagnosis Date  . Anxiety   . Arthritis   . Asthma   . Complication of anesthesia    with endoscopic spoke profeaty  . Depression   . Family history of adverse reaction  to anesthesia    sister- vommitting  . Headache    magraines in the past  . Neuropathy    feet mainly- hands some  . Seasonal allergies     Family History  Problem Relation Age of Onset  . Skin cancer Mother   . Asthma Son   . Seizures Maternal Grandmother   . Cancer Maternal Grandmother   . Stroke Maternal Grandmother   . Neuropathy Maternal Grandfather   . Diabetic kidney disease Maternal Grandfather   . Diabetes Maternal Grandfather   . Heart disease Paternal Grandfather   . Hyperlipidemia Paternal Grandfather   . Hypertension Paternal Grandfather     Past Surgical History:  Procedure Laterality Date  . BREAST SURGERY Bilateral    Breast Enhancement  . ESOPHAGOGASTRODUODENOSCOPY     x 3  last one approx 2002  . KNEE SURGERY Right     arthroscopy- cartilage  . ORIF ANKLE FRACTURE Left 04/18/2020   Procedure: OPEN REDUCTION INTERNAL FIXATION (ORIF) SYNDESMOSIS LEFT ANKLE AND LISFRANC JOINT LEFT FOOT;  Surgeon: Nadara Mustard, MD;  Location: [facility] OR;  Service: Orthopedics;  Laterality: Left;   Social History   Occupational History  . Not on file  Tobacco Use  . Smoking status: Former Games developer  . Smokeless tobacco: Never Used  Substance and Sexual Activity  . Alcohol use: Yes    Alcohol/week: 1.0 standard drink    Types: 1 Glasses of wine per week    Comment: occcasionally  . Drug use: No  . Sexual activity: Not on file

## 2020-05-11 ENCOUNTER — Telehealth: Payer: Self-pay | Admitting: Physician Assistant

## 2020-05-11 NOTE — Telephone Encounter (Signed)
Patient called. She would like a note to work from home until she is better. Her call back number is [phone]

## 2020-05-16 NOTE — Telephone Encounter (Signed)
Called pt no answer. I will hold has an appt in the office tomorrow.

## 2020-05-17 ENCOUNTER — Ambulatory Visit (INDEPENDENT_AMBULATORY_CARE_PROVIDER_SITE_OTHER): Payer: 59 | Admitting: Physician Assistant

## 2020-05-17 ENCOUNTER — Encounter: Payer: Self-pay | Admitting: Physician Assistant

## 2020-05-17 ENCOUNTER — Ambulatory Visit: Payer: Self-pay

## 2020-05-17 DIAGNOSIS — M25572 Pain in left ankle and joints of left foot: Secondary | ICD-10-CM

## 2020-05-17 DIAGNOSIS — M79672 Pain in left foot: Secondary | ICD-10-CM

## 2020-05-17 NOTE — Telephone Encounter (Signed)
Will address while here today pt has arrived for appt.

## 2020-05-17 NOTE — Progress Notes (Signed)
Office Visit Note   Patient: Ann Sampson           Date of Birth: [birth year]           MRN: [id number] Visit Date: 05/17/2020              Requested by: Shade Flood, MD [address] [place],  [place] [address] PCP: Shade Flood, MD  Chief Complaint  Patient presents with  . Left Ankle - Pain  . Left Foot - Pain      HPI: The patient is 4 weeks status post open reduction internal fixation of left ankle syndesmosis injury and left foot Lisfranc injury she has been nonweightbearing but working on range of motion.  She denies fever, chills, or calf pain.  Her only complaint is of a rash that happened earlier this week on her left lower extremity.  At first it was pruritic but now it is not so.  She denies any rash in any other part of her body  Assessment & Plan: Visit Diagnoses:  1. Pain in left foot   2. Pain in left ankle and joints of left foot     Plan: Findings rash consistent with either folliculitis or more likely contact dermatitis.  No fine of an infective process.  Treat this symptomatically if it does not improve she should contact us.  She will begin physical therapy weightbearing as tolerated in her boot.  Work on range of motion of taught her some exercises to start on her own.  Follow-up in 3 weeks  Follow-Up Instructions: No follow-ups on file.   Ortho Exam  Patient is alert, oriented, no adenopathy, well-dressed, normal affect, normal respiratory effort. Left foot mild soft tissue swelling.  Pulses are palpable well-healed surgical incisions she does have a papular rash that runs mostly from her ankle up to her upper leg.  There is no associated drainage or redness.  Not pruritic at this time.  She has a very weak internal rotation she cannot fire her peroneals at this point.  Mildly hypersensitive over the dorsum of the foot.  No signs of any cellulitis or infection  Imaging: No results found. No images are attached to the  encounter.  Labs: Lab Results  Component Value Date   ESRSEDRATE 5 10/07/2017   ESRSEDRATE 2 02/26/2016   CRP 5.5 (H) 10/07/2017   LABURIC 4.0 10/07/2017   LABURIC 4.8 08/18/2013     Lab Results  Component Value Date   ALBUMIN 4.4 10/07/2017   ALBUMIN 4.6 07/10/2014   ALBUMIN 4.8 08/18/2013   LABURIC 4.0 10/07/2017   LABURIC 4.8 08/18/2013    No results found for: MG Lab Results  Component Value Date   VD25OH 45.1 02/26/2016    No results found for: PREALBUMIN CBC EXTENDED Latest Ref Rng & Units 04/18/2020 10/07/2017 09/25/2016  WBC 4.0 - 10.5 K/uL 5.4 5.2 6.8  RBC 3.87 - 5.11 MIL/uL 5.08 4.61 4.75  HGB 12.0 - 15.0 g/dL 16.2(H) 14.4 15.3  HCT 36 - 46 % 48.3(H) 42.3 43.6  PLT 150 - 400 K/uL 287 267 -  NEUTROABS 1.40 - 7.00 x10E3/uL - 2.8 -  LYMPHSABS 0 - 3 x10E3/uL - 2.1 -     There is no height or weight on file to calculate BMI.  Orders:  Orders Placed This Encounter  Procedures  . XR Ankle Complete Left  . XR Foot Complete Left  . Ambulatory referral to Physical Therapy   No orders of  the defined types were placed in this encounter.    Procedures: No procedures performed  Clinical Data: No additional findings.  ROS:  All other systems negative, except as noted in the HPI. Review of Systems  Objective: Vital Signs: There were no vitals taken for this visit.  Specialty Comments:  No specialty comments available.  PMFS History: Patient Active Problem List   Diagnosis Date Noted  . Lisfranc dislocation, left, sequela   . Ankle syndesmosis disruption, left, sequela   . Generalized anxiety disorder 11/03/2017  . Neuropathy 11/04/2016  . Paresthesias 11/04/2016   Past Medical History:  Diagnosis Date  . Anxiety   . Arthritis   . Asthma   . Complication of anesthesia    with endoscopic spoke profeaty  . Depression   . Family history of adverse reaction to anesthesia    sister- vommitting  . Headache    magraines in the past  .  Neuropathy    feet mainly- hands some  . Seasonal allergies     Family History  Problem Relation Age of Onset  . Skin cancer Mother   . Asthma Son   . Seizures Maternal Grandmother   . Cancer Maternal Grandmother   . Stroke Maternal Grandmother   . Neuropathy Maternal Grandfather   . Diabetic kidney disease Maternal Grandfather   . Diabetes Maternal Grandfather   . Heart disease Paternal Grandfather   . Hyperlipidemia Paternal Grandfather   . Hypertension Paternal Grandfather     Past Surgical History:  Procedure Laterality Date  . BREAST SURGERY Bilateral    Breast Enhancement  . ESOPHAGOGASTRODUODENOSCOPY     x 3  last one approx 2002  . KNEE SURGERY Right    arthroscopy- cartilage  . ORIF ANKLE FRACTURE Left 04/18/2020   Procedure: OPEN REDUCTION INTERNAL FIXATION (ORIF) SYNDESMOSIS LEFT ANKLE AND LISFRANC JOINT LEFT FOOT;  Surgeon: Nadara Mustard, MD;  Location: [facility] OR;  Service: Orthopedics;  Laterality: Left;   Social History   Occupational History  . Not on file  Tobacco Use  . Smoking status: Former Games developer  . Smokeless tobacco: Never Used  Substance and Sexual Activity  . Alcohol use: Yes    Alcohol/week: 1.0 standard drink    Types: 1 Glasses of wine per week    Comment: occcasionally  . Drug use: No  . Sexual activity: Not on file

## 2020-05-21 ENCOUNTER — Ambulatory Visit: Payer: 59 | Admitting: Physical Therapy

## 2020-05-21 ENCOUNTER — Other Ambulatory Visit: Payer: Self-pay

## 2020-05-21 ENCOUNTER — Encounter: Payer: Self-pay | Admitting: Physical Therapy

## 2020-05-21 DIAGNOSIS — M25672 Stiffness of left ankle, not elsewhere classified: Secondary | ICD-10-CM

## 2020-05-21 DIAGNOSIS — R600 Localized edema: Secondary | ICD-10-CM

## 2020-05-21 DIAGNOSIS — R296 Repeated falls: Secondary | ICD-10-CM

## 2020-05-21 DIAGNOSIS — R2689 Other abnormalities of gait and mobility: Secondary | ICD-10-CM

## 2020-05-21 DIAGNOSIS — M6281 Muscle weakness (generalized): Secondary | ICD-10-CM

## 2020-05-21 DIAGNOSIS — M25572 Pain in left ankle and joints of left foot: Secondary | ICD-10-CM

## 2020-05-21 NOTE — Patient Instructions (Signed)
Access Code: [email or web address] URL: [email or web address] Date: 05/21/2020 Prepared by: Moshe Cipro  Exercises Seated Ankle Alphabet - 3 x daily - 7 x weekly - 1 sets - 1 a-z Seated Ankle Circles - 3 x daily - 7 x weekly - 1 sets - 15 reps Seated Ankle Pumps on Table - 3 x daily - 7 x weekly - 1 sets - 15 reps Long Sitting Calf Stretch with Strap - 3 x daily - 7 x weekly - 1 sets - 3 reps - 30 sec hold

## 2020-05-21 NOTE — Therapy (Signed)
[facility] [address] [place], [place], [address] Phone: [phone]   Fax:  [phone]  Physical Therapy Evaluation  Patient Details  Name: Ann Sampson MRN: [id number] Date of Birth: 01/25/1976 Referring Provider (PT): Persons, [place], [place]   Encounter Date: 05/21/2020   PT End of Session - 05/21/20 1132    Visit Number 1    Number of Visits 16    Date for PT Re-Evaluation 07/16/20    Authorization Type [employer] OTHER    PT Start Time 1015    PT Stop Time 1055    PT Time Calculation (min) 40 min    Activity Tolerance Patient tolerated treatment well    Behavior During Therapy [facility] for tasks assessed/performed           Past Medical History:  Diagnosis Date  . Anxiety   . Arthritis   . Asthma   . Complication of anesthesia    with endoscopic spoke profeaty  . Depression   . Family history of adverse reaction to anesthesia    sister- vommitting  . Headache    magraines in the past  . Neuropathy    feet mainly- hands some  . Seasonal allergies     Past Surgical History:  Procedure Laterality Date  . BREAST SURGERY Bilateral    Breast Enhancement  . ESOPHAGOGASTRODUODENOSCOPY     x 3  last one approx 2002  . KNEE SURGERY Right    arthroscopy- cartilage  . ORIF ANKLE FRACTURE Left 04/18/2020   Procedure: OPEN REDUCTION INTERNAL FIXATION (ORIF) SYNDESMOSIS LEFT ANKLE AND LISFRANC JOINT LEFT FOOT;  Surgeon: Nadara Mustarduda, Marcus V, MD;  Location: [facility] OR;  Service: Orthopedics;  Laterality: Left;    There were no vitals filed for this visit.    Subjective Assessment - 05/21/20 1020    Subjective Pt is a 44 y/o female who presents to OPPT s/pt Lt foot ORIF syndesmosis and lis franc fracture on 04/18/20.  Pt now WBAT and using crutches and knee walker.    Limitations Standing;Walking;House hold activities    Patient Stated Goals improve mobility, return to baseline    Currently in Pain? Yes    Pain Score 0-No pain    pain worse at night   Pain Location Ankle    Pain Orientation Left    Pain Descriptors / Indicators Tightness;Throbbing    Pain Type Acute pain;Surgical pain    Pain Onset More than a month ago    Pain Frequency Occasional    Aggravating Factors  increased stress, walking    Pain Relieving Factors elevation              [facility] PT Assessment - 05/21/20 1024      Assessment   Medical Diagnosis Status post ORIF Syndesmosis and Lis Franc fracture    Referring Provider (PT) Persons, [place], [place]    Onset Date/Surgical Date 04/18/20    Hand Dominance Right    Next MD Visit 06/07/20    Prior Therapy recently for shoulder      Precautions   Precautions None    Required Braces or Orthoses Other Brace/Splint    Other Brace/Splint WBAT in boot      Restrictions   Weight Bearing Restrictions No      Balance Screen   Has the patient fallen in the past 6 months Yes    How many times? 1 - other falls with DME use    Has the patient had a  decrease in activity level because of a fear of falling?  Yes    Is the patient reluctant to leave their home because of a fear of falling?  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; walking throughout the building, has a standing desk    Leisure hiking, reading; walking      Cognition   Overall Cognitive Status Within Functional Limits for tasks assessed      Observation/Other  Assessments   Observations Lt ankle resting in 19 deg inversion; increased edema noted in Lt foot/ankle    Focus on Therapeutic Outcomes (FOTO)  19 (predicted 58)      AROM   AROM Assessment Site Ankle    Right/Left Ankle Right;Left    Right Ankle Dorsiflexion 9    Right Ankle Plantar Flexion 71    Right Ankle Inversion 42    Right Ankle Eversion 12    Left Ankle Dorsiflexion -10    Left Ankle Plantar Flexion 33    Left Ankle Inversion 24    Left Ankle Eversion -19      PROM   PROM Assessment Site Ankle    Right/Left Shoulder Left    Right/Left Ankle Left    Left Ankle Dorsiflexion 0    Left Ankle Plantar Flexion 35    Left Ankle Inversion 35    Left Ankle Eversion -5      Strength   Overall Strength Comments Lt ankle DF 2/5, PF 1/5, inversion 2/5, eversion 0-1/5      Ambulation/Gait   Ambulation/Gait Yes    Ambulation/Gait Assistance 5: Supervision    Ambulation Distance (Feet) 100 Feet    Assistive device Crutches;R Axillary Crutch;L Axillary Crutch    Gait Pattern Step-to pattern;Decreased stance time - left;Decreased step length - right;Decreased weight shift to left                      Objective measurements completed on examination: See above findings.       [facility] Adult PT Treatment/Exercise - 05/21/20 1024      Ambulation/Gait   Gait Comments forward lean on crutches; adjusted height of crutches and cues for posture with improved mechanics      Exercises   Exercises Ankle      Ankle Exercises: Seated   ABC's 1 rep    ABC's Limitations cues to cross LLE over Rt to decrease compensation    Ankle Circles/Pumps Left;10 reps    Ankle Circles/Pumps Limitations pumps/circles    Other Seated Ankle Exercises verbally reviewed DF stretch with towel - given to pt by PA                  PT Education - 05/21/20 1103    Education Details HEP    Person(s) Educated Patient    Methods Explanation;Demonstration;Handout    Comprehension  Verbalized understanding;Returned demonstration;Need further instruction            PT Short Term Goals - 05/21/20 1136      PT SHORT TERM GOAL #1   Title Pt wil be Ind in an initial HEP.    Status New    Target Date 06/18/20      PT SHORT TERM GOAL #2   Title improve Lt ankle ROM by 8 degrees all motions for improved function    Status New    Target Date 06/18/20      PT SHORT TERM GOAL #3   Title amb with single crutch and boot (if still indicated) without increase in pain or significant gait deviations    Status New    Target Date 06/18/20      PT SHORT TERM GOAL #4   Title negotiate stairs Mod I in order to return to sleeping in bedroom    Status New    Target Date 06/18/20             PT Long Term Goals - 05/21/20 1137      PT LONG TERM GOAL #1   Title independent with final HEP    Status New    Target Date 07/16/20      PT LONG TERM GOAL #2   Title demonstrate Lt ankle ROM to WNL for improved function    Status New    Target Date 07/16/20      PT LONG TERM GOAL #3   Title demonstrate 3/5 Lt ankle strength for improved function    Status New    Target Date 07/16/20      PT LONG TERM GOAL #4   Title amb without AD independently for improved function    Status New    Target Date 07/16/20      PT LONG TERM GOAL #5   Title FOTO score improved to 58    Status New    Target Date 07/16/20                  Plan - 05/21/20 1132    Clinical Impression Statement Pt is a 44 y/o female who presents to OPPT s/p Lt ORIF syndemosist and lis franc fracture.  Pt demonstrates decreased ROM and balance, and decreased Lt ankle strength (limited muscle activation noted), as well as increased edema and gait abnormalities affecting functional mobility.  Pt will benefit from PT to address deficits listed.    Personal Factors and Comorbidities Comorbidity 1    Comorbidities arthritis    Examination-Activity Limitations  Bathing;Squat;Stairs;Stand;Transfers;Locomotion Level;Lift    Stability/Clinical Decision Making Evolving/Moderate complexity    Clinical Decision Making Moderate    Rehab Potential Good    PT Frequency 2x / week    PT Duration 8 weeks    PT Treatment/Interventions ADLs/Self Care Home Management;Cryotherapy;Electrical Stimulation;Iontophoresis 4mg /ml Dexamethasone;Moist Heat;Ultrasound;Therapeutic exercise;Therapeutic activities;Patient/family education;Manual techniques;Dry needling;Taping;Vasopneumatic Device;Balance training;Functional mobility training;Stair training;Gait training;DME Instruction;Neuromuscular re-education;Passive range of motion;Scar mobilization    PT Next Visit Plan work on gait training with crutches, needs stair training (currently sleeping downstairs), Lt ankle AROM/strength/PROM    PT Home Exercise Plan Access Code:    Consulted and Agree with Plan of Care Patient           Patient will benefit from skilled therapeutic intervention in order to improve the following deficits and impairments:  Decreased range of motion, Abnormal gait, Decreased endurance, Increased edema, Decreased scar mobility, Decreased knowledge of precautions, Decreased knowledge of  use of DME, Decreased activity tolerance, Decreased mobility, Decreased balance, Difficulty walking, Decreased strength  Visit Diagnosis: Pain in left ankle and joints of left foot - Plan: PT plan of care cert/re-cert  Stiffness of left ankle, not elsewhere classified - Plan: PT plan of care cert/re-cert  Muscle weakness (generalized) - Plan: PT plan of care cert/re-cert  Other abnormalities of gait and mobility - Plan: PT plan of care cert/re-cert  Localized edema - Plan: PT plan of care cert/re-cert  Repeated falls - Plan: PT plan of care cert/re-cert     Problem List Patient Active Problem List   Diagnosis Date Noted  . Lisfranc dislocation, left, sequela   . Ankle syndesmosis disruption,  left, sequela   . Generalized anxiety disorder 11/03/2017  . Neuropathy 11/04/2016  . Paresthesias 11/04/2016      Clarita Crane, PT, DPT 05/21/20 11:44 AM     [facility] Physical Therapy [address] [place], [place], [address] Phone: [phone]   Fax:  [phone]  Name: Ann Sampson MRN: [id number] Date of Birth: Feb 04, 1976

## 2020-05-23 ENCOUNTER — Other Ambulatory Visit: Payer: Self-pay

## 2020-05-23 ENCOUNTER — Encounter: Payer: Self-pay | Admitting: Physical Therapy

## 2020-05-23 ENCOUNTER — Ambulatory Visit (INDEPENDENT_AMBULATORY_CARE_PROVIDER_SITE_OTHER): Payer: 59 | Admitting: Physical Therapy

## 2020-05-23 DIAGNOSIS — M25572 Pain in left ankle and joints of left foot: Secondary | ICD-10-CM | POA: Diagnosis not present

## 2020-05-23 DIAGNOSIS — R296 Repeated falls: Secondary | ICD-10-CM

## 2020-05-23 DIAGNOSIS — M25672 Stiffness of left ankle, not elsewhere classified: Secondary | ICD-10-CM

## 2020-05-23 DIAGNOSIS — R600 Localized edema: Secondary | ICD-10-CM

## 2020-05-23 DIAGNOSIS — R2689 Other abnormalities of gait and mobility: Secondary | ICD-10-CM | POA: Diagnosis not present

## 2020-05-23 DIAGNOSIS — M6281 Muscle weakness (generalized): Secondary | ICD-10-CM

## 2020-05-23 NOTE — Therapy (Signed)
[facility] [address] [place], [place], [address] Phone: [phone]   Fax:  [phone]  Physical Therapy Treatment  Patient Details  Name: Ann Sampson MRN: [id number] Date of Birth: 16-Jul-1975 Referring Provider (PT): Persons, [place], [place]   Encounter Date: 05/23/2020   PT End of Session - 05/23/20 0916    Visit Number 2    Number of Visits 16    Date for PT Re-Evaluation 07/16/20    Authorization Type [employer] OTHER    PT Start Time 0800    PT Stop Time 0850    PT Time Calculation (min) 50 min    Activity Tolerance Patient tolerated treatment well    Behavior During Therapy [facility] for tasks assessed/performed           Past Medical History:  Diagnosis Date  . Anxiety   . Arthritis   . Asthma   . Complication of anesthesia    with endoscopic spoke profeaty  . Depression   . Family history of adverse reaction to anesthesia    sister- vommitting  . Headache    magraines in the past  . Neuropathy    feet mainly- hands some  . Seasonal allergies     Past Surgical History:  Procedure Laterality Date  . BREAST SURGERY Bilateral    Breast Enhancement  . ESOPHAGOGASTRODUODENOSCOPY     x 3  last one approx 2002  . KNEE SURGERY Right    arthroscopy- cartilage  . ORIF ANKLE FRACTURE Left 04/18/2020   Procedure: OPEN REDUCTION INTERNAL FIXATION (ORIF) SYNDESMOSIS LEFT ANKLE AND LISFRANC JOINT LEFT FOOT;  Surgeon: Nadara Mustard, MD;  [facility] OR;  Service: Orthopedics;  Laterality: Left;    There were no vitals filed for this visit.   Subjective Assessment - 05/23/20 0915    Subjective Pt arriving today reporting soreness in L ankle. Pt amb using bilateral axillary crutches and CAM walker.    Limitations Standing;Walking;House hold activities    Patient Stated Goals improve mobility, return to baseline    Currently in Pain? Yes    Pain Score 2     Pain Location Ankle    Pain Orientation Left    Pain  Descriptors / Indicators Sore    Pain Type Surgical pain;Acute pain    Pain Onset More than a month ago                             [facility] Adult PT Treatment/Exercise - 05/23/20 0001      Exercises   Exercises Ankle      Modalities   Modalities Electrical Stimulation;Vasopneumatic      Programme researcher, broadcasting/film/video Action Russian    Electrical Stimulation Parameters 10/50 on/off     Electrical Stimulation Goals Neuromuscular facilitation      Vasopneumatic   Number Minutes Vasopneumatic  10 minutes    Vasopnuematic Location  Ankle    Vasopneumatic Pressure Low    Vasopneumatic Temperature  34      Manual Therapy   Passive ROM ankle 4 way stretches      Ankle Exercises: Stretches   Gastroc Stretch 5 reps;30 seconds    Other Stretch Achilles stretch:       Ankle Exercises: Seated   ABC's 1 rep    Ankle Circles/Pumps Left;10 reps    Ankle Circles/Pumps Limitations pumps/circles    Towel Inversion/Eversion 5 reps;Limitations    Towel  Inversion/Eversion Weights (lbs) x 3 sets    Other Seated Ankle Exercises towel scrunches: x 5,, towel pick up with toes ( pt had dfficulty, 5 attempts made)    Other Seated Ankle Exercises ankle df/pf  2x 10                    PT Short Term Goals - 05/21/20 1136      PT SHORT TERM GOAL #1   Title Pt wil be Ind in an initial HEP.    Status New    Target Date 06/18/20      PT SHORT TERM GOAL #2   Title improve Lt ankle ROM by 8 degrees all motions for improved function    Status New    Target Date 06/18/20      PT SHORT TERM GOAL #3   Title amb with single crutch and boot (if still indicated) without increase in pain or significant gait deviations    Status New    Target Date 06/18/20      PT SHORT TERM GOAL #4   Title negotiate stairs Mod I in order to return to sleeping in bedroom    Status New    Target Date 06/18/20             PT Long Term Goals - 05/21/20 1137      PT  LONG TERM GOAL #1   Title independent with final HEP    Status New    Target Date 07/16/20      PT LONG TERM GOAL #2   Title demonstrate Lt ankle ROM to WNL for improved function    Status New    Target Date 07/16/20      PT LONG TERM GOAL #3   Title demonstrate 3/5 Lt ankle strength for improved function    Status New    Target Date 07/16/20      PT LONG TERM GOAL #4   Title amb without AD independently for improved function    Status New    Target Date 07/16/20      PT LONG TERM GOAL #5   Title FOTO score improved to 58    Status New    Target Date 07/16/20                 Plan - 05/23/20 0834    Clinical Impression Statement Pt tolerating exercises well with increased sensitivity to touch and pressure. PROM performed, exercises added to pt's HEP and [place] E-stim to pt's peroneals to elicit eversion of left ankle. Continue skilled PT to progress with overall strength and functional mobility.    Personal Factors and Comorbidities Comorbidity 1    Comorbidities arthritis    Examination-Activity Limitations Bathing;Squat;Stairs;Stand;Transfers;Locomotion Level;Lift    Stability/Clinical Decision Making Evolving/Moderate complexity    Rehab Potential Good    PT Frequency 2x / week    PT Duration 8 weeks    PT Treatment/Interventions ADLs/Self Care Home Management;Cryotherapy;Electrical Stimulation;Iontophoresis 4mg /ml Dexamethasone;Moist Heat;Ultrasound;Therapeutic exercise;Therapeutic activities;Patient/family education;Manual techniques;Dry needling;Taping;Vasopneumatic Device;Balance training;Functional mobility training;Stair training;Gait training;DME Instruction;Neuromuscular re-education;Passive range of motion;Scar mobilization    PT Next Visit Plan work on gait training with crutches, needs stair training (currently sleeping downstairs), Lt ankle AROM/strength/PROM    PT Home Exercise Plan Access Code:           Patient will benefit from skilled  therapeutic intervention in order to improve the following deficits and impairments:  Decreased range of motion, Abnormal gait, Decreased endurance,  Increased edema, Decreased scar mobility, Decreased knowledge of precautions, Decreased knowledge of use of DME, Decreased activity tolerance, Decreased mobility, Decreased balance, Difficulty walking, Decreased strength  Visit Diagnosis: Pain in left ankle and joints of left foot  Stiffness of left ankle, not elsewhere classified  Muscle weakness (generalized)  Other abnormalities of gait and mobility  Localized edema  Repeated falls     Problem List Patient Active Problem List   Diagnosis Date Noted  . Lisfranc dislocation, left, sequela   . Ankle syndesmosis disruption, left, sequela   . Generalized anxiety disorder 11/03/2017  . Neuropathy 11/04/2016  . Paresthesias 11/04/2016    Sharmon Leyden, PT MPT 05/23/2020, 10:21 AM  [facility] Physical Therapy [address] [place], [place], [address] Phone: [phone]   Fax:  [phone]  Name: TORIA MONTE MRN: [id number] Date of Birth: Jul 13, 1975

## 2020-05-28 ENCOUNTER — Ambulatory Visit: Payer: 59 | Admitting: Physical Therapy

## 2020-05-28 ENCOUNTER — Other Ambulatory Visit: Payer: Self-pay

## 2020-05-28 ENCOUNTER — Encounter: Payer: Self-pay | Admitting: Physical Therapy

## 2020-05-28 DIAGNOSIS — R2689 Other abnormalities of gait and mobility: Secondary | ICD-10-CM

## 2020-05-28 DIAGNOSIS — M6281 Muscle weakness (generalized): Secondary | ICD-10-CM

## 2020-05-28 DIAGNOSIS — M25672 Stiffness of left ankle, not elsewhere classified: Secondary | ICD-10-CM

## 2020-05-28 DIAGNOSIS — M25572 Pain in left ankle and joints of left foot: Secondary | ICD-10-CM | POA: Diagnosis not present

## 2020-05-28 DIAGNOSIS — R600 Localized edema: Secondary | ICD-10-CM

## 2020-05-28 DIAGNOSIS — R296 Repeated falls: Secondary | ICD-10-CM

## 2020-05-28 NOTE — Therapy (Signed)
[facility] [address] [place], [place], [address] Phone: [phone]   Fax:  [phone]  Physical Therapy Treatment  Patient Details  Name: Ann Sampson MRN: [id number] Date of Birth: 08/06/1975 Referring Provider (PT): Persons, [place], [place]   Encounter Date: 05/28/2020   PT End of Session - 05/28/20 1539    Visit Number 3    Number of Visits 16    Date for PT Re-Evaluation 07/16/20    Authorization Type [employer] OTHER    PT Start Time 1352    PT Stop Time 1430    PT Time Calculation (min) 38 min    Behavior During Therapy [facility] for tasks assessed/performed           Past Medical History:  Diagnosis Date  . Anxiety   . Arthritis   . Asthma   . Complication of anesthesia    with endoscopic spoke profeaty  . Depression   . Family history of adverse reaction to anesthesia    sister- vommitting  . Headache    magraines in the past  . Neuropathy    feet mainly- hands some  . Seasonal allergies     Past Surgical History:  Procedure Laterality Date  . BREAST SURGERY Bilateral    Breast Enhancement  . ESOPHAGOGASTRODUODENOSCOPY     x 3  last one approx 2002  . KNEE SURGERY Right    arthroscopy- cartilage  . ORIF ANKLE FRACTURE Left 04/18/2020   Procedure: OPEN REDUCTION INTERNAL FIXATION (ORIF) SYNDESMOSIS LEFT ANKLE AND LISFRANC JOINT LEFT FOOT;  Surgeon: Nadara Mustard, MD;  [facility]: [facility] OR;  Service: Orthopedics;  Laterality: Left;    There were no vitals filed for this visit.   Subjective Assessment - 05/28/20 1427    Subjective Pt arriving today reporting L ankle pain of 3/10 upon arrival. Pt wearing CAM walker. Pt reporting tightness.    Limitations Standing;Walking;House hold activities    Currently in Pain? Yes    Pain Score 3     Pain Location Ankle    Pain Orientation Left    Pain Descriptors / Indicators Sore;Aching    Pain Onset More than a month ago                              [facility] Adult PT Treatment/Exercise - 05/28/20 0001      Exercises   Exercises Ankle      Vasopneumatic   Number Minutes Vasopneumatic  10 minutes    Vasopnuematic Location  Ankle    Vasopneumatic Pressure Low    Vasopneumatic Temperature  34      Manual Therapy   Joint Mobilization dorsiflexion/plantar flexion mobs, pt with increased sensitivity     Passive ROM ankle 4 way stretches      Ankle Exercises: Stretches   Gastroc Stretch 5 reps;30 seconds    Other Stretch Achilles stretch:       Ankle Exercises: Seated   ABC's 1 rep    Ankle Circles/Pumps Left;10 reps    Ankle Circles/Pumps Limitations pumps/circles    BAPS Sitting;Level 2;10 reps    Other Seated Ankle Exercises rocker board sitting x 20 reps (df/pf)    Other Seated Ankle Exercises rocker board side to side x 20 reps seated                    PT Short Term Goals - 05/21/20 1136  PT SHORT TERM GOAL #1   Title Pt wil be Ind in an initial HEP.    Status New    Target Date 06/18/20      PT SHORT TERM GOAL #2   Title improve Lt ankle ROM by 8 degrees all motions for improved function    Status New    Target Date 06/18/20      PT SHORT TERM GOAL #3   Title amb with single crutch and boot (if still indicated) without increase in pain or significant gait deviations    Status New    Target Date 06/18/20      PT SHORT TERM GOAL #4   Title negotiate stairs Mod I in order to return to sleeping in bedroom    Status New    Target Date 06/18/20             PT Long Term Goals - 05/28/20 1444      PT LONG TERM GOAL #1   Title independent with final HEP    Baseline 105, 93, 25, 45 respectively    Period Weeks    Status On-going      PT LONG TERM GOAL #2   Title demonstrate Lt ankle ROM to WNL for improved function    Baseline 5/10    Time 4    Period Weeks    Status New      PT LONG TERM GOAL #3   Title demonstrate 3/5 Lt ankle strength for improved  function    Baseline Much difficulty    Period Weeks    Status On-going      PT LONG TERM GOAL #4   Title amb without AD independently for improved function    Baseline 33 % limitation    Status On-going      PT LONG TERM GOAL #5   Title FOTO score improved to 58    Status On-going                 Plan - 05/28/20 1439    Clinical Impression Statement Pt tolerating exercises well with limitations by increased sensitivity in her L ankle with light touch. Pt presenting with activation of her peroneals on L ankle. During seated BAPS board activities, pt became nauseous due to increased pain. Pt was instructed in de-sensitizing techniques at home (rubbing with different cloths, rice bucket exercises, placing foot on different surfaces). Continiue skilled PT as pt tolerates.    Personal Factors and Comorbidities Comorbidity 1    Comorbidities arthritis    Examination-Activity Limitations Bathing;Squat;Stairs;Stand;Transfers;Locomotion Level;Lift    Stability/Clinical Decision Making Evolving/Moderate complexity    Rehab Potential Good    PT Frequency 2x / week    PT Duration 8 weeks    PT Treatment/Interventions ADLs/Self Care Home Management;Cryotherapy;Electrical Stimulation;Iontophoresis 4mg /ml Dexamethasone;Moist Heat;Ultrasound;Therapeutic exercise;Therapeutic activities;Patient/family education;Manual techniques;Dry needling;Taping;Vasopneumatic Device;Balance training;Functional mobility training;Stair training;Gait training;DME Instruction;Neuromuscular re-education;Passive range of motion;Scar mobilization    PT Next Visit Plan work on gait training with crutches, needs stair training (currently sleeping downstairs), Lt ankle AROM/strength/PROM    PT Home Exercise Plan Access Code:    Consulted and Agree with Plan of Care Patient           Patient will benefit from skilled therapeutic intervention in order to improve the following deficits and impairments:   Decreased range of motion, Abnormal gait, Decreased endurance, Increased edema, Decreased scar mobility, Decreased knowledge of precautions, Decreased knowledge of use of DME, Decreased activity tolerance, Decreased mobility, Decreased  balance, Difficulty walking, Decreased strength  Visit Diagnosis: Pain in left ankle and joints of left foot  Stiffness of left ankle, not elsewhere classified  Muscle weakness (generalized)  Other abnormalities of gait and mobility  Localized edema  Repeated falls     Problem List Patient Active Problem List   Diagnosis Date Noted  . Lisfranc dislocation, left, sequela   . Ankle syndesmosis disruption, left, sequela   . Generalized anxiety disorder 11/03/2017  . Neuropathy 11/04/2016  . Paresthesias 11/04/2016    Sharmon Leyden PT, MPT 05/28/2020, 3:45 PM  [facility] Therapy [address] [place], [place], [address] Phone: [phone]   Fax:  [phone]  Name: Ann Sampson MRN: [id number] Date of Birth: 04/01/1976

## 2020-06-01 ENCOUNTER — Ambulatory Visit: Payer: 59 | Admitting: Physical Therapy

## 2020-06-01 ENCOUNTER — Other Ambulatory Visit: Payer: Self-pay

## 2020-06-01 DIAGNOSIS — R600 Localized edema: Secondary | ICD-10-CM

## 2020-06-01 DIAGNOSIS — M25572 Pain in left ankle and joints of left foot: Secondary | ICD-10-CM | POA: Diagnosis not present

## 2020-06-01 DIAGNOSIS — R2689 Other abnormalities of gait and mobility: Secondary | ICD-10-CM

## 2020-06-01 DIAGNOSIS — M25672 Stiffness of left ankle, not elsewhere classified: Secondary | ICD-10-CM | POA: Diagnosis not present

## 2020-06-01 DIAGNOSIS — M6281 Muscle weakness (generalized): Secondary | ICD-10-CM | POA: Diagnosis not present

## 2020-06-01 NOTE — Therapy (Signed)
[facility] Physical Therapy [address] [place], [place], [address] Phone: [phone]   Fax:  [phone]  Physical Therapy Treatment  Patient Details  Name: Ann Sampson MRN: [id number] Date of Birth: 1975/09/29 Referring Provider (PT): Persons, [place], [place]   Encounter Date: 06/01/2020   PT End of Session - 06/01/20 1324    Visit Number 4    Number of Visits 16    Date for PT Re-Evaluation 07/16/20    Authorization Type [employer] OTHER    PT Start Time 1145    PT Stop Time 1230    PT Time Calculation (min) 45 min    Behavior During Therapy WFL for tasks assessed/performed           Past Medical History:  Diagnosis Date   Anxiety    Arthritis    Asthma    Complication of anesthesia    with endoscopic spoke profeaty   Depression    Family history of adverse reaction to anesthesia    sister- vommitting   Headache    magraines in the past   Neuropathy    feet mainly- hands some   Seasonal allergies     Past Surgical History:  Procedure Laterality Date   BREAST SURGERY Bilateral    Breast Enhancement   ESOPHAGOGASTRODUODENOSCOPY     x 3  last one approx 2002   KNEE SURGERY Right    arthroscopy- cartilage   ORIF ANKLE FRACTURE Left 04/18/2020   Procedure: OPEN REDUCTION INTERNAL FIXATION (ORIF) SYNDESMOSIS LEFT ANKLE AND LISFRANC JOINT LEFT FOOT;  Surgeon: Nadara Mustard, MD;  [facility] OR;  Service: Orthopedics;  Laterality: Left;    There were no vitals filed for this visit.   Subjective Assessment - 06/01/20 1219    Subjective relays her foot and arch still swells and she gets naseuated at times when the pain and sensitivity is too much for her Lt foot    Limitations Standing;Walking;House hold activities    Patient Stated Goals improve mobility, return to baseline    Pain Onset More than a month ago                             [facility] Adult PT Treatment/Exercise - 06/01/20 0001        Vasopneumatic   Number Minutes Vasopneumatic  10 minutes    Vasopnuematic Location  Ankle    Vasopneumatic Pressure Low    Vasopneumatic Temperature  34      Manual Therapy   Passive ROM ankle 4 way stretches      Ankle Exercises: Stretches   Soleus Stretch --   seated with foot slider, 10 sec X 10 reps   Gastroc Stretch 3 reps;30 seconds      Ankle Exercises: Aerobic   Nustep L2 X 6 min      Ankle Exercises: Seated   BAPS Sitting;Level 2;10 reps    Other Seated Ankle Exercises rocker board A-P 2 min, lateral 2 min                    PT Short Term Goals - 05/21/20 1136      PT SHORT TERM GOAL #1   Title Pt wil be Ind in an initial HEP.    Status New    Target Date 06/18/20      PT SHORT TERM GOAL #2   Title improve Lt ankle ROM by 8 degrees all  motions for improved function    Status New    Target Date 06/18/20      PT SHORT TERM GOAL #3   Title amb with single crutch and boot (if still indicated) without increase in pain or significant gait deviations    Status New    Target Date 06/18/20      PT SHORT TERM GOAL #4   Title negotiate stairs Mod I in order to return to sleeping in bedroom    Status New    Target Date 06/18/20             PT Long Term Goals - 05/28/20 1444      PT LONG TERM GOAL #1   Title independent with final HEP    Baseline 105, 93, 25, 45 respectively    Period Weeks    Status On-going      PT LONG TERM GOAL #2   Title demonstrate Lt ankle ROM to WNL for improved function    Baseline 5/10    Time 4    Period Weeks    Status New      PT LONG TERM GOAL #3   Title demonstrate 3/5 Lt ankle strength for improved function    Baseline Much difficulty    Period Weeks    Status On-going      PT LONG TERM GOAL #4   Title amb without AD independently for improved function    Baseline 33 % limitation    Status On-going      PT LONG TERM GOAL #5   Title FOTO score improved to 58    Status On-going                  Plan - 06/01/20 1325    Clinical Impression Statement Continued to work on ankle ROM and light muscle activiation as tolerated. She continues to have what appears like neuropathic pain. PT will attempt to progress as able.    Personal Factors and Comorbidities Comorbidity 1    Comorbidities arthritis    Examination-Activity Limitations Bathing;Squat;Stairs;Stand;Transfers;Locomotion Level;Lift    Stability/Clinical Decision Making Evolving/Moderate complexity    Rehab Potential Good    PT Frequency 2x / week    PT Duration 8 weeks    PT Treatment/Interventions ADLs/Self Care Home Management;Cryotherapy;Electrical Stimulation;Iontophoresis 4mg /ml Dexamethasone;Moist Heat;Ultrasound;Therapeutic exercise;Therapeutic activities;Patient/family education;Manual techniques;Dry needling;Taping;Vasopneumatic Device;Balance training;Functional mobility training;Stair training;Gait training;DME Instruction;Neuromuscular re-education;Passive range of motion;Scar mobilization    PT Next Visit Plan work on gait training with crutches, needs stair training (currently sleeping downstairs), Lt ankle AROM/strength/PROM    PT Home Exercise Plan Access Code:    Consulted and Agree with Plan of Care Patient           Patient will benefit from skilled therapeutic intervention in order to improve the following deficits and impairments:  Decreased range of motion, Abnormal gait, Decreased endurance, Increased edema, Decreased scar mobility, Decreased knowledge of precautions, Decreased knowledge of use of DME, Decreased activity tolerance, Decreased mobility, Decreased balance, Difficulty walking, Decreased strength  Visit Diagnosis: Pain in left ankle and joints of left foot  Stiffness of left ankle, not elsewhere classified  Muscle weakness (generalized)  Other abnormalities of gait and mobility  Localized edema     Problem List Patient Active Problem List   Diagnosis Date  Noted   Lisfranc dislocation, left, sequela    Ankle syndesmosis disruption, left, sequela    Generalized anxiety disorder 11/03/2017   Neuropathy 11/04/2016   Paresthesias 11/04/2016  Birdie Riddle 06/01/2020, 1:28 PM  [facility] Physical Therapy [address] [place], [place], [address] Phone: [phone]   Fax:  [phone]  Name: Ann Sampson MRN: [id number] Date of Birth: February 26, 1976

## 2020-06-04 ENCOUNTER — Ambulatory Visit: Payer: 59 | Admitting: Physical Therapy

## 2020-06-04 ENCOUNTER — Other Ambulatory Visit: Payer: Self-pay

## 2020-06-04 ENCOUNTER — Encounter: Payer: Self-pay | Admitting: Physical Therapy

## 2020-06-04 DIAGNOSIS — R600 Localized edema: Secondary | ICD-10-CM

## 2020-06-04 DIAGNOSIS — R2689 Other abnormalities of gait and mobility: Secondary | ICD-10-CM

## 2020-06-04 DIAGNOSIS — M25672 Stiffness of left ankle, not elsewhere classified: Secondary | ICD-10-CM

## 2020-06-04 DIAGNOSIS — M6281 Muscle weakness (generalized): Secondary | ICD-10-CM

## 2020-06-04 DIAGNOSIS — R296 Repeated falls: Secondary | ICD-10-CM

## 2020-06-04 DIAGNOSIS — M25572 Pain in left ankle and joints of left foot: Secondary | ICD-10-CM

## 2020-06-04 NOTE — Therapy (Signed)
[facility] Physical Therapy [address] [place], [place], [address] Phone: [phone]   Fax:  [phone]  Physical Therapy Treatment  Patient Details  Name: Ann Sampson MRN: [id number] Date of Birth: Oct 04, 1975 Referring Provider (PT): Persons, [place], [place]   Encounter Date: 06/04/2020   PT End of Session - 06/04/20 1552    Visit Number 5    Number of Visits 16    Date for PT Re-Evaluation 07/16/20    Authorization Type [employer] OTHER    PT Start Time 1510    PT Stop Time 1559    PT Time Calculation (min) 49 min    Behavior During Therapy [facility] for tasks assessed/performed           Past Medical History:  Diagnosis Date  . Anxiety   . Arthritis   . Asthma   . Complication of anesthesia    with endoscopic spoke profeaty  . Depression   . Family history of adverse reaction to anesthesia    sister- vommitting  . Headache    magraines in the past  . Neuropathy    feet mainly- hands some  . Seasonal allergies     Past Surgical History:  Procedure Laterality Date  . BREAST SURGERY Bilateral    Breast Enhancement  . ESOPHAGOGASTRODUODENOSCOPY     x 3  last one approx 2002  . KNEE SURGERY Right    arthroscopy- cartilage  . ORIF ANKLE FRACTURE Left 04/18/2020   Procedure: OPEN REDUCTION INTERNAL FIXATION (ORIF) SYNDESMOSIS LEFT ANKLE AND LISFRANC JOINT LEFT FOOT;  Surgeon: Nadara Mustard, MD;  Location: [facility] OR;  Service: Orthopedics;  Laterality: Left;    There were no vitals filed for this visit.   Subjective Assessment - 06/04/20 1512    Subjective foot is a little more painful today    Limitations Standing;Walking;House hold activities    Patient Stated Goals improve mobility, return to baseline    Currently in Pain? Yes    Pain Location Ankle    Pain Orientation Left    Pain Descriptors / Indicators Aching;Sore    Pain Type Acute pain;Surgical pain    Pain Onset More than a month ago    Pain Frequency Occasional     Aggravating Factors  increased stress, walking    Pain Relieving Factors elevation                             [facility] Adult PT Treatment/Exercise - 06/04/20 1516      Ambulation/Gait   Ambulation/Gait Yes    Stairs Yes    Stairs Assistance 5: Supervision    Stair Management Technique One rail Left;Step to pattern;With crutches    Number of Stairs 8    Height of Stairs 6      Vasopneumatic   Number Minutes Vasopneumatic  10 minutes    Vasopnuematic Location  Ankle    Vasopneumatic Pressure Low    Vasopneumatic Temperature  34      Manual Therapy   Passive ROM ankle 4 way stretches      Ankle Exercises: Aerobic   Nustep L5 x 8 min      Ankle Exercises: Seated   Ankle Circles/Pumps Left;20 reps    Ankle Circles/Pumps Limitations pumps/circles    Other Seated Ankle Exercises ankle 4-way x 20 reps                    PT  Short Term Goals - 05/21/20 1136      PT SHORT TERM GOAL #1   Title Pt wil be Ind in an initial HEP.    Status New    Target Date 06/18/20      PT SHORT TERM GOAL #2   Title improve Lt ankle ROM by 8 degrees all motions for improved function    Status New    Target Date 06/18/20      PT SHORT TERM GOAL #3   Title amb with single crutch and boot (if still indicated) without increase in pain or significant gait deviations    Status New    Target Date 06/18/20      PT SHORT TERM GOAL #4   Title negotiate stairs Mod I in order to return to sleeping in bedroom    Status New    Target Date 06/18/20             PT Long Term Goals - 05/28/20 1444      PT LONG TERM GOAL #1   Title independent with final HEP    Baseline 105, 93, 25, 45 respectively    Period Weeks    Status On-going      PT LONG TERM GOAL #2   Title demonstrate Lt ankle ROM to WNL for improved function    Baseline 5/10    Time 4    Period Weeks    Status New      PT LONG TERM GOAL #3   Title demonstrate 3/5 Lt ankle strength for improved function     Baseline Much difficulty    Period Weeks    Status On-going      PT LONG TERM GOAL #4   Title amb without AD independently for improved function    Baseline 33 % limitation    Status On-going      PT LONG TERM GOAL #5   Title FOTO score improved to 58    Status On-going                 Plan - 06/04/20 1559    Clinical Impression Statement Demonstrated safe technique with stairs today, and feel she will be safe negotiating stairs at home within the next 1-2 weeks.  Overall progressing well with PT.    Personal Factors and Comorbidities Comorbidity 1    Comorbidities arthritis    Examination-Activity Limitations Bathing;Squat;Stairs;Stand;Transfers;Locomotion Level;Lift    Stability/Clinical Decision Making Evolving/Moderate complexity    Rehab Potential Good    PT Frequency 2x / week    PT Duration 8 weeks    PT Treatment/Interventions ADLs/Self Care Home Management;Cryotherapy;Electrical Stimulation;Iontophoresis 4mg /ml Dexamethasone;Moist Heat;Ultrasound;Therapeutic exercise;Therapeutic activities;Patient/family education;Manual techniques;Dry needling;Taping;Vasopneumatic Device;Balance training;Functional mobility training;Stair training;Gait training;DME Instruction;Neuromuscular re-education;Passive range of motion;Scar mobilization    PT Next Visit Plan needs stair training (currently sleeping downstairs), Lt ankle AROM/strength/PROM    PT Home Exercise Plan Access Code:    Consulted and Agree with Plan of Care Patient           Patient will benefit from skilled therapeutic intervention in order to improve the following deficits and impairments:  Decreased range of motion, Abnormal gait, Decreased endurance, Increased edema, Decreased scar mobility, Decreased knowledge of precautions, Decreased knowledge of use of DME, Decreased activity tolerance, Decreased mobility, Decreased balance, Difficulty walking, Decreased strength  Visit Diagnosis: Pain in left  ankle and joints of left foot  Stiffness of left ankle, not elsewhere classified  Muscle weakness (generalized)  Other abnormalities  of gait and mobility  Localized edema  Repeated falls     Problem List Patient Active Problem List   Diagnosis Date Noted  . Lisfranc dislocation, left, sequela   . Ankle syndesmosis disruption, left, sequela   . Generalized anxiety disorder 11/03/2017  . Neuropathy 11/04/2016  . Paresthesias 11/04/2016      Clarita Crane, PT, DPT 06/04/20 4:01 PM    [facility] Physical Therapy [address] [place], [place], [address] Phone: [phone]   Fax:  [phone]  Name: Ann Sampson MRN: [id number] Date of Birth: [birth year]

## 2020-06-06 ENCOUNTER — Other Ambulatory Visit: Payer: Self-pay

## 2020-06-06 ENCOUNTER — Encounter: Payer: Self-pay | Admitting: Physical Therapy

## 2020-06-06 ENCOUNTER — Ambulatory Visit: Payer: 59 | Admitting: Physical Therapy

## 2020-06-06 DIAGNOSIS — M25572 Pain in left ankle and joints of left foot: Secondary | ICD-10-CM | POA: Diagnosis not present

## 2020-06-06 DIAGNOSIS — R600 Localized edema: Secondary | ICD-10-CM

## 2020-06-06 DIAGNOSIS — M25672 Stiffness of left ankle, not elsewhere classified: Secondary | ICD-10-CM | POA: Diagnosis not present

## 2020-06-06 DIAGNOSIS — M6281 Muscle weakness (generalized): Secondary | ICD-10-CM | POA: Diagnosis not present

## 2020-06-06 DIAGNOSIS — R2689 Other abnormalities of gait and mobility: Secondary | ICD-10-CM

## 2020-06-06 DIAGNOSIS — R296 Repeated falls: Secondary | ICD-10-CM

## 2020-06-06 NOTE — Therapy (Signed)
[facility] Physical Therapy [address] [place], [place], [address] Phone: [phone]   Fax:  [phone]  Physical Therapy Treatment  Patient Details  Name: Ann Sampson MRN: [id number] Date of Birth: 1975-09-24 Referring Provider (PT): Persons, [place], [place]   Encounter Date: 06/06/2020   PT End of Session - 06/06/20 1431    Visit Number 6    Number of Visits 16    Date for PT Re-Evaluation 07/16/20    Authorization Type [employer] OTHER    PT Start Time 1345    PT Stop Time 1435    PT Time Calculation (min) 50 min    Behavior During Therapy [facility] for tasks assessed/performed           Past Medical History:  Diagnosis Date  . Anxiety   . Arthritis   . Asthma   . Complication of anesthesia    with endoscopic spoke profeaty  . Depression   . Family history of adverse reaction to anesthesia    sister- vommitting  . Headache    magraines in the past  . Neuropathy    feet mainly- hands some  . Seasonal allergies     Past Surgical History:  Procedure Laterality Date  . BREAST SURGERY Bilateral    Breast Enhancement  . ESOPHAGOGASTRODUODENOSCOPY     x 3  last one approx 2002  . KNEE SURGERY Right    arthroscopy- cartilage  . ORIF ANKLE FRACTURE Left 04/18/2020   Procedure: OPEN REDUCTION INTERNAL FIXATION (ORIF) SYNDESMOSIS LEFT ANKLE AND LISFRANC JOINT LEFT FOOT;  Surgeon: Newt Minion, MD;  Location: [place];  Service: Orthopedics;  Laterality: Left;    There were no vitals filed for this visit.   Subjective Assessment - 06/06/20 1347    Subjective able to perform eversion a little more now    Limitations Standing;Walking;House hold activities    Patient Stated Goals improve mobility, return to baseline    Currently in Pain? Yes    Pain Score 4     Pain Location Ankle    Pain Orientation Left    Pain Descriptors / Indicators Sore    Pain Type Acute pain;Surgical pain    Pain Onset More than a month ago    Pain  Frequency Intermittent    Aggravating Factors  stress, walking    Pain Relieving Factors elevation              [facility] PT Assessment - 06/06/20 1416      Assessment   Medical Diagnosis Status post ORIF Syndesmosis and Lis Franc fracture    Referring Provider (PT) Persons, [place], [place]    Next MD Visit 06/07/20      Observation/Other Assessments   Observations Lt ankle resting in 5 deg inversion; increased edema noted in Lt foot/ankle      AROM   Left Ankle Dorsiflexion 4    Left Ankle Plantar Flexion 35    Left Ankle Inversion 24    Left Ankle Eversion 12      PROM   Left Ankle Dorsiflexion 9    Left Ankle Plantar Flexion 45                         [facility] Adult PT Treatment/Exercise - 06/06/20 1348      Vasopneumatic   Number Minutes Vasopneumatic  10 minutes    Vasopnuematic Location  Ankle    Vasopneumatic Pressure Low    Vasopneumatic  Temperature  34      Ankle Exercises: Aerobic   Nustep L5 x 8 min      Ankle Exercises: Seated   Ankle Circles/Pumps Left;20 reps    Ankle Circles/Pumps Limitations pumps/circles    BAPS Sitting;Level 2   20 reps, all directions     Ankle Exercises: Stretches   Soleus Stretch --   10 x 10 sec; seated with overpressure     Ankle Exercises: Supine   T-Band PF L3 x 20; Lt                    PT Short Term Goals - 06/06/20 1431      PT SHORT TERM GOAL #1   Title Pt wil be Ind in an initial HEP.    Status On-going    Target Date 06/18/20      PT SHORT TERM GOAL #2   Title improve Lt ankle ROM by 8 degrees all motions for improved function    Status Partially Met    Target Date 06/18/20      PT SHORT TERM GOAL #3   Title amb with single crutch and boot (if still indicated) without increase in pain or significant gait deviations    Status On-going    Target Date 06/18/20      PT SHORT TERM GOAL #4   Title negotiate stairs Mod I in order to return to sleeping in bedroom    Status On-going     Target Date 06/18/20             PT Long Term Goals - 05/28/20 1444      PT LONG TERM GOAL #1   Title independent with final HEP    Baseline 105, 93, 25, 45 respectively    Period Weeks    Status On-going      PT LONG TERM GOAL #2   Title demonstrate Lt ankle ROM to WNL for improved function    Baseline 5/10    Time 4    Period Weeks    Status New      PT LONG TERM GOAL #3   Title demonstrate 3/5 Lt ankle strength for improved function    Baseline Much difficulty    Period Weeks    Status On-going      PT LONG TERM GOAL #4   Title amb without AD independently for improved function    Baseline 33 % limitation    Status On-going      PT LONG TERM GOAL #5   Title FOTO score improved to 58    Status On-going                 Plan - 06/06/20 1432    Clinical Impression Statement Pt demonstrating improved AROM today and overall increased muscle activation of all Lt ankle muscles.  Will continue to benefit from PT to maximize function.    Personal Factors and Comorbidities Comorbidity 1    Comorbidities arthritis    Examination-Activity Limitations Bathing;Squat;Stairs;Stand;Transfers;Locomotion Level;Lift    Stability/Clinical Decision Making Evolving/Moderate complexity    Rehab Potential Good    PT Frequency 2x / week    PT Duration 8 weeks    PT Treatment/Interventions ADLs/Self Care Home Management;Cryotherapy;Electrical Stimulation;Iontophoresis 59m/ml Dexamethasone;Moist Heat;Ultrasound;Therapeutic exercise;Therapeutic activities;Patient/family education;Manual techniques;Dry needling;Taping;Vasopneumatic Device;Balance training;Functional mobility training;Stair training;Gait training;DME Instruction;Neuromuscular re-education;Passive range of motion;Scar mobilization    PT Next Visit Plan needs stair training (currently sleeping downstairs), Lt ankle AROM/strength/PROM, gait training with single  crutch    PT Home Exercise Plan Access Code: [email or web address]     Consulted and Agree with Plan of Care Patient           Patient will benefit from skilled therapeutic intervention in order to improve the following deficits and impairments:  Decreased range of motion, Abnormal gait, Decreased endurance, Increased edema, Decreased scar mobility, Decreased knowledge of precautions, Decreased knowledge of use of DME, Decreased activity tolerance, Decreased mobility, Decreased balance, Difficulty walking, Decreased strength  Visit Diagnosis: Pain in left ankle and joints of left foot  Stiffness of left ankle, not elsewhere classified  Muscle weakness (generalized)  Other abnormalities of gait and mobility  Localized edema  Repeated falls     Problem List Patient Active Problem List   Diagnosis Date Noted  . Lisfranc dislocation, left, sequela   . Ankle syndesmosis disruption, left, sequela   . Generalized anxiety disorder 11/03/2017  . Neuropathy 11/04/2016  . Paresthesias 11/04/2016      Laureen Abrahams, PT, DPT 06/06/20 2:35 PM     [facility] [address] [place], [place], [address] Phone: [phone]   Fax:  [phone]  Name: Ann Sampson MRN: [id number] Date of Birth: Jul 13, 1975

## 2020-06-07 ENCOUNTER — Encounter: Payer: Self-pay | Admitting: Physician Assistant

## 2020-06-07 ENCOUNTER — Ambulatory Visit (INDEPENDENT_AMBULATORY_CARE_PROVIDER_SITE_OTHER): Payer: 59 | Admitting: Physician Assistant

## 2020-06-07 VITALS — Ht 65.0 in | Wt 195.0 lb

## 2020-06-07 DIAGNOSIS — S93325S Dislocation of tarsometatarsal joint of left foot, sequela: Secondary | ICD-10-CM

## 2020-06-07 NOTE — Progress Notes (Signed)
Office Visit Note   Patient: Ann Sampson           Date of Birth: 09-14-1975           MRN: [id number] Visit Date: 06/07/2020              Requested by: Shade Flood, MD [address] [place],  [place] [address] PCP: Shade Flood, MD  Chief Complaint  Patient presents with  . Left Ankle - Routine Post Op    04/18/20 ORIF left ankle  . Left Foot - Routine Post Op    04/18/20 ORIF Lisfranc joint       HPI: Patient presents today she is 7 weeks status post ORIF of left ankle syndesmosis and Lisfranc injury.  She has been working with physical therapy.  She does feel significantly improved.  Assessment & Plan: Visit Diagnoses: No diagnosis found.  Plan: Patient may wean out of her boot as tolerated continue working with PT I did recommend a pair of VIVE compression stockings at next visit x-rays of her ankle and foot should be taken  Follow-Up Instructions: No follow-ups on file.   Ortho Exam  Patient is alert, oriented, no adenopathy, well-dressed, normal affect, normal respiratory effort. Focused examination demonstrates well-healed surgical incision.  Toes are warm and pink with brisk capillary refill compartments are soft and compressible.  Mild soft tissue swelling.  No signs of cellulitis or infection  Imaging: No results found. No images are attached to the encounter.  Labs: Lab Results  Component Value Date   ESRSEDRATE 5 10/07/2017   ESRSEDRATE 2 02/26/2016   CRP 5.5 (H) 10/07/2017   LABURIC 4.0 10/07/2017   LABURIC 4.8 08/18/2013     Lab Results  Component Value Date   ALBUMIN 4.4 10/07/2017   ALBUMIN 4.6 07/10/2014   ALBUMIN 4.8 08/18/2013   LABURIC 4.0 10/07/2017   LABURIC 4.8 08/18/2013    No results found for: MG Lab Results  Component Value Date   VD25OH 45.1 02/26/2016    No results found for: PREALBUMIN CBC EXTENDED Latest Ref Rng & Units 04/18/2020 10/07/2017 09/25/2016  WBC 4.0 - 10.5 K/uL 5.4 5.2 6.8  RBC 3.87 - 5.11  MIL/uL 5.08 4.61 4.75  HGB 12.0 - 15.0 g/dL 16.2(H) 14.4 15.3  HCT 36.0 - 46.0 % 48.3(H) 42.3 43.6  PLT 150 - 400 K/uL 287 267 -  NEUTROABS 1.4 - 7.0 x10E3/uL - 2.8 -  LYMPHSABS 0.7 - 3.1 x10E3/uL - 2.1 -     Body mass index is 32.45 kg/m.  Orders:  No orders of the defined types were placed in this encounter.  No orders of the defined types were placed in this encounter.    Procedures: No procedures performed  Clinical Data: No additional findings.  ROS:  All other systems negative, except as noted in the HPI. Review of Systems  Objective: Vital Signs: Ht 5\' 5"  (1.651 m)   Wt 195 lb (88.5 kg)   BMI 32.45 kg/m   Specialty Comments:  No specialty comments available.  PMFS History: Patient Active Problem List   Diagnosis Date Noted  . Lisfranc dislocation, left, sequela   . Ankle syndesmosis disruption, left, sequela   . Generalized anxiety disorder 11/03/2017  . Neuropathy 11/04/2016  . Paresthesias 11/04/2016   Past Medical History:  Diagnosis Date  . Anxiety   . Arthritis   . Asthma   . Complication of anesthesia    with endoscopic spoke profeaty  . Depression   .  Family history of adverse reaction to anesthesia    sister- vommitting  . Headache    magraines in the past  . Neuropathy    feet mainly- hands some  . Seasonal allergies     Family History  Problem Relation Age of Onset  . Skin cancer Mother   . Asthma Son   . Seizures Maternal Grandmother   . Cancer Maternal Grandmother   . Stroke Maternal Grandmother   . Neuropathy Maternal Grandfather   . Diabetic kidney disease Maternal Grandfather   . Diabetes Maternal Grandfather   . Heart disease Paternal Grandfather   . Hyperlipidemia Paternal Grandfather   . Hypertension Paternal Grandfather     Past Surgical History:  Procedure Laterality Date  . BREAST SURGERY Bilateral    Breast Enhancement  . ESOPHAGOGASTRODUODENOSCOPY     x 3  last one approx 2002  . KNEE SURGERY Right     arthroscopy- cartilage  . ORIF ANKLE FRACTURE Left 04/18/2020   Procedure: OPEN REDUCTION INTERNAL FIXATION (ORIF) SYNDESMOSIS LEFT ANKLE AND LISFRANC JOINT LEFT FOOT;  Surgeon: Nadara Mustard, MD;  [facility];  Service: Orthopedics;  Laterality: Left;   Social History   Occupational History  . Not on file  Tobacco Use  . Smoking status: Former Games developer  . Smokeless tobacco: Never Used  Substance and Sexual Activity  . Alcohol use: Yes    Alcohol/week: 1.0 standard drink    Types: 1 Glasses of wine per week    Comment: occcasionally  . Drug use: No  . Sexual activity: Not on file

## 2020-06-11 ENCOUNTER — Encounter: Payer: Self-pay | Admitting: Physical Therapy

## 2020-06-11 ENCOUNTER — Other Ambulatory Visit: Payer: Self-pay

## 2020-06-11 ENCOUNTER — Ambulatory Visit: Payer: 59 | Admitting: Physical Therapy

## 2020-06-11 DIAGNOSIS — R600 Localized edema: Secondary | ICD-10-CM

## 2020-06-11 DIAGNOSIS — M25672 Stiffness of left ankle, not elsewhere classified: Secondary | ICD-10-CM

## 2020-06-11 DIAGNOSIS — R296 Repeated falls: Secondary | ICD-10-CM

## 2020-06-11 DIAGNOSIS — R2689 Other abnormalities of gait and mobility: Secondary | ICD-10-CM | POA: Diagnosis not present

## 2020-06-11 DIAGNOSIS — M6281 Muscle weakness (generalized): Secondary | ICD-10-CM | POA: Diagnosis not present

## 2020-06-11 DIAGNOSIS — M25572 Pain in left ankle and joints of left foot: Secondary | ICD-10-CM

## 2020-06-11 NOTE — Therapy (Signed)
[facility] [address] [place], [place], [address] Phone: [phone]   Fax:  [phone]  Physical Therapy Treatment  Patient Details  Name: Ann Sampson MRN: [id number] Date of Birth: 09-28-1975 Referring Provider (PT): Persons, Bevely Palmer, [place]   Encounter Date: 06/11/2020   PT End of Session - 06/11/20 1603    Visit Number 7    Number of Visits 16    Date for PT Re-Evaluation 07/16/20    Authorization Type [employer] OTHER    PT Start Time 1515    PT Stop Time 1600    PT Time Calculation (min) 45 min    Behavior During Therapy [facility] for tasks assessed/performed           Past Medical History:  Diagnosis Date  . Anxiety   . Arthritis   . Asthma   . Complication of anesthesia    with endoscopic spoke profeaty  . Depression   . Family history of adverse reaction to anesthesia    sister- vommitting  . Headache    magraines in the past  . Neuropathy    feet mainly- hands some  . Seasonal allergies     Past Surgical History:  Procedure Laterality Date  . BREAST SURGERY Bilateral    Breast Enhancement  . ESOPHAGOGASTRODUODENOSCOPY     x 3  last one approx 2002  . KNEE SURGERY Right    arthroscopy- cartilage  . ORIF ANKLE FRACTURE Left 04/18/2020   Procedure: OPEN REDUCTION INTERNAL FIXATION (ORIF) SYNDESMOSIS LEFT ANKLE AND LISFRANC JOINT LEFT FOOT;  Surgeon: Newt Minion, MD;  Location: [place];  Service: Orthopedics;  Laterality: Left;    There were no vitals filed for this visit.   Subjective Assessment - 06/11/20 1518    Subjective good report from the follow up visit, now able to transition from boot to shoe; has done stairs twice so far    Limitations Standing;Walking;House hold activities    Patient Stated Goals improve mobility, return to baseline    Currently in Pain? Yes    Pain Score 4    2-6/10   Pain Location Ankle    Pain Orientation Left    Pain Descriptors / Indicators Sore;Aching    Pain  Type Acute pain;Surgical pain    Pain Onset More than a month ago    Pain Frequency Intermittent    Aggravating Factors  stress, walking    Pain Relieving Factors elevation                             [facility] Adult PT Treatment/Exercise - 06/11/20 1521      Ambulation/Gait   Pre-Gait Activities in shoe: lateral weight shifting x 3 min; staggered stance weight shifting x 3 min each foot forward      Vasopneumatic   Number Minutes Vasopneumatic  10 minutes    Vasopnuematic Location  Ankle    Vasopneumatic Pressure Low    Vasopneumatic Temperature  34      Ankle Exercises: Aerobic   Nustep L5 x 8 min      Ankle Exercises: Stretches   Slant Board Stretch 3 reps;30 seconds                    PT Short Term Goals - 06/06/20 1431      PT SHORT TERM GOAL #1   Title Pt wil be Ind in an initial HEP.  Status On-going    Target Date 06/18/20      PT SHORT TERM GOAL #2   Title improve Lt ankle ROM by 8 degrees all motions for improved function    Status Partially Met    Target Date 06/18/20      PT SHORT TERM GOAL #3   Title amb with single crutch and boot (if still indicated) without increase in pain or significant gait deviations    Status On-going    Target Date 06/18/20      PT SHORT TERM GOAL #4   Title negotiate stairs Mod I in order to return to sleeping in bedroom    Status On-going    Target Date 06/18/20             PT Long Term Goals - 05/28/20 1444      PT LONG TERM GOAL #1   Title independent with final HEP    Baseline 105, 93, 25, 45 respectively    Period Weeks    Status On-going      PT LONG TERM GOAL #2   Title demonstrate Lt ankle ROM to WNL for improved function    Baseline 5/10    Time 4    Period Weeks    Status New      PT LONG TERM GOAL #3   Title demonstrate 3/5 Lt ankle strength for improved function    Baseline Much difficulty    Period Weeks    Status On-going      PT LONG TERM GOAL #4   Title amb  without AD independently for improved function    Baseline 33 % limitation    Status On-going      PT LONG TERM GOAL #5   Title FOTO score improved to 58    Status On-going                 Plan - 06/11/20 1603    Clinical Impression Statement Pt tolerated session well today with pre gait activities in shoe today.  Will continue to benefit from PT to maximize function.    Personal Factors and Comorbidities Comorbidity 1    Comorbidities arthritis    Examination-Activity Limitations Bathing;Squat;Stairs;Stand;Transfers;Locomotion Level;Lift    Stability/Clinical Decision Making Evolving/Moderate complexity    Rehab Potential Good    PT Frequency 2x / week    PT Duration 8 weeks    PT Treatment/Interventions ADLs/Self Care Home Management;Cryotherapy;Electrical Stimulation;Iontophoresis 18m/ml Dexamethasone;Moist Heat;Ultrasound;Therapeutic exercise;Therapeutic activities;Patient/family education;Manual techniques;Dry needling;Taping;Vasopneumatic Device;Balance training;Functional mobility training;Stair training;Gait training;DME Instruction;Neuromuscular re-education;Passive range of motion;Scar mobilization    PT Next Visit Plan needs stair training (currently sleeping downstairs), Lt ankle AROM/strength/PROM, gait training with single crutch, pre gait activities    PT Home Exercise Plan Access Code: [email or web address]   Consulted and Agree with Plan of Care Patient           Patient will benefit from skilled therapeutic intervention in order to improve the following deficits and impairments:  Decreased range of motion,Abnormal gait,Decreased endurance,Increased edema,Decreased scar mobility,Decreased knowledge of precautions,Decreased knowledge of use of DME,Decreased activity tolerance,Decreased mobility,Decreased balance,Difficulty walking,Decreased strength  Visit Diagnosis: Pain in left ankle and joints of left foot  Stiffness of left ankle, not elsewhere classified  Muscle  weakness (generalized)  Other abnormalities of gait and mobility  Localized edema  Repeated falls     Problem List Patient Active Problem List   Diagnosis Date Noted  . Lisfranc dislocation, left, sequela   . Ankle syndesmosis disruption,  left, sequela   . Generalized anxiety disorder 11/03/2017  . Neuropathy 11/04/2016  . Paresthesias 11/04/2016      Laureen Abrahams, PT, DPT 06/11/20 4:05 PM    [facility] [address] [place], [place], [address] Phone: [phone]   Fax:  [phone]  Name: Ann Sampson MRN: [id number] Date of Birth: 1976-01-07

## 2020-06-13 ENCOUNTER — Telehealth: Payer: Self-pay

## 2020-06-13 NOTE — Telephone Encounter (Signed)
Called and lm on vm to advise to call and let me know if she can come in today for ankle brace.

## 2020-06-13 NOTE — Telephone Encounter (Signed)
-----   Message from Eustace Quail sent at 06/11/2020  4:09 PM EST ----- PT was here for PT today, she would like an ankle brace. Please call her when ready for pick up.

## 2020-06-14 ENCOUNTER — Other Ambulatory Visit: Payer: Self-pay

## 2020-06-14 ENCOUNTER — Ambulatory Visit: Payer: 59 | Admitting: Physical Therapy

## 2020-06-14 ENCOUNTER — Encounter: Payer: Self-pay | Admitting: Physical Therapy

## 2020-06-14 DIAGNOSIS — M6281 Muscle weakness (generalized): Secondary | ICD-10-CM | POA: Diagnosis not present

## 2020-06-14 DIAGNOSIS — M25572 Pain in left ankle and joints of left foot: Secondary | ICD-10-CM

## 2020-06-14 DIAGNOSIS — R600 Localized edema: Secondary | ICD-10-CM

## 2020-06-14 DIAGNOSIS — R2689 Other abnormalities of gait and mobility: Secondary | ICD-10-CM | POA: Diagnosis not present

## 2020-06-14 DIAGNOSIS — R296 Repeated falls: Secondary | ICD-10-CM

## 2020-06-14 DIAGNOSIS — M25672 Stiffness of left ankle, not elsewhere classified: Secondary | ICD-10-CM | POA: Diagnosis not present

## 2020-06-14 NOTE — Telephone Encounter (Signed)
Pt will pick up today while in physical therapy.

## 2020-06-14 NOTE — Therapy (Signed)
[facility] [address] [place], [place], [address] Phone: [phone]   Fax:  [phone]  Physical Therapy Treatment  Patient Details  Name: Ann Sampson MRN: [id number] Date of Birth: May 05, 1976 Referring Provider (PT): Persons, Bevely Palmer, [place]   Encounter Date: 06/14/2020   PT End of Session - 06/14/20 1531    Visit Number 8    Number of Visits 16    Date for PT Re-Evaluation 07/16/20    Authorization Type [employer] OTHER    PT Start Time 1430    PT Stop Time 1515    PT Time Calculation (min) 45 min    Behavior During Therapy [facility] for tasks assessed/performed           Past Medical History:  Diagnosis Date  . Anxiety   . Arthritis   . Asthma   . Complication of anesthesia    with endoscopic spoke profeaty  . Depression   . Family history of adverse reaction to anesthesia    sister- vommitting  . Headache    magraines in the past  . Neuropathy    feet mainly- hands some  . Seasonal allergies     Past Surgical History:  Procedure Laterality Date  . BREAST SURGERY Bilateral    Breast Enhancement  . ESOPHAGOGASTRODUODENOSCOPY     x 3  last one approx 2002  . KNEE SURGERY Right    arthroscopy- cartilage  . ORIF ANKLE FRACTURE Left 04/18/2020   Procedure: OPEN REDUCTION INTERNAL FIXATION (ORIF) SYNDESMOSIS LEFT ANKLE AND LISFRANC JOINT LEFT FOOT;  Surgeon: Newt Minion, MD;  Location: [place];  Service: Orthopedics;  Laterality: Left;    There were no vitals filed for this visit.   Subjective Assessment - 06/14/20 1432    Subjective got ASO to wear for safety    Limitations Standing;Walking;House hold activities    Patient Stated Goals improve mobility, return to baseline    Currently in Pain? Yes    Pain Score 0-No pain    Pain Location Ankle    Pain Orientation Left    Pain Descriptors / Indicators Aching;Sore    Pain Type Acute pain;Chronic pain    Pain Onset More than a month ago    Pain Frequency  Intermittent    Aggravating Factors  stress, walking    Pain Relieving Factors elevation                             [facility] Adult PT Treatment/Exercise - 06/14/20 1439      Ambulation/Gait   Gait Comments amb with intermittent use of single axillary crutch with shoe and ASO: supervision with decreased stance on LLE: amb indoors and level outdoor surfaces      Ankle Exercises: Aerobic   Recumbent Bike no resistance x 8 min      Ankle Exercises: Stretches   Slant Board Stretch 3 reps;30 seconds      Ankle Exercises: Standing   Other Standing Ankle Exercises step down with LLE on step 2x10; 4" step      Ankle Exercises: Machines for Strengthening   Cybex Leg Press LLE only 3x10; 50#; calf raises with 50# 3x10                  PT Education - 06/14/20 1531    Education Details safe to amb level surfaces with ASO/shoe and single crutch    Person(s) Educated Patient  Methods Explanation    Comprehension Verbalized understanding            PT Short Term Goals - 06/06/20 1431      PT SHORT TERM GOAL #1   Title Pt wil be Ind in an initial HEP.    Status On-going    Target Date 06/18/20      PT SHORT TERM GOAL #2   Title improve Lt ankle ROM by 8 degrees all motions for improved function    Status Partially Met    Target Date 06/18/20      PT SHORT TERM GOAL #3   Title amb with single crutch and boot (if still indicated) without increase in pain or significant gait deviations    Status On-going    Target Date 06/18/20      PT SHORT TERM GOAL #4   Title negotiate stairs Mod I in order to return to sleeping in bedroom    Status On-going    Target Date 06/18/20             PT Long Term Goals - 05/28/20 1444      PT LONG TERM GOAL #1   Title independent with final HEP    Baseline 105, 93, 25, 45 respectively    Period Weeks    Status On-going      PT LONG TERM GOAL #2   Title demonstrate Lt ankle ROM to WNL for improved function     Baseline 5/10    Time 4    Period Weeks    Status New      PT LONG TERM GOAL #3   Title demonstrate 3/5 Lt ankle strength for improved function    Baseline Much difficulty    Period Weeks    Status On-going      PT LONG TERM GOAL #4   Title amb without AD independently for improved function    Baseline 33 % limitation    Status On-going      PT LONG TERM GOAL #5   Title FOTO score improved to 58    Status On-going                 Plan - 06/14/20 1532    Clinical Impression Statement Pt demonstrated safe mobility with shoe and ASO with single axillary crutch today on level indoor and paved outdoor surfaces.  She's progressing well overall with PT, and hopeful to wean from boot over next few weeks.  Will continue to benefit from PT to maximize function.    Personal Factors and Comorbidities Comorbidity 1    Comorbidities arthritis    Examination-Activity Limitations Bathing;Squat;Stairs;Stand;Transfers;Locomotion Level;Lift    Stability/Clinical Decision Making Evolving/Moderate complexity    Rehab Potential Good    PT Frequency 2x / week    PT Duration 8 weeks    PT Treatment/Interventions ADLs/Self Care Home Management;Cryotherapy;Electrical Stimulation;Iontophoresis 34m/ml Dexamethasone;Moist Heat;Ultrasound;Therapeutic exercise;Therapeutic activities;Patient/family education;Manual techniques;Dry needling;Taping;Vasopneumatic Device;Balance training;Functional mobility training;Stair training;Gait training;DME Instruction;Neuromuscular re-education;Passive range of motion;Scar mobilization    PT Next Visit Plan needs stair training (currently sleeping downstairs), Lt ankle AROM/strength/PROM, gait training with single crutch, pre gait activities; check STGs    PT Home Exercise Plan Access Code: [email or web address]   Consulted and Agree with Plan of Care Patient           Patient will benefit from skilled therapeutic intervention in order to improve the following deficits and  impairments:  Decreased range of motion,Abnormal gait,Decreased endurance,Increased edema,Decreased scar mobility,Decreased knowledge  of precautions,Decreased knowledge of use of DME,Decreased activity tolerance,Decreased mobility,Decreased balance,Difficulty walking,Decreased strength  Visit Diagnosis: Pain in left ankle and joints of left foot  Stiffness of left ankle, not elsewhere classified  Muscle weakness (generalized)  Other abnormalities of gait and mobility  Localized edema  Repeated falls     Problem List Patient Active Problem List   Diagnosis Date Noted  . Lisfranc dislocation, left, sequela   . Ankle syndesmosis disruption, left, sequela   . Generalized anxiety disorder 11/03/2017  . Neuropathy 11/04/2016  . Paresthesias 11/04/2016     Laureen Abrahams, PT, DPT 06/14/20 3:34 PM    [facility] [address] [place], [place], [address] Phone: [phone]   Fax:  [phone]  Name: Ann Sampson MRN: [id number] Date of Birth: Jun 16, 1976

## 2020-06-18 ENCOUNTER — Encounter: Payer: Self-pay | Admitting: Physical Therapy

## 2020-06-18 ENCOUNTER — Ambulatory Visit: Payer: 59 | Admitting: Physical Therapy

## 2020-06-18 ENCOUNTER — Other Ambulatory Visit: Payer: Self-pay

## 2020-06-18 DIAGNOSIS — M6281 Muscle weakness (generalized): Secondary | ICD-10-CM | POA: Diagnosis not present

## 2020-06-18 DIAGNOSIS — R2689 Other abnormalities of gait and mobility: Secondary | ICD-10-CM

## 2020-06-18 DIAGNOSIS — R296 Repeated falls: Secondary | ICD-10-CM

## 2020-06-18 DIAGNOSIS — M25672 Stiffness of left ankle, not elsewhere classified: Secondary | ICD-10-CM | POA: Diagnosis not present

## 2020-06-18 DIAGNOSIS — M25572 Pain in left ankle and joints of left foot: Secondary | ICD-10-CM | POA: Diagnosis not present

## 2020-06-18 DIAGNOSIS — R600 Localized edema: Secondary | ICD-10-CM

## 2020-06-18 NOTE — Therapy (Signed)
[facility] [address] [place], [place], [address] Phone: [phone]   Fax:  [phone]  Physical Therapy Treatment  Patient Details  Name: Ann Sampson MRN: [id number] Date of Birth: 08/13/1975 Referring Provider (PT): Persons, Bevely Palmer, [place]   Encounter Date: 06/18/2020   PT End of Session - 06/18/20 1507    Visit Number 9    Number of Visits 16    Date for PT Re-Evaluation 07/16/20    Authorization Type [employer] OTHER    PT Start Time 1430    PT Stop Time 1510    PT Time Calculation (min) 40 min    Behavior During Therapy [facility] for tasks assessed/performed           Past Medical History:  Diagnosis Date  . Anxiety   . Arthritis   . Asthma   . Complication of anesthesia    with endoscopic spoke profeaty  . Depression   . Family history of adverse reaction to anesthesia    sister- vommitting  . Headache    magraines in the past  . Neuropathy    feet mainly- hands some  . Seasonal allergies     Past Surgical History:  Procedure Laterality Date  . BREAST SURGERY Bilateral    Breast Enhancement  . ESOPHAGOGASTRODUODENOSCOPY     x 3  last one approx 2002  . KNEE SURGERY Right    arthroscopy- cartilage  . ORIF ANKLE FRACTURE Left 04/18/2020   Procedure: OPEN REDUCTION INTERNAL FIXATION (ORIF) SYNDESMOSIS LEFT ANKLE AND LISFRANC JOINT LEFT FOOT;  Surgeon: Newt Minion, MD;  Location: [place];  Service: Orthopedics;  Laterality: Left;    There were no vitals filed for this visit.   Subjective Assessment - 06/18/20 1434    Subjective arrived today in shoe and ASO, doing well.    Limitations Standing;Walking;House hold activities    Patient Stated Goals improve mobility, return to baseline    Currently in Pain? Yes    Pain Score 2     Pain Location Ankle    Pain Orientation Left    Pain Descriptors / Indicators Aching;Sore    Pain Type Acute pain;Chronic pain    Pain Onset More than a month ago    Pain  Frequency Intermittent    Aggravating Factors  walking    Pain Relieving Factors elevation                             [facility] Adult PT Treatment/Exercise - 06/18/20 1435      Ankle Exercises: Aerobic   Nustep L6 x 8 min      Ankle Exercises: Standing   Heel Raises Both;20 reps    Braiding (Round Trip) forward step overs x 3 reps    Other Standing Ankle Exercises tandem stand 3x30 sec bil; tandem walking forward/backward with 1UE support x 3 reps; sidestepping x 3 reps                    PT Short Term Goals - 06/06/20 1431      PT SHORT TERM GOAL #1   Title Pt wil be Ind in an initial HEP.    Status On-going    Target Date 06/18/20      PT SHORT TERM GOAL #2   Title improve Lt ankle ROM by 8 degrees all motions for improved function    Status Partially Met    Target  Date 06/18/20      PT SHORT TERM GOAL #3   Title amb with single crutch and boot (if still indicated) without increase in pain or significant gait deviations    Status On-going    Target Date 06/18/20      PT SHORT TERM GOAL #4   Title negotiate stairs Mod I in order to return to sleeping in bedroom    Status On-going    Target Date 06/18/20             PT Long Term Goals - 05/28/20 1444      PT LONG TERM GOAL #1   Title independent with final HEP    Baseline 105, 93, 25, 45 respectively    Period Weeks    Status On-going      PT LONG TERM GOAL #2   Title demonstrate Lt ankle ROM to WNL for improved function    Baseline 5/10    Time 4    Period Weeks    Status New      PT LONG TERM GOAL #3   Title demonstrate 3/5 Lt ankle strength for improved function    Baseline Much difficulty    Period Weeks    Status On-going      PT LONG TERM GOAL #4   Title amb without AD independently for improved function    Baseline 33 % limitation    Status On-going      PT LONG TERM GOAL #5   Title FOTO score improved to 58    Status On-going                 Plan -  06/18/20 1508    Clinical Impression Statement Pt tolerated session well today with focus on balance and strengthening without ASO today.  Will continue to benefit from PT to maximize function.    Personal Factors and Comorbidities Comorbidity 1    Comorbidities arthritis    Examination-Activity Limitations Bathing;Squat;Stairs;Stand;Transfers;Locomotion Level;Lift    Stability/Clinical Decision Making Evolving/Moderate complexity    Rehab Potential Good    PT Frequency 2x / week    PT Duration 8 weeks    PT Treatment/Interventions ADLs/Self Care Home Management;Cryotherapy;Electrical Stimulation;Iontophoresis 59m/ml Dexamethasone;Moist Heat;Ultrasound;Therapeutic exercise;Therapeutic activities;Patient/family education;Manual techniques;Dry needling;Taping;Vasopneumatic Device;Balance training;Functional mobility training;Stair training;Gait training;DME Instruction;Neuromuscular re-education;Passive range of motion;Scar mobilization    PT Next Visit Plan needs stair training (currently sleeping downstairs), Lt ankle AROM/strength/PROM, gait training with single crutch, pre gait activities; check STGs    PT Home Exercise Plan Access Code: [email or web address]   Consulted and Agree with Plan of Care Patient           Patient will benefit from skilled therapeutic intervention in order to improve the following deficits and impairments:  Decreased range of motion,Abnormal gait,Decreased endurance,Increased edema,Decreased scar mobility,Decreased knowledge of precautions,Decreased knowledge of use of DME,Decreased activity tolerance,Decreased mobility,Decreased balance,Difficulty walking,Decreased strength  Visit Diagnosis: Pain in left ankle and joints of left foot  Stiffness of left ankle, not elsewhere classified  Muscle weakness (generalized)  Other abnormalities of gait and mobility  Localized edema  Repeated falls     Problem List Patient Active Problem List   Diagnosis Date Noted  .  Lisfranc dislocation, left, sequela   . Ankle syndesmosis disruption, left, sequela   . Generalized anxiety disorder 11/03/2017  . Neuropathy 11/04/2016  . Paresthesias 11/04/2016      SLaureen Abrahams PT, DPT 06/18/20 3:09 PM    [facility] [phone]  [place], [place], [address] Phone: [phone]   Fax:  [phone]  Name: Ann Sampson MRN: [id number] Date of Birth: 10/03/1975

## 2020-06-20 ENCOUNTER — Ambulatory Visit (INDEPENDENT_AMBULATORY_CARE_PROVIDER_SITE_OTHER): Payer: 59 | Admitting: Physical Therapy

## 2020-06-20 ENCOUNTER — Other Ambulatory Visit: Payer: Self-pay

## 2020-06-20 ENCOUNTER — Encounter: Payer: Self-pay | Admitting: Physical Therapy

## 2020-06-20 DIAGNOSIS — M25672 Stiffness of left ankle, not elsewhere classified: Secondary | ICD-10-CM | POA: Diagnosis not present

## 2020-06-20 DIAGNOSIS — M6281 Muscle weakness (generalized): Secondary | ICD-10-CM

## 2020-06-20 DIAGNOSIS — R2689 Other abnormalities of gait and mobility: Secondary | ICD-10-CM

## 2020-06-20 DIAGNOSIS — M25572 Pain in left ankle and joints of left foot: Secondary | ICD-10-CM | POA: Diagnosis not present

## 2020-06-20 DIAGNOSIS — R296 Repeated falls: Secondary | ICD-10-CM

## 2020-06-20 DIAGNOSIS — R600 Localized edema: Secondary | ICD-10-CM

## 2020-06-20 NOTE — Therapy (Signed)
[facility] [address] [place], [place], [address] Phone: [phone]   Fax:  [phone]  Physical Therapy Treatment  Patient Details  Name: Ann Sampson MRN: [id number] Date of Birth: [birth year] Referring Provider (PT): Persons, Bevely Palmer, [place]   Encounter Date: 06/20/2020   PT End of Session - 06/20/20 1556    Visit Number 10    Number of Visits 16    Date for PT Re-Evaluation 07/16/20    Authorization Type [employer] OTHER    PT Start Time 1515    PT Stop Time 1600    PT Time Calculation (min) 45 min    Behavior During Therapy [facility] for tasks assessed/performed           Past Medical History:  Diagnosis Date  . Anxiety   . Arthritis   . Asthma   . Complication of anesthesia    with endoscopic spoke profeaty  . Depression   . Family history of adverse reaction to anesthesia    sister- vommitting  . Headache    magraines in the past  . Neuropathy    feet mainly- hands some  . Seasonal allergies     Past Surgical History:  Procedure Laterality Date  . BREAST SURGERY Bilateral    Breast Enhancement  . ESOPHAGOGASTRODUODENOSCOPY     x 3  last one approx 2002  . KNEE SURGERY Right    arthroscopy- cartilage  . ORIF ANKLE FRACTURE Left 04/18/2020   Procedure: OPEN REDUCTION INTERNAL FIXATION (ORIF) SYNDESMOSIS LEFT ANKLE AND LISFRANC JOINT LEFT FOOT;  Surgeon: Newt Minion, MD;  Location: [place];  Service: Orthopedics;  Laterality: Left;    There were no vitals filed for this visit.   Subjective Assessment - 06/20/20 1521    Subjective rode [employer] this morning, so ankle is a little sore today    Limitations Standing;Walking;House hold activities    Patient Stated Goals improve mobility, return to baseline    Currently in Pain? Yes    Pain Score 5     Pain Location Ankle   arch of foot   Pain Orientation Left    Pain Descriptors / Indicators Sharp    Pain Type Acute pain;Chronic pain    Pain Onset More  than a month ago    Pain Frequency Intermittent    Aggravating Factors  walking    Pain Relieving Factors elevation              [facility] PT Assessment - 06/20/20 1531      Assessment   Medical Diagnosis Status post ORIF Syndesmosis and Lis Franc fracture    Referring Provider (PT) Persons, Bevely Palmer, [place]      AROM   Left Ankle Dorsiflexion 4    Left Ankle Plantar Flexion 38    Left Ankle Inversion 25    Left Ankle Eversion 15                         [facility] Adult PT Treatment/Exercise - 06/20/20 1523      Vasopneumatic   Number Minutes Vasopneumatic  10 minutes    Vasopnuematic Location  Ankle    Vasopneumatic Pressure Medium    Vasopneumatic Temperature  34      Ankle Exercises: Aerobic   Recumbent Bike L2 x 8 min      Ankle Exercises: Standing   SLS LLE 3x10 sec    Heel Raises Both;20 reps    Other  Standing Ankle Exercises tandem stand 3x30 sec bil      Ankle Exercises: Stretches   Soleus Stretch 3 reps;30 seconds    Gastroc Stretch 3 reps;30 seconds                    PT Short Term Goals - 06/20/20 1556      PT SHORT TERM GOAL #1   Title Pt wil be Ind in an initial HEP.    Status Achieved    Target Date 06/18/20      PT SHORT TERM GOAL #2   Title improve Lt ankle ROM by 8 degrees all motions for improved function    Status Achieved    Target Date 06/18/20      PT SHORT TERM GOAL #3   Title amb with single crutch and boot (if still indicated) without increase in pain or significant gait deviations    Status Achieved    Target Date 06/18/20      PT SHORT TERM GOAL #4   Title negotiate stairs Mod I in order to return to sleeping in bedroom    Baseline 12/22: doing at home, still feels unsafe, supervision in clinic    Status Partially Met    Target Date 06/18/20             PT Long Term Goals - 05/28/20 1444      PT LONG TERM GOAL #1   Title independent with final HEP    Baseline 105, 93, 25, 45 respectively    Period  Weeks    Status On-going      PT LONG TERM GOAL #2   Title demonstrate Lt ankle ROM to WNL for improved function    Baseline 5/10    Time 4    Period Weeks    Status New      PT LONG TERM GOAL #3   Title demonstrate 3/5 Lt ankle strength for improved function    Baseline Much difficulty    Period Weeks    Status On-going      PT LONG TERM GOAL #4   Title amb without AD independently for improved function    Baseline 33 % limitation    Status On-going      PT LONG TERM GOAL #5   Title FOTO score improved to 58    Status On-going                 Plan - 06/20/20 1557    Clinical Impression Statement Progressing well meeting/partially meeting all STGs today.  Still with some mild gait deviations, but now amb with ASO and SPC into clinic with boot use PRN.  Will continue to benefit from PT to maximize function.    Personal Factors and Comorbidities Comorbidity 1    Comorbidities arthritis    Examination-Activity Limitations Bathing;Squat;Stairs;Stand;Transfers;Locomotion Level;Lift    Stability/Clinical Decision Making Evolving/Moderate complexity    Rehab Potential Good    PT Frequency 2x / week    PT Duration 8 weeks    PT Treatment/Interventions ADLs/Self Care Home Management;Cryotherapy;Electrical Stimulation;Iontophoresis 96m/ml Dexamethasone;Moist Heat;Ultrasound;Therapeutic exercise;Therapeutic activities;Patient/family education;Manual techniques;Dry needling;Taping;Vasopneumatic Device;Balance training;Functional mobility training;Stair training;Gait training;DME Instruction;Neuromuscular re-education;Passive range of motion;Scar mobilization    PT Next Visit Plan needs stair training (currently sleeping downstairs), Lt ankle AROM/strength/PROM, gait training with single crutch, pre gait activities; check FOTO    PT Home Exercise Plan Access Code: [email or web address]   Consulted and Agree with Plan of Care Patient  Patient will benefit from skilled therapeutic  intervention in order to improve the following deficits and impairments:  Decreased range of motion,Abnormal gait,Decreased endurance,Increased edema,Decreased scar mobility,Decreased knowledge of precautions,Decreased knowledge of use of DME,Decreased activity tolerance,Decreased mobility,Decreased balance,Difficulty walking,Decreased strength  Visit Diagnosis: Pain in left ankle and joints of left foot  Stiffness of left ankle, not elsewhere classified  Muscle weakness (generalized)  Other abnormalities of gait and mobility  Localized edema  Repeated falls     Problem List Patient Active Problem List   Diagnosis Date Noted  . Lisfranc dislocation, left, sequela   . Ankle syndesmosis disruption, left, sequela   . Generalized anxiety disorder 11/03/2017  . Neuropathy 11/04/2016  . Paresthesias 11/04/2016      Laureen Abrahams, PT, DPT 06/20/20 3:59 PM    [facility] [address] [place], [place], [address] Phone: [phone]   Fax:  [phone]  Name: Ann Sampson MRN: [id number] Date of Birth: 01-16-1976

## 2020-06-20 NOTE — Patient Instructions (Signed)
Access Code: [email or web address] URL: [email or web address] Date: 06/20/2020 Prepared by: Moshe Cipro  Exercises Seated Ankle Alphabet - 3 x daily - 7 x weekly - 1 sets - 1 a-z Seated Ankle Circles - 3 x daily - 7 x weekly - 1 sets - 15 reps Seated Ankle Pumps on Table - 3 x daily - 7 x weekly - 1 sets - 15 reps Long Sitting Calf Stretch with Strap - 3 x daily - 7 x weekly - 1 sets - 3 reps - 30 sec hold Seated Toe Towel Scrunches - 3 x daily - 7 x weekly - 5 reps Seated Marble Pick-Up with Toes - 2-3 x daily - 7 x weekly - 10 reps Ankle Inversion Eversion Towel Slide - 2-3 x daily - 7 x weekly - 2 sets - 10 reps Standing Single Leg Stance with Unilateral Counter Support - 1 x daily - 7 x weekly - 1 sets - 3 reps - 10 sec hold Standing Tandem Balance with Counter Support - 1 x daily - 7 x weekly - 1 sets - 3 reps - 20 sec hold Tandem Walking with Counter Support - 1 x daily - 7 x weekly - 1 sets - 4 reps Standing Gastroc Stretch - 1 x daily - 7 x weekly - 1 sets - 3 reps - 30 sec hold Standing Soleus Stretch - 1 x daily - 7 x weekly - 1 sets - 3 reps - 30 sec hold Heel rises with counter support - 1 x daily - 7 x weekly - 20 reps - 1 sets

## 2020-06-25 ENCOUNTER — Encounter: Payer: Self-pay | Admitting: Physical Therapy

## 2020-06-25 ENCOUNTER — Ambulatory Visit: Payer: 59 | Admitting: Physical Therapy

## 2020-06-25 ENCOUNTER — Other Ambulatory Visit: Payer: Self-pay

## 2020-06-25 DIAGNOSIS — M6281 Muscle weakness (generalized): Secondary | ICD-10-CM | POA: Diagnosis not present

## 2020-06-25 DIAGNOSIS — R296 Repeated falls: Secondary | ICD-10-CM

## 2020-06-25 DIAGNOSIS — M25672 Stiffness of left ankle, not elsewhere classified: Secondary | ICD-10-CM

## 2020-06-25 DIAGNOSIS — R2689 Other abnormalities of gait and mobility: Secondary | ICD-10-CM

## 2020-06-25 DIAGNOSIS — M25572 Pain in left ankle and joints of left foot: Secondary | ICD-10-CM | POA: Diagnosis not present

## 2020-06-25 DIAGNOSIS — R600 Localized edema: Secondary | ICD-10-CM

## 2020-06-25 NOTE — Therapy (Signed)
[facility] [address] [place], [place], [address] Phone: [phone]   Fax:  [phone]  Physical Therapy Treatment  Patient Details  Name: Ann Sampson MRN: [id number] Date of Birth: 05-08-1976 Referring Provider (PT): Persons, Bevely Palmer, [place]   Encounter Date: 06/25/2020   PT End of Session - 06/25/20 1554    Visit Number 11    Number of Visits 16    Date for PT Re-Evaluation 07/16/20    Authorization Type [employer] OTHER    PT Start Time 1515    PT Stop Time 1604    PT Time Calculation (min) 49 min    Activity Tolerance No increased pain    Behavior During Therapy [facility] for tasks assessed/performed           Past Medical History:  Diagnosis Date   Anxiety    Arthritis    Asthma    Complication of anesthesia    with endoscopic spoke profeaty   Depression    Family history of adverse reaction to anesthesia    sister- vommitting   Headache    magraines in the past   Neuropathy    feet mainly- hands some   Seasonal allergies     Past Surgical History:  Procedure Laterality Date   BREAST SURGERY Bilateral    Breast Enhancement   ESOPHAGOGASTRODUODENOSCOPY     x 3  last one approx 2002   KNEE SURGERY Right    arthroscopy- cartilage   ORIF ANKLE FRACTURE Left 04/18/2020   Procedure: OPEN REDUCTION INTERNAL FIXATION (ORIF) SYNDESMOSIS LEFT ANKLE AND LISFRANC JOINT LEFT FOOT;  Surgeon: Newt Minion, MD;  Location: [place];  Service: Orthopedics;  Laterality: Left;    There were no vitals filed for this visit.   Subjective Assessment - 06/25/20 1517    Subjective did a couple spin classes before PT, ankle is a little sore; occasional quick sharp pains with certain movements    Limitations Standing;Walking;House hold activities    Patient Stated Goals improve mobility, return to baseline    Currently in Pain? Yes    Pain Location Ankle    Pain Orientation Left    Pain Descriptors / Indicators  Sharp    Pain Type Acute pain;Chronic pain    Pain Onset More than a month ago    Pain Frequency Intermittent    Aggravating Factors  walking    Pain Relieving Factors elevation                             [facility] Adult PT Treatment/Exercise - 06/25/20 1520      Vasopneumatic   Number Minutes Vasopneumatic  10 minutes    Vasopnuematic Location  Ankle    Vasopneumatic Pressure Medium    Vasopneumatic Temperature  34      Ankle Exercises: Aerobic   Recumbent Bike L4 x 8 min      Ankle Exercises: Stretches   Soleus Stretch 3 reps;30 seconds   slantboard   Gastroc Stretch 3 reps;30 seconds   slantboard     Ankle Exercises: Standing   Side Shuffle (Round Trip) treadmill length x 2 laps    Other Standing Ankle Exercises step up onto foam 2x10; 1UE support; leading with LLE      Ankle Exercises: Seated   Other Seated Ankle Exercises ankle t-band: PF with L5 band; eversion with L4 band; Lt 3x10  PT Short Term Goals - 06/20/20 1556      PT SHORT TERM GOAL #1   Title Pt wil be Ind in an initial HEP.    Status Achieved    Target Date 06/18/20      PT SHORT TERM GOAL #2   Title improve Lt ankle ROM by 8 degrees all motions for improved function    Status Achieved    Target Date 06/18/20      PT SHORT TERM GOAL #3   Title amb with single crutch and boot (if still indicated) without increase in pain or significant gait deviations    Status Achieved    Target Date 06/18/20      PT SHORT TERM GOAL #4   Title negotiate stairs Mod I in order to return to sleeping in bedroom    Baseline 12/22: doing at home, still feels unsafe, supervision in clinic    Status Partially Met    Target Date 06/18/20             PT Long Term Goals - 05/28/20 1444      PT LONG TERM GOAL #1   Title independent with final HEP    Baseline 105, 93, 25, 45 respectively    Period Weeks    Status On-going      PT LONG TERM GOAL #2   Title demonstrate  Lt ankle ROM to WNL for improved function    Baseline 5/10    Time 4    Period Weeks    Status New      PT LONG TERM GOAL #3   Title demonstrate 3/5 Lt ankle strength for improved function    Baseline Much difficulty    Period Weeks    Status On-going      PT LONG TERM GOAL #4   Title amb without AD independently for improved function    Baseline 33 % limitation    Status On-going      PT LONG TERM GOAL #5   Title FOTO score improved to 58    Status On-going                 Plan - 06/25/20 1556    Clinical Impression Statement Increased pain with eversion/plantarflexion activities today with nausea. Symptoms settled with rest and vaso at end of session.  Will continue to benefit from PT to maximize function.    Personal Factors and Comorbidities Comorbidity 1    Comorbidities arthritis    Examination-Activity Limitations Bathing;Squat;Stairs;Stand;Transfers;Locomotion Level;Lift    Stability/Clinical Decision Making Evolving/Moderate complexity    Rehab Potential Good    PT Frequency 2x / week    PT Duration 8 weeks    PT Treatment/Interventions ADLs/Self Care Home Management;Cryotherapy;Electrical Stimulation;Iontophoresis 83m/ml Dexamethasone;Moist Heat;Ultrasound;Therapeutic exercise;Therapeutic activities;Patient/family education;Manual techniques;Dry needling;Taping;Vasopneumatic Device;Balance training;Functional mobility training;Stair training;Gait training;DME Instruction;Neuromuscular re-education;Passive range of motion;Scar mobilization    PT Next Visit Plan needs stair training (currently sleeping downstairs), Lt ankle AROM/strength/PROM, gait training with single crutch, pre gait activities; check FOTO    PT Home Exercise Plan Access Code: [email or web address]   Consulted and Agree with Plan of Care Patient           Patient will benefit from skilled therapeutic intervention in order to improve the following deficits and impairments:  Decreased range of  motion,Abnormal gait,Decreased endurance,Increased edema,Decreased scar mobility,Decreased knowledge of precautions,Decreased knowledge of use of DME,Decreased activity tolerance,Decreased mobility,Decreased balance,Difficulty walking,Decreased strength  Visit Diagnosis: Pain in left ankle and joints of left  foot  Stiffness of left ankle, not elsewhere classified  Muscle weakness (generalized)  Other abnormalities of gait and mobility  Localized edema  Repeated falls     Problem List Patient Active Problem List   Diagnosis Date Noted   Lisfranc dislocation, left, sequela    Ankle syndesmosis disruption, left, sequela    Generalized anxiety disorder 11/03/2017   Neuropathy 11/04/2016   Paresthesias 11/04/2016      Laureen Abrahams, PT, DPT 06/25/20 3:59 PM    [facility] [address] [place], [place], [address] Phone: [phone]   Fax:  [phone]  Name: NORISSA BARTEE MRN: [id number] Date of Birth: 02-28-1976

## 2020-06-26 ENCOUNTER — Telehealth: Payer: Self-pay | Admitting: Orthopedic Surgery

## 2020-06-26 NOTE — Telephone Encounter (Signed)
[employer] forms received. Sent to [employer]. °

## 2020-06-27 ENCOUNTER — Encounter: Payer: Self-pay | Admitting: Rehabilitative and Restorative Service Providers"

## 2020-06-27 ENCOUNTER — Other Ambulatory Visit: Payer: Self-pay

## 2020-06-27 ENCOUNTER — Ambulatory Visit: Payer: 59 | Admitting: Rehabilitative and Restorative Service Providers"

## 2020-06-27 DIAGNOSIS — M25672 Stiffness of left ankle, not elsewhere classified: Secondary | ICD-10-CM

## 2020-06-27 DIAGNOSIS — M25572 Pain in left ankle and joints of left foot: Secondary | ICD-10-CM | POA: Diagnosis not present

## 2020-06-27 DIAGNOSIS — R262 Difficulty in walking, not elsewhere classified: Secondary | ICD-10-CM | POA: Diagnosis not present

## 2020-06-27 DIAGNOSIS — R600 Localized edema: Secondary | ICD-10-CM

## 2020-06-27 DIAGNOSIS — M6281 Muscle weakness (generalized): Secondary | ICD-10-CM | POA: Diagnosis not present

## 2020-06-27 NOTE — Therapy (Signed)
[facility] [address] [place], [place], [address] Phone: [phone]   Fax:  [phone]  Physical Therapy Treatment  Patient Details  Name: Ann Sampson MRN: [id number] Date of Birth: [birth year] Referring Provider (PT): Persons, Bevely Palmer, [place]   Encounter Date: 06/27/2020   PT End of Session - 06/27/20 1309    Visit Number 12    Number of Visits 16    Date for PT Re-Evaluation 07/16/20    Authorization Type [employer] OTHER    PT Start Time 0840    PT Stop Time 0928    PT Time Calculation (min) 48 min    Activity Tolerance No increased pain;Patient tolerated treatment well    Behavior During Therapy [facility] for tasks assessed/performed           Past Medical History:  Diagnosis Date  . Anxiety   . Arthritis   . Asthma   . Complication of anesthesia    with endoscopic spoke profeaty  . Depression   . Family history of adverse reaction to anesthesia    sister- vommitting  . Headache    magraines in the past  . Neuropathy    feet mainly- hands some  . Seasonal allergies     Past Surgical History:  Procedure Laterality Date  . BREAST SURGERY Bilateral    Breast Enhancement  . ESOPHAGOGASTRODUODENOSCOPY     x 3  last one approx 2002  . KNEE SURGERY Right    arthroscopy- cartilage  . ORIF ANKLE FRACTURE Left 04/18/2020   Procedure: OPEN REDUCTION INTERNAL FIXATION (ORIF) SYNDESMOSIS LEFT ANKLE AND LISFRANC JOINT LEFT FOOT;  Surgeon: Newt Minion, MD;  Location: [place];  Service: Orthopedics;  Laterality: Left;    There were no vitals filed for this visit.   Subjective Assessment - 06/27/20 0916    Subjective Ann Sampson reports increased swelling with WB over the weekend.    Limitations Standing;Walking;House hold activities    Patient Stated Goals improve mobility, return to baseline    Currently in Pain? Yes    Pain Score 2     Pain Location Ankle    Pain Orientation Left    Pain Descriptors / Indicators Sharp     Pain Type Chronic pain    Pain Onset More than a month ago    Pain Frequency Intermittent    Aggravating Factors  WB    Pain Relieving Factors Elevation and ice, rest    Effect of Pain on Daily Activities Limits WB function    Multiple Pain Sites No                             [facility] Adult PT Treatment/Exercise - 06/27/20 0001      Vasopneumatic   Number Minutes Vasopneumatic  --    Vasopnuematic Location  --    Vasopneumatic Pressure --    Vasopneumatic Temperature  --      Ankle Exercises: Stretches   Soleus Stretch 3 reps;60 seconds   slant board 60 seconds 20 seconds standing (toe in for both)   Gastroc Stretch 3 reps;60 seconds;Other (comment)   Slant board 60 seconds standing 20 seconds (slight toe in with both)     Ankle Exercises: Supine   T-Band Inversion/Eversion with son's help 10X each slow eccentrics green theraband      Ankle Exercises: Standing   Toe Raise 20 reps;3 seconds   Heel to toe raises with slow  eccentrics     Ankle Exercises: Aerobic   Recumbent Bike Seat 7 Level 4 for 8 minutes                  PT Education - 06/27/20 1308    Education Details Reviewed HEP.  Added heel cords stretches and calf strengthening.    Person(s) Educated Patient;Child(ren)    Methods Explanation;Demonstration;Tactile cues;Verbal cues;Handout    Comprehension Verbal cues required;Need further instruction;Returned demonstration;Verbalized understanding;Tactile cues required            PT Short Term Goals - 06/27/20 0919      PT SHORT TERM GOAL #1   Title Pt wil be Ind in an initial HEP.    Status Achieved    Target Date 06/18/20      PT SHORT TERM GOAL #2   Title improve Lt ankle ROM by 8 degrees all motions for improved function    Status Achieved    Target Date 06/18/20      PT SHORT TERM GOAL #3   Title amb with single crutch and boot (if still indicated) without increase in pain or significant gait deviations    Status Achieved     Target Date 06/18/20      PT SHORT TERM GOAL #4   Title negotiate stairs Mod I in order to return to sleeping in bedroom    Baseline 12/22: doing at home, still feels unsafe, supervision in clinic    Status Partially Met    Target Date 06/18/20             PT Long Term Goals - 06/27/20 1309      PT LONG TERM GOAL #1   Title independent with final HEP    Baseline 105, 93, 25, 45 respectively    Period Weeks    Status On-going      PT LONG TERM GOAL #2   Title demonstrate Lt ankle ROM to WNL for improved function    Baseline 5/10    Time 4    Period Weeks    Status On-going      PT LONG TERM GOAL #3   Title demonstrate 3/5 Lt ankle strength for improved function    Baseline Much difficulty    Period Weeks    Status On-going      PT LONG TERM GOAL #4   Title amb without AD independently for improved function    Baseline 33 % limitation    Status On-going      PT LONG TERM GOAL #5   Title FOTO score improved to 58    Status On-going                 Plan - 06/27/20 0920    Clinical Impression Statement [name] did well with today's progressions.  DF AROM and ankle strength remain high priorities with her physical therapy.  Balance and proprioception will also be an emphasis as hel cords length and strength progress to improve gait and [name]'s weight-bearing function.    Personal Factors and Comorbidities Comorbidity 1    Comorbidities arthritis    Examination-Activity Limitations Bathing;Squat;Stairs;Stand;Transfers;Locomotion Level;Lift    Stability/Clinical Decision Making Evolving/Moderate complexity    Rehab Potential Good    PT Frequency 2x / week    PT Duration 8 weeks    PT Treatment/Interventions ADLs/Self Care Home Management;Cryotherapy;Electrical Stimulation;Iontophoresis 72m/ml Dexamethasone;Moist Heat;Ultrasound;Therapeutic exercise;Therapeutic activities;Patient/family education;Manual techniques;Dry needling;Taping;Vasopneumatic Device;Balance  training;Functional mobility training;Stair training;Gait training;DME Instruction;Neuromuscular re-education;Passive range of motion;Scar mobilization  PT Next Visit Plan Needs stair training (currently sleeping downstairs), Lt ankle AROM/strength/PROM, gait training with single crutch, pre gait activities; check FOTO    PT Home Exercise Plan Access Code: [email or web address]    Consulted and Agree with Plan of Care Patient           Patient will benefit from skilled therapeutic intervention in order to improve the following deficits and impairments:  Decreased range of motion,Abnormal gait,Decreased endurance,Increased edema,Decreased scar mobility,Decreased knowledge of precautions,Decreased knowledge of use of DME,Decreased activity tolerance,Decreased mobility,Decreased balance,Difficulty walking,Decreased strength  Visit Diagnosis: Difficulty in walking, not elsewhere classified  Muscle weakness (generalized)  Stiffness of left ankle, not elsewhere classified  Pain in left ankle and joints of left foot  Localized edema     Problem List Patient Active Problem List   Diagnosis Date Noted  . Lisfranc dislocation, left, sequela   . Ankle syndesmosis disruption, left, sequela   . Generalized anxiety disorder 11/03/2017  . Neuropathy 11/04/2016  . Paresthesias 11/04/2016    Farley Ly PT, MPT 06/27/2020, 1:13 PM  [facility] [address] [place], [place], [address] Phone: [phone]   Fax:  [phone]  Name: Ann Sampson MRN: [id number] Date of Birth: [birth year]

## 2020-06-30 ENCOUNTER — Other Ambulatory Visit: Payer: Self-pay

## 2020-06-30 ENCOUNTER — Ambulatory Visit (HOSPITAL_COMMUNITY)
Admission: EM | Admit: 2020-06-30 | Discharge: 2020-06-30 | Disposition: A | Discharge status: Home / Self Care | Payer: 59 | Attending: Family Medicine | Admitting: Family Medicine

## 2020-06-30 ENCOUNTER — Encounter (HOSPITAL_COMMUNITY): Payer: Self-pay | Admitting: Emergency Medicine

## 2020-06-30 DIAGNOSIS — W57XXXA Bitten or stung by nonvenomous insect and other nonvenomous arthropods, initial encounter: Secondary | ICD-10-CM

## 2020-06-30 DIAGNOSIS — S80861A Insect bite (nonvenomous), right lower leg, initial encounter: Secondary | ICD-10-CM

## 2020-06-30 MED ORDER — TRIAMCINOLONE ACETONIDE 0.1 % EX CREA: 1.0000 "application " | TOPICAL_CREAM | Freq: Two times a day (BID) | CUTANEOUS | 0 refills | Status: DC

## 2020-06-30 NOTE — ED Provider Notes (Signed)
[facility] CARE CENTER   [id number] 06/30/20 Arrival Time: 1012  ASSESSMENT & PLAN:  1. Insect bite of right lower leg, initial encounter    No signs of infection. May continue abx ointment if desired. No ulcerations. For itching: Meds ordered this encounter  Medications  . triamcinolone (KENALOG) 0.1 %    Sig: Apply 1 application topically 2 (two) times daily.    Dispense:  15 g    Refill:  0     Will follow up with PCP or here if worsening or failing to improve as anticipated. Reviewed expectations re: course of current medical issues. Questions answered. Outlined signs and symptoms indicating need for more acute intervention. Patient verbalized understanding. After Visit Summary given.   SUBJECTIVE:  Ann Sampson is a 45 y.o. female who presents with a skin complaint. Questions spider/insect bite. LLE; noted 2-3 d ago on ankle; now sees two areas just below medial knee; itching. Initial area on ankle blistered; now popped; no bleeding or drainage. Afebrile. Worried re: brown recluse per [employer].   OBJECTIVE: Vitals:   06/30/20 1059  BP: (!) 155/93  Pulse: 85  Resp: 16  Temp: 98.9 F (37.2 C)  TempSrc: Oral  SpO2: 100%    General appearance: alert; no distress HEENT: [place]; AT Neck: supple with FROM Extremities: no edema; moves all extremities normally Skin: warm and dry; signs of infection: no; L ankle with approx 1x2 cam area of redness with opened blister; mild erythema; cool to touch. Below medial knee are two 1 cm round areas of erythema; slight warmth. Psychological: alert and cooperative; normal mood and affect  Allergies  Allergen Reactions  . Bee Venom Anaphylaxis  . Hornet Venom Anaphylaxis  . Latex Hives and Shortness Of Breath  . Prednisone Anaphylaxis and Rash  . Chap-Aid [Chapstick] Swelling    Swelling of lips  . Bextra [Valdecoxib] Rash  . Flagyl [Metronidazole] Rash  . Vioxx [Rofecoxib] Rash    Past Medical History:  Diagnosis  Date  . Anxiety   . Arthritis   . Asthma   . Complication of anesthesia    with endoscopic spoke profeaty  . Depression   . Family history of adverse reaction to anesthesia    sister- vommitting  . Headache    magraines in the past  . Neuropathy    feet mainly- hands some  . Seasonal allergies    Social History   Socioeconomic History  . Marital status: Unknown    Spouse name: Not on file  . Number of children: Not on file  . Years of education: Not on file  . Highest education level: Not on file  Occupational History  . Not on file  Tobacco Use  . Smoking status: Former Games developer  . Smokeless tobacco: Never Used  Substance and Sexual Activity  . Alcohol use: Yes    Alcohol/week: 1.0 standard drink    Types: 1 Glasses of wine per week    Comment: occcasionally  . Drug use: No  . Sexual activity: Not on file  Other Topics Concern  . Not on file  Social History Narrative  . Not on file   Social Determinants of Health   Financial Resource Strain: Not on file  Food Insecurity: Not on file  Transportation Needs: Not on file  Physical Activity: Not on file  Stress: Not on file  Social Connections: Not on file  Intimate Partner Violence: Not on file   Family History  Problem Relation Age of Onset  .  Skin cancer Mother   . Asthma Son   . Seizures Maternal Grandmother   . Cancer Maternal Grandmother   . Stroke Maternal Grandmother   . Neuropathy Maternal Grandfather   . Diabetic kidney disease Maternal Grandfather   . Diabetes Maternal Grandfather   . Heart disease Paternal Grandfather   . Hyperlipidemia Paternal Grandfather   . Hypertension Paternal Grandfather    Past Surgical History:  Procedure Laterality Date  . BREAST SURGERY Bilateral    Breast Enhancement  . ESOPHAGOGASTRODUODENOSCOPY     x 3  last one approx 2002  . KNEE SURGERY Right    arthroscopy- cartilage  . ORIF ANKLE FRACTURE Left 04/18/2020   Procedure: OPEN REDUCTION INTERNAL FIXATION  (ORIF) SYNDESMOSIS LEFT ANKLE AND LISFRANC JOINT LEFT FOOT;  Surgeon: Nadara Mustard, MD;  Location: [facility] OR;  Service: Orthopedics;  Laterality: Left;     Mardella Layman, MD 06/30/20 [phone]

## 2020-06-30 NOTE — ED Triage Notes (Signed)
PT C/O: possible spider bite she felt 3 days ago associated w/blister to the RLE that has drained, swelling, and redness that is now moving up her RLE  Sx also include nauseas  DENIES: f/v/d  TAKING MEDS: OTC neosporin   A&O x4... NAD... Ambulatory

## 2020-07-02 ENCOUNTER — Telehealth: Payer: Self-pay | Admitting: Family Medicine

## 2020-07-02 ENCOUNTER — Encounter: Payer: 59 | Admitting: Physical Therapy

## 2020-07-02 NOTE — Telephone Encounter (Signed)
Returned patients call regarding message left with after hours over weekend . LVM for pt to call back to schedule an appt if needed for her complaint of possible spider bite

## 2020-07-04 ENCOUNTER — Other Ambulatory Visit: Payer: Self-pay

## 2020-07-04 ENCOUNTER — Encounter: Payer: Self-pay | Admitting: Physical Therapy

## 2020-07-04 ENCOUNTER — Ambulatory Visit: Payer: 59 | Admitting: Physical Therapy

## 2020-07-04 ENCOUNTER — Encounter: Payer: Self-pay | Admitting: Family Medicine

## 2020-07-04 ENCOUNTER — Ambulatory Visit: Payer: 59 | Admitting: Family Medicine

## 2020-07-04 VITALS — BP 129/88 | HR 93 | Temp 98.5°F | Ht 65.0 in | Wt 193.0 lb

## 2020-07-04 DIAGNOSIS — R600 Localized edema: Secondary | ICD-10-CM

## 2020-07-04 DIAGNOSIS — L139 Bullous disorder, unspecified: Secondary | ICD-10-CM

## 2020-07-04 DIAGNOSIS — R2689 Other abnormalities of gait and mobility: Secondary | ICD-10-CM

## 2020-07-04 DIAGNOSIS — R262 Difficulty in walking, not elsewhere classified: Secondary | ICD-10-CM

## 2020-07-04 DIAGNOSIS — M25672 Stiffness of left ankle, not elsewhere classified: Secondary | ICD-10-CM | POA: Diagnosis not present

## 2020-07-04 DIAGNOSIS — R296 Repeated falls: Secondary | ICD-10-CM

## 2020-07-04 DIAGNOSIS — M6281 Muscle weakness (generalized): Secondary | ICD-10-CM | POA: Diagnosis not present

## 2020-07-04 DIAGNOSIS — M25572 Pain in left ankle and joints of left foot: Secondary | ICD-10-CM | POA: Diagnosis not present

## 2020-07-04 MED ORDER — CLOBETASOL PROPIONATE 0.05 % EX CREA: 1.0000 "application " | TOPICAL_CREAM | Freq: Two times a day (BID) | CUTANEOUS | 0 refills | Status: DC

## 2020-07-04 MED ORDER — DOXYCYCLINE HYCLATE 100 MG PO TABS: 100.0000 mg | ORAL_TABLET | Freq: Two times a day (BID) | ORAL | 0 refills | Status: DC

## 2020-07-04 NOTE — Progress Notes (Signed)
Subjective:  Patient ID: Ann Sampson, female    DOB: 1975/09/15  Age: 45 y.o. MRN: [id number]  CC:  Chief Complaint  Patient presents with  . possible bug bite    Pt reports she thinks she was bit by something a week ago on her R ankel. Pt reports since then pt has noticed small bumps on her R leg, left shoulder, and L foot. Pt went to an urgent care on 06/30/2020. On that day the bumps were small. Pt reports than since then the bumps have blistered and are draining. Pt reports some itching at times and when the bumps first show up their is some pain.    HPI Ann Sampson presents for   History  Right ankle rash  Seen at [facility] urgent care on January 1.  Possible spider or insect bite 2 to 3 days prior, felt bite when was in basement in laundry room, but did not see the bug. Initially front of ankle, then noted to areas just below her medial knee with itching, and inside of foot. blisters.  Initial area on ankle blister then popped.  No sign of infection at that time, triamcinolone 0.1% twice daily as needed with continued over-the-counter antibiotic ointment as needed. Blister on inside of calf noted after urgent care visit 4 days ago.  Blister on left foot 3 days ago.  New rash on left upper arm 2 days ago- itching with small blisters.  No travel. No recent yard work.  No new dermatologic products, soap, detergent. Dog at home. Inside usually except for walks.  No fevers, feels ok now. Fatigued over the weekend, better after 2 days.  No oral/genital lesions.  R leg feels better. Sleeps by self. Nobody at home with same rash. Hands spared.   Tx: Triamcinolone - few days - min relief for itch  Benadryl at night helps. Sedating.  Less itchy.  Antibacterial soap. Off neosporin.      Patient Active Problem List   Diagnosis Date Noted  . Lisfranc dislocation, left, sequela   . Ankle syndesmosis disruption, left, sequela   . Generalized anxiety disorder  11/03/2017  . Neuropathy 11/04/2016  . Paresthesias 11/04/2016   Past Medical History:  Diagnosis Date  . Anxiety   . Arthritis   . Asthma   . Complication of anesthesia    with endoscopic spoke profeaty  . Depression   . Family history of adverse reaction to anesthesia    sister- vommitting  . Headache    magraines in the past  . Neuropathy    feet mainly- hands some  . Seasonal allergies    Past Surgical History:  Procedure Laterality Date  . BREAST SURGERY Bilateral    Breast Enhancement  . ESOPHAGOGASTRODUODENOSCOPY     x 3  last one approx 2002  . KNEE SURGERY Right    arthroscopy- cartilage  . ORIF ANKLE FRACTURE Left 04/18/2020   Procedure: OPEN REDUCTION INTERNAL FIXATION (ORIF) SYNDESMOSIS LEFT ANKLE AND LISFRANC JOINT LEFT FOOT;  Surgeon: Newt Minion, MD;  Location: [facility];  Service: Orthopedics;  Laterality: Left;   Allergies  Allergen Reactions  . Bee Venom Anaphylaxis  . Hornet Venom Anaphylaxis  . Latex Hives and Shortness Of Breath  . Prednisone Anaphylaxis and Rash  . Chap-Aid [Chapstick] Swelling    Swelling of lips  . Bextra [Valdecoxib] Rash  . Flagyl [Metronidazole] Rash  . Vioxx [Rofecoxib] Rash   Prior to Admission medications   Medication Sig  Start Date End Date Taking? Authorizing Provider  aspirin EC 81 MG tablet Take 1 tablet (81 mg total) by mouth daily. Swallow whole. 04/18/20 04/18/21 Yes Persons, [place], [place]  COLLAGEN PO Take 15 g by mouth daily.   Yes [provider]  Emollient (ROC RETINOL CORREXION) CREA Apply 1 application topically every other day.   Yes [provider]  EPINEPHrine 0.3 mg/0.3 mL IJ SOAJ injection USE AS DIRECTED Patient taking differently: Inject 0.3 mg into the muscle as needed for anaphylaxis. 08/10/19  Yes Shade Flood, MD  escitalopram (LEXAPRO) 20 MG tablet Take 1 tablet (20 mg total) by mouth daily. 02/15/20  Yes Shade Flood, MD  gabapentin (NEURONTIN) 300 MG capsule Take 1  capsule (300 mg total) by mouth at bedtime. 12/01/19  Yes McCue, Shanda Bumps, NP  Multiple Vitamins-Minerals (ADULT GUMMY PO) Take 2 capsules by mouth daily.    Yes [provider]  triamcinolone (KENALOG) 0.1 % Apply 1 application topically 2 (two) times daily. 06/30/20  Yes Mardella Layman, MD   Social History   Socioeconomic History  . Marital status: Unknown    Spouse name: Not on file  . Number of children: Not on file  . Years of education: Not on file  . Highest education level: Not on file  Occupational History  . Not on file  Tobacco Use  . Smoking status: Former Games developer  . Smokeless tobacco: Never Used  Substance and Sexual Activity  . Alcohol use: Yes    Alcohol/week: 1.0 standard drink    Types: 1 Glasses of wine per week    Comment: occcasionally  . Drug use: No  . Sexual activity: Not on file  Other Topics Concern  . Not on file  Social History Narrative  . Not on file   Social Determinants of Health   Financial Resource Strain: Not on file  Food Insecurity: Not on file  Transportation Needs: Not on file  Physical Activity: Not on file  Stress: Not on file  Social Connections: Not on file  Intimate Partner Violence: Not on file    Review of Systems   Objective:   Vitals:   07/04/20 1026  BP: 129/88  Pulse: 93  Temp: 98.5 F (36.9 C)  TempSrc: Temporal  SpO2: 95%  Weight: 193 lb (87.5 kg)  Height: 5\' 5"  (1.651 m)     Physical Exam Constitutional:      General: She is not in acute distress.    Appearance: Normal appearance. She is well-developed and well-nourished. She is not ill-appearing or toxic-appearing.  HENT:     Head: Normocephalic and atraumatic.  Cardiovascular:     Rate and Rhythm: Normal rate.  Pulmonary:     Effort: Pulmonary effort is normal.  Skin:    Findings: Rash present. Rash is vesicular (see phptos - R ankle/foot, calf, left upper arm and plantar left foot. ).  Neurological:     Mental Status: She is alert and  oriented to person, place, and time.  Psychiatric:        Mood and Affect: Mood and affect normal.         40 minutes spent during visit, greater than 50% counseling and assimilation of information, chart review, and discussion of plan.     Assessment & Plan:  Ann Sampson is a 45 y.o. female . Bullous dermatitis - Plan: clobetasol cream (TEMOVATE) 0.05 %, doxycycline (VIBRA-TABS) 100 MG tablet, Ambulatory referral to Dermatology Initially thought to be due  to insect bite but no actual organisms seen.  Spread to other areas with new bullous lesions.  Pruritic patch on left arm new symptoms past few days.  Differential includes bullous pemphigoid.  No oral lesions, no genital lesions. No current systemic symptoms.  Few areas of involvement, initial treatment with doxycycline, clobetasol 0.05% and dermatology referral for possible biopsy.  Unable to take oral steroids, has tolerated topical treatments.  ER/RTC precautions given.  Meds ordered this encounter  Medications  . clobetasol cream (TEMOVATE) 0.05 %    Sig: Apply 1 application topically 2 (two) times daily. To affected lesions    Dispense:  30 g    Refill:  0  . doxycycline (VIBRA-TABS) 100 MG tablet    Sig: Take 1 tablet (100 mg total) by mouth 2 (two) times daily.    Dispense:  20 tablet    Refill:  0   Patient Instructions   Areas may be due to insect bite or reaction from initial bite, but as we discussed a condition called bullous pemphigoid is also possible.  I will refer you to dermatology.  For now change the topical steroid to a stronger steroid called clobetasol and start doxycycline antibiotic 1 pill twice per day.  If any lesions/new rash or ulcers in the mouth or genitals be seen right away.  Can try zyrtec or allegra during day if needed, benadryl at night ok.  Return to the clinic or go to the nearest emergency room if any of your symptoms worsen or new symptoms occur.  Blisters, Adult  A blister is a  raised bubble of skin filled with liquid. Blisters often develop in an area of the skin that repeatedly rubs or presses against another surface (friction blister). Friction blisters can occur on any part of the body, but they usually develop on the hands or feet. Long-term pressure on the same area of the skin can also lead to areas of hardened skin (calluses). What are the causes? A blister can be caused by:  An injury.  A burn.  An allergic reaction.  An infection.  Exposure to irritating chemicals.  Friction, especially in an area with a lot of heat and moisture. Friction blisters often result from:  Sports.  Repetitive activities.  Using tools and doing other activities without wearing gloves.  Shoes that are too tight or too loose. What are the signs or symptoms? A blister is often round and looks like a bump. It may:  Itch.  Be painful to the touch. Before a blister forms, the skin may:  Become red.  Feel warm.  Itch.  Be painful to the touch. How is this diagnosed? A blister is diagnosed with a physical exam. How is this treated? Treatment usually involves protecting the area where the blister has formed until the skin has healed. Other treatments may include:  A bandage (dressing) to cover the blister.  Extra padding around and over the blister, so that it does not rub on anything.  Antibiotic ointment. Most blisters break open, dry up, and go away on their own within 1-2 weeks. Blisters that are very painful may be drained before they break open on their own. If the blister is large or painful, it can be drained by: 1. Sterilizing a small needle with rubbing alcohol. 2. Washing your hands with soap and water. 3. Inserting the needle in the edge of the blister to make a small hole. Some fluid will drain out of the hole. Let the top  or roof of the blister stay in place. This helps the skin heal. 4. Washing the blister with mild soap and water. 5. Covering  the blister with antibiotic ointment, if prescribed by your health care provider, and a dressing. Some blisters may need to be drained by a health care provider. Follow these instructions at home:  Protect the area where the blister has formed as told by your health care provider.  Keep your blister clean and dry. This helps to prevent infection.  Do not pop your blister. This can cause infection.  If you were prescribed an antibiotic, use it as told by your health care provider. Do not stop using the antibiotic even if your condition improves.  Wear different shoes until the blister heals.  Avoid the activity that caused the blister until your blister heals.  Check your blister every day for signs of infection. Check for: ? More redness, swelling, or pain. ? More fluid or blood. ? Warmth. ? Pus or a bad smell. ? The blister getting better and then getting worse. How is this prevented? Taking these steps can help to prevent blisters that are caused by friction. Make sure you:  Wear comfortable shoes that fit well.  Always wear socks with shoes.  Wear extra socks or use tape, bandages, or pads over blister-prone areas as needed. You may also apply petroleum jelly under bandages in blister-prone areas.  Wear protective gear, such as gloves, when participating in sports or activities that can cause blisters.  Wear loose-fitting, moisture-wicking clothes when participating in sports or activities.  Use powders as needed to keep your feet dry. Contact a health care provider if:  You have more redness, swelling, or pain around your blister.  You have more fluid or blood coming from your blister.  Your blister feels warm to the touch.  You have pus or a bad smell coming from your blister.  You have a fever or chills.  Your blister gets better and then it gets worse. This information is not intended to replace advice given to you by your health care provider. Make sure you  discuss any questions you have with your health care provider. Document Revised: 05/29/2017 Document Reviewed: 12/28/2015 Elsevier Patient Education  [employer].    If you have lab work done today you will be contacted with your lab results within the next 2 weeks.  If you have not heard from [place] then please contact us. The fastest way to get your results is to register for My Chart.   IF you received an x-ray today, you will receive an invoice from [facility]. Please contact [facility] at [phone] with questions or concerns regarding your invoice.   IF you received labwork today, you will receive an invoice from [place]. Please contact [facility] at [phone] with questions or concerns regarding your invoice.   Our billing staff will not be able to assist you with questions regarding bills from these companies.  You will be contacted with the lab results as soon as they are available. The fastest way to get your results is to activate your My Chart account. Instructions are located on the last page of this paperwork. If you have not heard from [place] regarding the results in 2 weeks, please contact this office.         Signed, [name], MD [facility]

## 2020-07-04 NOTE — Therapy (Signed)
[facility] [address] [place], [place], [address] Phone: [phone]   Fax:  [phone]  Physical Therapy Treatment  Patient Details  Name: Ann Sampson MRN: [id number] Date of Birth: [birth year] Referring Provider (PT): Persons, [place], [place]   Encounter Date: 07/04/2020   PT End of Session - 07/04/20 1425    Visit Number 13    Number of Visits 16    Date for PT Re-Evaluation 07/16/20    Authorization Type [employer] OTHER    PT Start Time 1345    PT Stop Time 1425    PT Time Calculation (min) 40 min    Activity Tolerance No increased pain;Patient tolerated treatment well    Behavior During Therapy [facility] for tasks assessed/performed           Past Medical History:  Diagnosis Date  . Anxiety   . Arthritis   . Asthma   . Complication of anesthesia    with endoscopic spoke profeaty  . Depression   . Family history of adverse reaction to anesthesia    sister- vommitting  . Headache    magraines in the past  . Neuropathy    feet mainly- hands some  . Seasonal allergies     Past Surgical History:  Procedure Laterality Date  . BREAST SURGERY Bilateral    Breast Enhancement  . ESOPHAGOGASTRODUODENOSCOPY     x 3  last one approx 2002  . KNEE SURGERY Right    arthroscopy- cartilage  . ORIF ANKLE FRACTURE Left 04/18/2020   Procedure: OPEN REDUCTION INTERNAL FIXATION (ORIF) SYNDESMOSIS LEFT ANKLE AND LISFRANC JOINT LEFT FOOT;  Surgeon: Newt Minion, MD;  Location: [facility];  Service: Orthopedics;  Laterality: Left;    There were no vitals filed for this visit.   Subjective Assessment - 07/04/20 1346    Subjective blisters have developed on multiple extremities    Limitations Standing;Walking;House hold activities    Patient Stated Goals improve mobility, return to baseline    Currently in Pain? Yes    Pain Score 0-No pain    Pain Onset --              [facility] PT Assessment - 07/04/20 0001      AROM   Left  Ankle Dorsiflexion 5    Left Ankle Plantar Flexion 40    Left Ankle Inversion 25    Left Ankle Eversion 12                         [facility] Adult PT Treatment/Exercise - 07/04/20 1357      Exercises   Exercises Knee/Hip      Knee/Hip Exercises: Machines for Strengthening   Cybex Knee Extension 15# 3x10    Cybex Knee Flexion 25# 3x10      Ankle Exercises: Machines for Strengthening   Cybex Leg Press LLE only 3x10; 50#; calf raises with 50# 3x10      Ankle Exercises: Aerobic   Recumbent Bike Seat 7 Level 4 for 8 minutes      Ankle Exercises: Stretches   Soleus Stretch 3 reps;30 seconds   slantboard   Gastroc Stretch 3 reps;30 seconds   slantboard                   PT Short Term Goals - 06/27/20 0919      PT SHORT TERM GOAL #1   Title Pt wil be Ind in an initial HEP.  Status Achieved    Target Date 06/18/20      PT SHORT TERM GOAL #2   Title improve Lt ankle ROM by 8 degrees all motions for improved function    Status Achieved    Target Date 06/18/20      PT SHORT TERM GOAL #3   Title amb with single crutch and boot (if still indicated) without increase in pain or significant gait deviations    Status Achieved    Target Date 06/18/20      PT SHORT TERM GOAL #4   Title negotiate stairs Mod I in order to return to sleeping in bedroom    Baseline 12/22: doing at home, still feels unsafe, supervision in clinic    Status Partially Met    Target Date 06/18/20             PT Long Term Goals - 06/27/20 1309      PT LONG TERM GOAL #1   Title independent with final HEP    Baseline 105, 93, 25, 45 respectively    Period Weeks    Status On-going      PT LONG TERM GOAL #2   Title demonstrate Lt ankle ROM to WNL for improved function    Baseline 5/10    Time 4    Period Weeks    Status On-going      PT LONG TERM GOAL #3   Title demonstrate 3/5 Lt ankle strength for improved function    Baseline Much difficulty    Period Weeks    Status  On-going      PT LONG TERM GOAL #4   Title amb without AD independently for improved function    Baseline 33 % limitation    Status On-going      PT LONG TERM GOAL #5   Title FOTO score improved to 58    Status On-going                 Plan - 07/04/20 1426    Clinical Impression Statement Pt tolerated session well today with continued work on ROM and strengthening.  Progressing well with PT at this time and will continue to benefit from PT to maximize function.    Personal Factors and Comorbidities Comorbidity 1    Comorbidities arthritis    Examination-Activity Limitations Bathing;Squat;Stairs;Stand;Transfers;Locomotion Level;Lift    Stability/Clinical Decision Making Evolving/Moderate complexity    Rehab Potential Good    PT Frequency 2x / week    PT Duration 8 weeks    PT Treatment/Interventions ADLs/Self Care Home Management;Cryotherapy;Electrical Stimulation;Iontophoresis 62m/ml Dexamethasone;Moist Heat;Ultrasound;Therapeutic exercise;Therapeutic activities;Patient/family education;Manual techniques;Dry needling;Taping;Vasopneumatic Device;Balance training;Functional mobility training;Stair training;Gait training;DME Instruction;Neuromuscular re-education;Passive range of motion;Scar mobilization    PT Next Visit Plan Needs stair training (currently sleeping downstairs), Lt ankle AROM/strength/PROM, gait training with single crutch, pre gait activities; check FOTO    PT Home Exercise Plan Access Code: [email or web address]   Consulted and Agree with Plan of Care Patient           Patient will benefit from skilled therapeutic intervention in order to improve the following deficits and impairments:  Decreased range of motion,Abnormal gait,Decreased endurance,Increased edema,Decreased scar mobility,Decreased knowledge of precautions,Decreased knowledge of use of DME,Decreased activity tolerance,Decreased mobility,Decreased balance,Difficulty walking,Decreased strength  Visit  Diagnosis: Difficulty in walking, not elsewhere classified  Muscle weakness (generalized)  Stiffness of left ankle, not elsewhere classified  Pain in left ankle and joints of left foot  Localized edema  Other abnormalities of gait and  mobility  Repeated falls     Problem List Patient Active Problem List   Diagnosis Date Noted  . Lisfranc dislocation, left, sequela   . Ankle syndesmosis disruption, left, sequela   . Generalized anxiety disorder 11/03/2017  . Neuropathy 11/04/2016  . Paresthesias 11/04/2016      Laureen Abrahams, PT, DPT 07/04/20 2:28 PM    [facility] [address] [place], [place], [address] Phone: [phone]   Fax:  [phone]  Name: ABEL RA MRN: [id number] Date of Birth: 07-07-1975

## 2020-07-04 NOTE — Patient Instructions (Addendum)
Areas may be due to insect bite or reaction from initial bite, but as we discussed a condition called bullous pemphigoid is also possible.  I will refer you to dermatology.  For now change the topical steroid to a stronger steroid called clobetasol and start doxycycline antibiotic 1 pill twice per day.  If any lesions/new rash or ulcers in the mouth or genitals be seen right away.  Can try zyrtec or allegra during day if needed, benadryl at night ok.  Return to the clinic or go to the nearest emergency room if any of your symptoms worsen or new symptoms occur.  Blisters, Adult  A blister is a raised bubble of skin filled with liquid. Blisters often develop in an area of the skin that repeatedly rubs or presses against another surface (friction blister). Friction blisters can occur on any part of the body, but they usually develop on the hands or feet. Long-term pressure on the same area of the skin can also lead to areas of hardened skin (calluses). What are the causes? A blister can be caused by:  An injury.  A burn.  An allergic reaction.  An infection.  Exposure to irritating chemicals.  Friction, especially in an area with a lot of heat and moisture. Friction blisters often result from:  Sports.  Repetitive activities.  Using tools and doing other activities without wearing gloves.  Shoes that are too tight or too loose. What are the signs or symptoms? A blister is often round and looks like a bump. It may:  Itch.  Be painful to the touch. Before a blister forms, the skin may:  Become red.  Feel warm.  Itch.  Be painful to the touch. How is this diagnosed? A blister is diagnosed with a physical exam. How is this treated? Treatment usually involves protecting the area where the blister has formed until the skin has healed. Other treatments may include:  A bandage (dressing) to cover the blister.  Extra padding around and over the blister, so that it does not rub  on anything.  Antibiotic ointment. Most blisters break open, dry up, and go away on their own within 1-2 weeks. Blisters that are very painful may be drained before they break open on their own. If the blister is large or painful, it can be drained by: 1. Sterilizing a small needle with rubbing alcohol. 2. Washing your hands with soap and water. 3. Inserting the needle in the edge of the blister to make a small hole. Some fluid will drain out of the hole. Let the top or roof of the blister stay in place. This helps the skin heal. 4. Washing the blister with mild soap and water. 5. Covering the blister with antibiotic ointment, if prescribed by your health care provider, and a dressing. Some blisters may need to be drained by a health care provider. Follow these instructions at home:  Protect the area where the blister has formed as told by your health care provider.  Keep your blister clean and dry. This helps to prevent infection.  Do not pop your blister. This can cause infection.  If you were prescribed an antibiotic, use it as told by your health care provider. Do not stop using the antibiotic even if your condition improves.  Wear different shoes until the blister heals.  Avoid the activity that caused the blister until your blister heals.  Check your blister every day for signs of infection. Check for: ? More redness, swelling, or pain. ?  More fluid or blood. ? Warmth. ? Pus or a bad smell. ? The blister getting better and then getting worse. How is this prevented? Taking these steps can help to prevent blisters that are caused by friction. Make sure you:  Wear comfortable shoes that fit well.  Always wear socks with shoes.  Wear extra socks or use tape, bandages, or pads over blister-prone areas as needed. You may also apply petroleum jelly under bandages in blister-prone areas.  Wear protective gear, such as gloves, when participating in sports or activities that can  cause blisters.  Wear loose-fitting, moisture-wicking clothes when participating in sports or activities.  Use powders as needed to keep your feet dry. Contact a health care provider if:  You have more redness, swelling, or pain around your blister.  You have more fluid or blood coming from your blister.  Your blister feels warm to the touch.  You have pus or a bad smell coming from your blister.  You have a fever or chills.  Your blister gets better and then it gets worse. This information is not intended to replace advice given to you by your health care provider. Make sure you discuss any questions you have with your health care provider. Document Revised: 05/29/2017 Document Reviewed: 12/28/2015 Elsevier Patient Education  [employer].    If you have lab work done today you will be contacted with your lab results within the next 2 weeks.  If you have not heard from [place] then please contact us. The fastest way to get your results is to register for My Chart.   IF you received an x-ray today, you will receive an invoice from [facility]. Please contact [facility] at [phone] with questions or concerns regarding your invoice.   IF you received labwork today, you will receive an invoice from [facility]. Please contact [facility] at [phone] with questions or concerns regarding your invoice.   Our billing staff will not be able to assist you with questions regarding bills from these companies.  You will be contacted with the lab results as soon as they are available. The fastest way to get your results is to activate your My Chart account. Instructions are located on the last page of this paperwork. If you have not heard from [place] regarding the results in 2 weeks, please contact this office.

## 2020-07-05 ENCOUNTER — Ambulatory Visit: Payer: Self-pay

## 2020-07-05 ENCOUNTER — Ambulatory Visit (INDEPENDENT_AMBULATORY_CARE_PROVIDER_SITE_OTHER): Payer: 59 | Admitting: Orthopedic Surgery

## 2020-07-05 ENCOUNTER — Encounter: Payer: Self-pay | Admitting: Orthopedic Surgery

## 2020-07-05 ENCOUNTER — Ambulatory Visit (INDEPENDENT_AMBULATORY_CARE_PROVIDER_SITE_OTHER): Payer: 59

## 2020-07-05 DIAGNOSIS — M25572 Pain in left ankle and joints of left foot: Secondary | ICD-10-CM

## 2020-07-05 DIAGNOSIS — S93325S Dislocation of tarsometatarsal joint of left foot, sequela: Secondary | ICD-10-CM

## 2020-07-05 NOTE — Progress Notes (Signed)
Office Visit Note   Patient: Ann Sampson           Date of Birth: [birth year]           MRN: [id number] Visit Date: 07/05/2020              Requested by: Shade Flood, MD [address] [place],  [place] [address] PCP: Shade Flood, MD  Chief Complaint  Patient presents with  . Left Foot - Routine Post Op    04/18/20 ORIF Lisfranc joint  . Left Ankle - Routine Post Op    ORIF left ankle fx       HPI: Patient is a 45 year old woman who is 10 weeks status post internal fixation for Lisfranc fracture dislocation as well as internal fixation for a syndesmotic injury.  She is currently wearing compression stockings and regular sneakers as well as an ASO.  Assessment & Plan: Visit Diagnoses:  1. Lisfranc dislocation, left, sequela   2. Pain in left ankle and joints of left foot     Plan: Patient was given instructions and demonstrated Achilles stretching she was given a note to return to work on Monday and that she should wear sneakers for 6 months.  Follow-Up Instructions: Return if symptoms worsen or fail to improve.   Ortho Exam  Patient is alert, oriented, no adenopathy, well-dressed, normal affect, normal respiratory effort. Examination patient does have some Achilles tightness with dorsiflexion about 10 degrees past neutral she was given instructions and demonstrated Achilles stretching the surgical incisions are well-healed she has no pain with range of motion of the foot or ankle.  Imaging: XR Ankle Complete Left  Result Date: 07/05/2020 Three-view radiographs of the left ankle shows a congruent mortise stable is intact syndesmotic hardware.  XR Foot Complete Left  Result Date: 07/05/2020 Three-view radiographs of the left foot shows stable internal fixation for the Lisfranc fracture dislocation.  No images are attached to the encounter.  Labs: Lab Results  Component Value Date   ESRSEDRATE 5 10/07/2017   ESRSEDRATE 2 02/26/2016   CRP 5.5 (H)  10/07/2017   LABURIC 4.0 10/07/2017   LABURIC 4.8 08/18/2013     Lab Results  Component Value Date   ALBUMIN 4.4 10/07/2017   ALBUMIN 4.6 07/10/2014   ALBUMIN 4.8 08/18/2013   LABURIC 4.0 10/07/2017   LABURIC 4.8 08/18/2013    No results found for: MG Lab Results  Component Value Date   VD25OH 45.1 02/26/2016    No results found for: PREALBUMIN CBC EXTENDED Latest Ref Rng & Units 04/18/2020 10/07/2017 09/25/2016  WBC 4.0 - 10.5 K/uL 5.4 5.2 6.8  RBC 3.87 - 5.11 MIL/uL 5.08 4.61 4.75  HGB 12.0 - 15.0 g/dL 16.2(H) 14.4 15.3  HCT 36.0 - 46.0 % 48.3(H) 42.3 43.6  PLT 150 - 400 K/uL 287 267 -  NEUTROABS 1.4 - 7.0 x10E3/uL - 2.8 -  LYMPHSABS 0.7 - 3.1 x10E3/uL - 2.1 -     There is no height or weight on file to calculate BMI.  Orders:  Orders Placed This Encounter  Procedures  . XR Foot Complete Left  . XR Ankle Complete Left   No orders of the defined types were placed in this encounter.    Procedures: No procedures performed  Clinical Data: No additional findings.  ROS:  All other systems negative, except as noted in the HPI. Review of Systems  Objective: Vital Signs: LMP 06/17/2020   Specialty Comments:  No specialty comments available.  PMFS History: Patient Active Problem List   Diagnosis Date Noted  . Lisfranc dislocation, left, sequela   . Ankle syndesmosis disruption, left, sequela   . Generalized anxiety disorder 11/03/2017  . Neuropathy 11/04/2016  . Paresthesias 11/04/2016   Past Medical History:  Diagnosis Date  . Anxiety   . Arthritis   . Asthma   . Complication of anesthesia    with endoscopic spoke profeaty  . Depression   . Family history of adverse reaction to anesthesia    sister- vommitting  . Headache    magraines in the past  . Neuropathy    feet mainly- hands some  . Seasonal allergies     Family History  Problem Relation Age of Onset  . Skin cancer Mother   . Asthma Son   . Seizures Maternal Grandmother   .  Cancer Maternal Grandmother   . Stroke Maternal Grandmother   . Neuropathy Maternal Grandfather   . Diabetic kidney disease Maternal Grandfather   . Diabetes Maternal Grandfather   . Heart disease Paternal Grandfather   . Hyperlipidemia Paternal Grandfather   . Hypertension Paternal Grandfather     Past Surgical History:  Procedure Laterality Date  . BREAST SURGERY Bilateral    Breast Enhancement  . ESOPHAGOGASTRODUODENOSCOPY     x 3  last one approx 2002  . KNEE SURGERY Right    arthroscopy- cartilage  . ORIF ANKLE FRACTURE Left 04/18/2020   Procedure: OPEN REDUCTION INTERNAL FIXATION (ORIF) SYNDESMOSIS LEFT ANKLE AND LISFRANC JOINT LEFT FOOT;  Surgeon: Nadara Mustard, MD;  Location: [facility] OR;  Service: Orthopedics;  Laterality: Left;   Social History   Occupational History  . Not on file  Tobacco Use  . Smoking status: Former Games developer  . Smokeless tobacco: Never Used  Substance and Sexual Activity  . Alcohol use: Yes    Alcohol/week: 1.0 standard drink    Types: 1 Glasses of wine per week    Comment: occcasionally  . Drug use: No  . Sexual activity: Not on file

## 2020-07-06 ENCOUNTER — Other Ambulatory Visit: Payer: Self-pay

## 2020-07-06 ENCOUNTER — Encounter: Payer: Self-pay | Admitting: Family Medicine

## 2020-07-06 ENCOUNTER — Ambulatory Visit (INDEPENDENT_AMBULATORY_CARE_PROVIDER_SITE_OTHER): Payer: 59 | Admitting: Family Medicine

## 2020-07-06 VITALS — BP 128/80 | HR 78 | Temp 98.4°F | Ht 65.0 in | Wt 191.0 lb

## 2020-07-06 DIAGNOSIS — F411 Generalized anxiety disorder: Secondary | ICD-10-CM | POA: Diagnosis not present

## 2020-07-06 DIAGNOSIS — Z1211 Encounter for screening for malignant neoplasm of colon: Secondary | ICD-10-CM

## 2020-07-06 DIAGNOSIS — Z0000 Encounter for general adult medical examination without abnormal findings: Secondary | ICD-10-CM

## 2020-07-06 DIAGNOSIS — Z1159 Encounter for screening for other viral diseases: Secondary | ICD-10-CM

## 2020-07-06 DIAGNOSIS — E785 Hyperlipidemia, unspecified: Secondary | ICD-10-CM

## 2020-07-06 DIAGNOSIS — Z0001 Encounter for general adult medical examination with abnormal findings: Secondary | ICD-10-CM

## 2020-07-06 DIAGNOSIS — L139 Bullous disorder, unspecified: Secondary | ICD-10-CM

## 2020-07-06 DIAGNOSIS — F418 Other specified anxiety disorders: Secondary | ICD-10-CM

## 2020-07-06 MED ORDER — ALPRAZOLAM 0.25 MG PO TABS: 0.2500 mg | ORAL_TABLET | Freq: Two times a day (BID) | ORAL | 0 refills | Status: AC | PRN

## 2020-07-06 MED ORDER — ESCITALOPRAM OXALATE 20 MG PO TABS: 20.0000 mg | ORAL_TABLET | Freq: Every day | ORAL | 1 refills | Status: DC

## 2020-07-06 NOTE — Progress Notes (Signed)
Subjective:  Patient ID: Ann Sampson, female    DOB: Apr 08, 1976  Age: 45 y.o. MRN: [id number]  CC:  Chief Complaint  Patient presents with  . Annual Exam    Pt reports she feels well with no complaints. Pt is currently fasting.    HPI Ann Sampson presents for   Annual physical exam.  Follow-up of bullous dermatitis Seen in January 5.  Started on high-dose topical corticosteroid, doxycycline for possible bullous pemphigoid versus insect bites.  Referred to dermatology.  Has not tolerated oral steroids so initially deferred. Saw dermatology yesterday, had biopsy. New cream prescribed. New body wash.  Has been under the care of orthopedics and physical therapy for left ankle/foot issues.  Generalized anxiety disorder Lexapro 20 mg daily.  Discussed in August, had previously discussed therapy but that has been deferred.  Felt like that made things worse.  Symptoms were overall stable on Lexapro 20 mg, continued same. Working ok at current dose, but still some persistent anxiety during the day. Stress with return to work, more anxious past 2 weeks. Anticipation of return to work.  peloton bike has helped - daily since christmas.  GAD 7 : Generalized Anxiety Score 02/15/2020 08/10/2019 02/07/2019  Nervous, Anxious, on Edge 3 3 0  Control/stop worrying 2 2 3   Worry too much - different things 2 2 3   Trouble relaxing 1 2 3   Restless 1 2 1   Easily annoyed or irritable 0 2 0  Afraid - awful might happen 0 0 0  Total GAD 7 Score 9 13 10    Cancer screening Colon: would like to wait and decide, but requests cologuard..  Screening options with colonoscopy versus Cologuard discussed. Discussed timing of repeat testing intervals if normal, as well as potential need for diagnostic Colonoscopy if positive Cologuard. Understanding expressed, and chose Cologuard.   Breast cancer screening -mammogram 2018. Pap testing November 2019  Declined sti testing.   Immunization History   Administered Date(s) Administered  . Influenza Split 03/30/2014  . Influenza,inj,Quad PF,6+ Mos 03/23/2020  . Influenza-Unspecified 03/30/2016, 03/30/2017, 04/08/2018  . PFIZER SARS-COV-2 Vaccination 07/22/2019, 08/12/2019, 06/08/2020  . Tdap 02/07/2019  COVID-vaccine booster:  Hep C antibody ordered today.  Hyperlipidemia: No current meds.  Lipids elevated in 2019.  Dietary control initially recommended. Lab Results  Component Value Date   CHOL 245 (H) 05/05/2018   HDL 60 05/05/2018   LDLCALC 162 (H) 05/05/2018   TRIG 116 05/05/2018   CHOLHDL 4.1 05/05/2018   Lab Results  Component Value Date   ALT 9 10/07/2017   AST 18 10/07/2017   ALKPHOS 50 10/07/2017   BILITOT 0.3 10/07/2017      Depression screen PHQ 2/9 07/06/2020 07/04/2020 02/15/2020 08/10/2019 02/07/2019  Decreased Interest 0 0 0 0 0  Down, Depressed, Hopeless 0 0 0 0 0  PHQ - 2 Score 0 0 0 0 0   No exam data present  Wears contact lenses, plans on scheduling appt.   Dental - every 6 months. 2-3 weeks ago.   Exercise: peloton bike 5- 6 days per week, and yoga/meditiation on other days. Helping with ankle as well.   History Patient Active Problem List   Diagnosis Date Noted  . Lisfranc dislocation, left, sequela   . Ankle syndesmosis disruption, left, sequela   . Generalized anxiety disorder 11/03/2017  . Neuropathy 11/04/2016  . Paresthesias 11/04/2016   Past Medical History:  Diagnosis Date  . Anxiety   . Arthritis   . Asthma   .  Complication of anesthesia    with endoscopic spoke profeaty  . Depression   . Family history of adverse reaction to anesthesia    sister- vommitting  . Headache    magraines in the past  . Neuropathy    feet mainly- hands some  . Seasonal allergies    Past Surgical History:  Procedure Laterality Date  . BREAST SURGERY Bilateral    Breast Enhancement  . ESOPHAGOGASTRODUODENOSCOPY     x 3  last one approx 2002  . KNEE SURGERY Right    arthroscopy- cartilage  .  ORIF ANKLE FRACTURE Left 04/18/2020   Procedure: OPEN REDUCTION INTERNAL FIXATION (ORIF) SYNDESMOSIS LEFT ANKLE AND LISFRANC JOINT LEFT FOOT;  Surgeon: Nadara Mustard, MD;  Location: [facility] OR;  Service: Orthopedics;  Laterality: Left;   Allergies  Allergen Reactions  . Bee Venom Anaphylaxis  . Hornet Venom Anaphylaxis  . Latex Hives and Shortness Of Breath  . Prednisone Anaphylaxis and Rash  . Chap-Aid [Chapstick] Swelling    Swelling of lips  . Bextra [Valdecoxib] Rash  . Flagyl [Metronidazole] Rash  . Vioxx [Rofecoxib] Rash   Prior to Admission medications   Medication Sig Start Date End Date Taking? Authorizing Provider  aspirin EC 81 MG tablet Take 1 tablet (81 mg total) by mouth daily. Swallow whole. 04/18/20 04/18/21 Yes Persons, [place], PA  clobetasol cream (TEMOVATE) 0.05 % Apply 1 application topically 2 (two) times daily. To affected lesions 07/04/20  Yes Shade Flood, MD  COLLAGEN PO Take 15 g by mouth daily.   Yes [provider]  doxycycline (VIBRA-TABS) 100 MG tablet Take 1 tablet (100 mg total) by mouth 2 (two) times daily. 07/04/20  Yes Shade Flood, MD  Emollient (ROC RETINOL CORREXION) CREA Apply 1 application topically every other day.   Yes [provider]  EPINEPHrine 0.3 mg/0.3 mL IJ SOAJ injection USE AS DIRECTED Patient taking differently: Inject 0.3 mg into the muscle as needed for anaphylaxis. 08/10/19  Yes Shade Flood, MD  escitalopram (LEXAPRO) 20 MG tablet Take 1 tablet (20 mg total) by mouth daily. 02/15/20  Yes Shade Flood, MD  gabapentin (NEURONTIN) 300 MG capsule Take 1 capsule (300 mg total) by mouth at bedtime. 12/01/19  Yes McCue, Shanda Bumps, NP  Multiple Vitamins-Minerals (ADULT GUMMY PO) Take 2 capsules by mouth daily.    Yes [provider]  mupirocin ointment (BACTROBAN) 2 % SMARTSIG:1 Application Topical 2-3 Times Daily 07/05/20  Yes [provider]  triamcinolone (KENALOG) 0.1 % Apply 1 application  topically 2 (two) times daily. Patient not taking: Reported on 07/06/2020 06/30/20   Mardella Layman, MD   Social History   Socioeconomic History  . Marital status: Unknown    Spouse name: Not on file  . Number of children: Not on file  . Years of education: Not on file  . Highest education level: Not on file  Occupational History  . Not on file  Tobacco Use  . Smoking status: Former Games developer  . Smokeless tobacco: Never Used  Vaping Use  . Vaping Use: Never used  Substance and Sexual Activity  . Alcohol use: Yes    Alcohol/week: 1.0 standard drink    Types: 1 Glasses of wine per week    Comment: occcasionally  . Drug use: No  . Sexual activity: Not Currently  Other Topics Concern  . Not on file  Social History Narrative  . Not on file   Social Determinants of Health  Financial Resource Strain: Not on file  Food Insecurity: Not on file  Transportation Needs: Not on file  Physical Activity: Not on file  Stress: Not on file  Social Connections: Not on file  Intimate Partner Violence: Not on file    Review of Systems 13 point review of systems per patient health survey noted.  Negative other than as indicated above or in HPI.    Objective:   Vitals:   07/06/20 1011  BP: 128/80  Pulse: 78  Temp: 98.4 F (36.9 C)  TempSrc: Temporal  SpO2: 97%  Weight: 191 lb (86.6 kg)  Height: 5\' 5"  (1.651 m)     Physical Exam Constitutional:      Appearance: She is well-developed and well-nourished.  HENT:     Head: Normocephalic and atraumatic.     Right Ear: External ear normal.     Left Ear: External ear normal.     Mouth/Throat:     Mouth: Oropharynx is clear and moist.  Eyes:     Conjunctiva/sclera: Conjunctivae normal.     Pupils: Pupils are equal, round, and reactive to light.  Neck:     Thyroid: No thyromegaly.  Cardiovascular:     Rate and Rhythm: Normal rate and regular rhythm.     Pulses: Intact distal pulses.     Heart sounds: Normal heart sounds. No murmur  heard.   Pulmonary:     Effort: Pulmonary effort is normal. No respiratory distress.     Breath sounds: Normal breath sounds. No wheezing.  Abdominal:     General: Bowel sounds are normal.     Palpations: Abdomen is soft.     Tenderness: There is no abdominal tenderness.  Musculoskeletal:        General: No tenderness or edema. Normal range of motion.     Cervical back: Normal range of motion and neck supple.  Lymphadenopathy:     Cervical: No cervical adenopathy.  Skin:    General: Skin is warm and dry.     Findings: No rash.  Neurological:     Mental Status: She is alert and oriented to person, place, and time.  Psychiatric:        Mood and Affect: Mood and affect and mood normal.        Behavior: Behavior normal.        Thought Content: Thought content normal.        Assessment & Plan:  Ann Sampson is a 45 y.o. female . Annual physical exam  - -anticipatory guidance as below in AVS, screening labs above. Health maintenance items as above in HPI discussed/recommended as applicable.   Encounter for hepatitis C screening test for low risk patient - Plan: Hepatitis C antibody  Situational anxiety - Plan: ALPRAZolam (XANAX) 0.25 MG tablet Generalized anxiety disorder - Plan: escitalopram (LEXAPRO) 20 MG tablet  -Situational anxiety component with upcoming return to work.  Temporary prescription for alprazolam but discussed only for current breakthrough symptoms, not a long-term solution.  We will continue same dose Lexapro for now.  Hyperlipidemia, unspecified hyperlipidemia type - Plan: Lipid panel, Comprehensive metabolic panel  -Check labs, ASCVD risk scoring.  Bullous dermatitis  -Under the care of dermatology, with new treatments and recent biopsy obtained.  Special screening for malignant neoplasms, colon - Plan: Cologuard  -As above, Cologuard ordered.  Meds ordered this encounter  Medications  . ALPRAZolam (XANAX) 0.25 MG tablet    Sig: Take 1 tablet  (0.25 mg total) by mouth 2 (  two) times daily as needed for anxiety.    Dispense:  20 tablet    Refill:  0  . escitalopram (LEXAPRO) 20 MG tablet    Sig: Take 1 tablet (20 mg total) by mouth daily.    Dispense:  90 tablet    Refill:  1   Patient Instructions   Continue lexapro same dose. Xanax temporarily for anxiety with return to work. Follow up if not improving in next few weeks.   We recommend that you schedule a mammogram for breast cancer screening. Typically, you do not need a referral to do this. [facility]) - [phone] or [phone]   Keeping You Healthy  Get These Tests 1. Blood Pressure- Have your blood pressure checked once a year by your health care provider.  Normal blood pressure is 120/80. 2. Weight- Have your body mass index (BMI) calculated to screen for obesity.  BMI is measure of body fat based on height and weight.  You can also calculate your own BMI at [email or web address].west-esparza.com/. 3. Cholesterol- Have your cholesterol checked every 5 years starting at age 60 then yearly starting at age 33. 4. Chlamydia, HIV, and other sexually transmitted diseases- Get screened every year until age 49, then within three months of each new sexual provider. 5. Pap Test - Every 1-5 years; discuss with your health care provider. 6. Mammogram- Every 1-2 years starting at age 57--50  Take these medicines  Calcium with Vitamin D-Your body needs 1200 mg of Calcium each day and [phone] IU of Vitamin D daily.  Your body can only absorb 500 mg of Calcium at a time so Calcium must be taken in 2 or 3 divided doses throughout the day.  Multivitamin with folic acid- Once daily if it is possible for you to become pregnant.  Get these Immunizations  Gardasil-Series of three doses; prevents HPV related illness such as genital warts and cervical cancer.  Menactra-Single dose; prevents meningitis.  Tetanus shot- Every 10 years.  Flu shot-Every year.  Take  these steps 1. Do not smoke-Your healthcare provider can help you quit.  For tips on how to quit go to www.smokefree.gov or call 1-800 QUITNOW. 2. Be physically active- Exercise 5 days a week for at least 30 minutes.  If you are not already physically active, start slow and gradually work up to 30 minutes of moderate physical activity.  Examples of moderate activity include walking briskly, dancing, swimming, bicycling, etc. 3. Breast Cancer- A self breast exam every month is important for early detection of breast cancer.  For more information and instruction on self breast exams, ask your healthcare provider or [email or web address]. 4. Eat a healthy diet- Eat a variety of healthy foods such as fruits, vegetables, whole grains, low fat milk, low fat cheeses, yogurt, lean meats, poultry and fish, beans, nuts, tofu, etc.  For more information go to www. Thenutritionsource.org 5. Drink alcohol in moderation- Limit alcohol intake to one drink or less per day. Never drink and drive. 6. Depression- Your emotional health is as important as your physical health.  If you're feeling down or losing interest in things you normally enjoy please talk to your healthcare provider about being screened for depression. 7. Dental visit- Brush and floss your teeth twice daily; visit your dentist twice a year. 8. Eye doctor- Get an eye exam at least every 2 years. 9. Helmet use- Always wear a helmet when riding a bicycle, motorcycle, rollerblading or skateboarding. 10. Safe sex- If you  may be exposed to sexually transmitted infections, use a condom. 11. Seat belts- Seat belts can save your live; always wear one. 12. Smoke/Carbon Monoxide detectors- These detectors need to be installed on the appropriate level of your home. Replace batteries at least once a year. 13. Skin cancer- When out in the sun please cover up and use sunscreen 15 SPF or higher. 14. Violence- If anyone is threatening or hurting  you, please tell your healthcare provider.          If you have lab work done today you will be contacted with your lab results within the next 2 weeks.  If you have not heard from [place] then please contact us. The fastest way to get your results is to register for My Chart.   IF you received an x-ray today, you will receive an invoice from [facility]. Please contact [facility] at [phone] with questions or concerns regarding your invoice.   IF you received labwork today, you will receive an invoice from [place]. Please contact [facility] at [phone] with questions or concerns regarding your invoice.   Our billing staff will not be able to assist you with questions regarding bills from these companies.  You will be contacted with the lab results as soon as they are available. The fastest way to get your results is to activate your My Chart account. Instructions are located on the last page of this paperwork. If you have not heard from [place] regarding the results in 2 weeks, please contact this office.         Signed, [name], MD [facility]

## 2020-07-06 NOTE — Patient Instructions (Addendum)
Continue lexapro same dose. Xanax temporarily for anxiety with return to work. Follow up if not improving in next few weeks.   We recommend that you schedule a mammogram for breast cancer screening. Typically, you do not need a referral to do this. [facility]) - [phone] or [phone]   Keeping You Healthy  Get These Tests 1. Blood Pressure- Have your blood pressure checked once a year by your health care provider.  Normal blood pressure is 120/80. 2. Weight- Have your body mass index (BMI) calculated to screen for obesity.  BMI is measure of body fat based on height and weight.  You can also calculate your own BMI at https://www.west-esparza.com/. 3. Cholesterol- Have your cholesterol checked every 5 years starting at age 54 then yearly starting at age 22. 4. Chlamydia, HIV, and other sexually transmitted diseases- Get screened every year until age 68, then within three months of each new sexual provider. 5. Pap Test - Every 1-5 years; discuss with your health care provider. 6. Mammogram- Every 1-2 years starting at age 38--50  Take these medicines  Calcium with Vitamin D-Your body needs 1200 mg of Calcium each day and [phone] IU of Vitamin D daily.  Your body can only absorb 500 mg of Calcium at a time so Calcium must be taken in 2 or 3 divided doses throughout the day.  Multivitamin with folic acid- Once daily if it is possible for you to become pregnant.  Get these Immunizations  Gardasil-Series of three doses; prevents HPV related illness such as genital warts and cervical cancer.  Menactra-Single dose; prevents meningitis.  Tetanus shot- Every 10 years.  Flu shot-Every year.  Take these steps 1. Do not smoke-Your healthcare provider can help you quit.  For tips on how to quit go to www.smokefree.gov or call 1-800 QUITNOW. 2. Be physically active- Exercise 5 days a week for at least 30 minutes.  If you are not already physically active, start  slow and gradually work up to 30 minutes of moderate physical activity.  Examples of moderate activity include walking briskly, dancing, swimming, bicycling, etc. 3. Breast Cancer- A self breast exam every month is important for early detection of breast cancer.  For more information and instruction on self breast exams, ask your healthcare provider or [place]. 4. Eat a healthy diet- Eat a variety of healthy foods such as fruits, vegetables, whole grains, low fat milk, low fat cheeses, yogurt, lean meats, poultry and fish, beans, nuts, tofu, etc.  For more information go to www. Thenutritionsource.org 5. Drink alcohol in moderation- Limit alcohol intake to one drink or less per day. Never drink and drive. 6. Depression- Your emotional health is as important as your physical health.  If you're feeling down or losing interest in things you normally enjoy please talk to your healthcare provider about being screened for depression. 7. Dental visit- Brush and floss your teeth twice daily; visit your dentist twice a year. 8. Eye doctor- Get an eye exam at least every 2 years. 9. Helmet use- Always wear a helmet when riding a bicycle, motorcycle, rollerblading or skateboarding. 10. Safe sex- If you may be exposed to sexually transmitted infections, use a condom. 11. Seat belts- Seat belts can save your live; always wear one. 12. Smoke/Carbon Monoxide detectors- These detectors need to be installed on the appropriate level of your home. Replace batteries at least once a year. 13. Skin cancer- When out in the sun please cover up and use  sunscreen 15 SPF or higher. 14. Violence- If anyone is threatening or hurting you, please tell your healthcare provider.          If you have lab work done today you will be contacted with your lab results within the next 2 weeks.  If you have not heard from [place] then please contact us. The fastest way to get your results is to  register for My Chart.   IF you received an x-ray today, you will receive an invoice from [facility]. Please contact [facility] at [phone] with questions or concerns regarding your invoice.   IF you received labwork today, you will receive an invoice from [place]. Please contact [facility] at [phone] with questions or concerns regarding your invoice.   Our billing staff will not be able to assist you with questions regarding bills from these companies.  You will be contacted with the lab results as soon as they are available. The fastest way to get your results is to activate your My Chart account. Instructions are located on the last page of this paperwork. If you have not heard from [place] regarding the results in 2 weeks, please contact this office.

## 2020-07-07 LAB — LIPID PANEL
Chol/HDL Ratio: 4.4 ratio (ref 0.0–4.4)
Cholesterol, Total: 281 mg/dL — ABNORMAL HIGH (ref 100–199)
HDL: 64 mg/dL (ref >39)
LDL Chol Calc (NIH): 199 mg/dL — ABNORMAL HIGH (ref 0–99)
Triglycerides: 105 mg/dL (ref 0–149)
VLDL Cholesterol Cal: 18 mg/dL (ref 5–40)

## 2020-07-07 LAB — COMPREHENSIVE METABOLIC PANEL
ALT: 18 IU/L (ref 0–32)
AST: 19 IU/L (ref 0–40)
Albumin/Globulin Ratio: 2 (ref 1.2–2.2)
Albumin: 4.7 g/dL (ref 3.8–4.8)
Alkaline Phosphatase: 61 IU/L (ref 44–121)
BUN/Creatinine Ratio: 13 (ref 9–23)
BUN: 10 mg/dL (ref 6–24)
Bilirubin Total: 0.3 mg/dL (ref 0.0–1.2)
CO2: 25 mmol/L (ref 20–29)
Calcium: 10 mg/dL (ref 8.7–10.2)
Chloride: 98 mmol/L (ref 96–106)
Creatinine, Ser: 0.78 mg/dL (ref 0.57–1.00)
GFR calc Af Amer: 106 mL/min/{1.73_m2} (ref >59)
GFR calc non Af Amer: 92 mL/min/{1.73_m2} (ref >59)
Globulin, Total: 2.4 g/dL (ref 1.5–4.5)
Glucose: 107 mg/dL — ABNORMAL HIGH (ref 65–99)
Potassium: 4.6 mmol/L (ref 3.5–5.2)
Sodium: 138 mmol/L (ref 134–144)
Total Protein: 7.1 g/dL (ref 6.0–8.5)

## 2020-07-07 LAB — HEPATITIS C ANTIBODY: Hep C Virus Ab: <0.1 s/co ratio (ref 0.0–0.9)

## 2020-07-09 ENCOUNTER — Other Ambulatory Visit: Payer: Self-pay

## 2020-07-09 ENCOUNTER — Ambulatory Visit: Payer: 59 | Admitting: Physical Therapy

## 2020-07-09 ENCOUNTER — Encounter: Payer: Self-pay | Admitting: Physical Therapy

## 2020-07-09 DIAGNOSIS — R262 Difficulty in walking, not elsewhere classified: Secondary | ICD-10-CM | POA: Diagnosis not present

## 2020-07-09 DIAGNOSIS — M6281 Muscle weakness (generalized): Secondary | ICD-10-CM | POA: Diagnosis not present

## 2020-07-09 DIAGNOSIS — R2689 Other abnormalities of gait and mobility: Secondary | ICD-10-CM

## 2020-07-09 DIAGNOSIS — M25572 Pain in left ankle and joints of left foot: Secondary | ICD-10-CM | POA: Diagnosis not present

## 2020-07-09 DIAGNOSIS — R296 Repeated falls: Secondary | ICD-10-CM

## 2020-07-09 DIAGNOSIS — M25672 Stiffness of left ankle, not elsewhere classified: Secondary | ICD-10-CM | POA: Diagnosis not present

## 2020-07-09 DIAGNOSIS — R600 Localized edema: Secondary | ICD-10-CM

## 2020-07-09 NOTE — Therapy (Signed)
[facility] [address] [place], [place], [address] Phone: [phone]   Fax:  [phone]  Physical Therapy Treatment  Patient Details  Name: Ann Sampson MRN: [id number] Date of Birth: 1975-08-15 Referring Provider (PT): Persons, Bevely Palmer, [place]   Encounter Date: 07/09/2020   PT End of Session - 07/09/20 1101    Visit Number 14    Number of Visits 16    Date for PT Re-Evaluation 07/16/20    Authorization Type [employer] OTHER    PT Start Time 1012    PT Stop Time 1059    PT Time Calculation (min) 47 min    Activity Tolerance No increased pain;Patient tolerated treatment well    Behavior During Therapy [facility] for tasks assessed/performed           Past Medical History:  Diagnosis Date  . Anxiety   . Arthritis   . Asthma   . Complication of anesthesia    with endoscopic spoke profeaty  . Depression   . Family history of adverse reaction to anesthesia    sister- vommitting  . Headache    magraines in the past  . Neuropathy    feet mainly- hands some  . Seasonal allergies     Past Surgical History:  Procedure Laterality Date  . BREAST SURGERY Bilateral    Breast Enhancement  . ESOPHAGOGASTRODUODENOSCOPY     x 3  last one approx 2002  . KNEE SURGERY Right    arthroscopy- cartilage  . ORIF ANKLE FRACTURE Left 04/18/2020   Procedure: OPEN REDUCTION INTERNAL FIXATION (ORIF) SYNDESMOSIS LEFT ANKLE AND LISFRANC JOINT LEFT FOOT;  Surgeon: Newt Minion, MD;  Location: [place];  Service: Orthopedics;  Laterality: Left;    There were no vitals filed for this visit.   Subjective Assessment - 07/09/20 1015    Subjective doing well - ankle is a little more painful today as she returned to work and is walking a lot more than she has    Limitations Standing;Walking;House hold activities    Patient Stated Goals improve mobility, return to baseline    Currently in Pain? Yes    Pain Score 5     Pain Location Ankle    Pain  Orientation Left    Pain Descriptors / Indicators Sharp;Aching    Pain Type Chronic pain    Pain Onset More than a month ago    Pain Frequency Intermittent    Aggravating Factors  weight bearing    Pain Relieving Factors elevation, ice, rest                             [facility] Adult PT Treatment/Exercise - 07/09/20 1018      Ankle Exercises: Aerobic   Recumbent Bike Seat 7 Level 4 for 8 minutes      Ankle Exercises: Stretches   Soleus Stretch 3 reps;30 seconds   slantboard   Gastroc Stretch 3 reps;30 seconds   slantboard   Other Stretch trial of stretch off step for comparison to slant board - min cues for weight shifting      Ankle Exercises: Standing   Other Standing Ankle Exercises SL deadlift 10# KB 2x10 bil    Other Standing Ankle Exercises tandem stand 3x30 sec bil                    PT Short Term Goals - 06/27/20 9735  PT SHORT TERM GOAL #1   Title Pt wil be Ind in an initial HEP.    Status Achieved    Target Date 06/18/20      PT SHORT TERM GOAL #2   Title improve Lt ankle ROM by 8 degrees all motions for improved function    Status Achieved    Target Date 06/18/20      PT SHORT TERM GOAL #3   Title amb with single crutch and boot (if still indicated) without increase in pain or significant gait deviations    Status Achieved    Target Date 06/18/20      PT SHORT TERM GOAL #4   Title negotiate stairs Mod I in order to return to sleeping in bedroom    Baseline 12/22: doing at home, still feels unsafe, supervision in clinic    Status Partially Met    Target Date 06/18/20             PT Long Term Goals - 06/27/20 1309      PT LONG TERM GOAL #1   Title independent with final HEP    Baseline 105, 93, 25, 45 respectively    Period Weeks    Status On-going      PT LONG TERM GOAL #2   Title demonstrate Lt ankle ROM to WNL for improved function    Baseline 5/10    Time 4    Period Weeks    Status On-going      PT LONG  TERM GOAL #3   Title demonstrate 3/5 Lt ankle strength for improved function    Baseline Much difficulty    Period Weeks    Status On-going      PT LONG TERM GOAL #4   Title amb without AD independently for improved function    Baseline 33 % limitation    Status On-going      PT LONG TERM GOAL #5   Title FOTO score improved to 58    Status On-going                 Plan - 07/09/20 1102    Clinical Impression Statement Pt tolerated session well today with decreased pain following session, with focus on increasing DF ROM and balance/strengthening.  Will continue to benefit from PT to maximize function.    Personal Factors and Comorbidities Comorbidity 1    Comorbidities arthritis    Examination-Activity Limitations Bathing;Squat;Stairs;Stand;Transfers;Locomotion Level;Lift    Stability/Clinical Decision Making Evolving/Moderate complexity    Rehab Potential Good    PT Frequency 2x / week    PT Duration 8 weeks    PT Treatment/Interventions ADLs/Self Care Home Management;Cryotherapy;Electrical Stimulation;Iontophoresis 46m/ml Dexamethasone;Moist Heat;Ultrasound;Therapeutic exercise;Therapeutic activities;Patient/family education;Manual techniques;Dry needling;Taping;Vasopneumatic Device;Balance training;Functional mobility training;Stair training;Gait training;DME Instruction;Neuromuscular re-education;Passive range of motion;Scar mobilization    PT Next Visit Plan Needs stair training (currently sleeping downstairs), Lt ankle AROM/strength/PROM,check FOTO    PT Home Exercise Plan Access Code: [email or web address]   Consulted and Agree with Plan of Care Patient           Patient will benefit from skilled therapeutic intervention in order to improve the following deficits and impairments:  Decreased range of motion,Abnormal gait,Decreased endurance,Increased edema,Decreased scar mobility,Decreased knowledge of precautions,Decreased knowledge of use of DME,Decreased activity  tolerance,Decreased mobility,Decreased balance,Difficulty walking,Decreased strength  Visit Diagnosis: Difficulty in walking, not elsewhere classified  Muscle weakness (generalized)  Stiffness of left ankle, not elsewhere classified  Pain in left ankle and joints of left foot  Localized edema  Other abnormalities of gait and mobility  Repeated falls     Problem List Patient Active Problem List   Diagnosis Date Noted  . Lisfranc dislocation, left, sequela   . Ankle syndesmosis disruption, left, sequela   . Generalized anxiety disorder 11/03/2017  . Neuropathy 11/04/2016  . Paresthesias 11/04/2016      Laureen Abrahams, PT, DPT 07/09/20 11:03 AM    [facility] Therapy [address] [place], [place], [address] Phone: [phone]   Fax:  [phone]  Name: LYNAE PEDERSON MRN: [id number] Date of Birth: 12-Jul-1975

## 2020-07-11 ENCOUNTER — Encounter: Payer: Self-pay | Admitting: Physical Therapy

## 2020-07-11 ENCOUNTER — Other Ambulatory Visit: Payer: Self-pay

## 2020-07-11 ENCOUNTER — Ambulatory Visit: Payer: 59 | Admitting: Physical Therapy

## 2020-07-11 DIAGNOSIS — M6281 Muscle weakness (generalized): Secondary | ICD-10-CM

## 2020-07-11 DIAGNOSIS — M25572 Pain in left ankle and joints of left foot: Secondary | ICD-10-CM

## 2020-07-11 DIAGNOSIS — R262 Difficulty in walking, not elsewhere classified: Secondary | ICD-10-CM

## 2020-07-11 DIAGNOSIS — R2689 Other abnormalities of gait and mobility: Secondary | ICD-10-CM

## 2020-07-11 DIAGNOSIS — M25672 Stiffness of left ankle, not elsewhere classified: Secondary | ICD-10-CM

## 2020-07-11 DIAGNOSIS — R600 Localized edema: Secondary | ICD-10-CM

## 2020-07-11 NOTE — Therapy (Signed)
[facility] Therapy [address] [place], [place], [address] Phone: [phone]   Fax:  [phone]  Physical Therapy Treatment  Patient Details  Name: Ann Sampson MRN: [id number] Date of Birth: Oct 22, 1975 Referring Provider (PT): Persons, Bevely Palmer, [place]   Encounter Date: 07/11/2020   PT End of Session - 07/11/20 1427    Visit Number 15    Number of Visits 16    Date for PT Re-Evaluation 07/16/20    Authorization Type [employer] OTHER    PT Start Time 8329    PT Stop Time 1426    PT Time Calculation (min) 41 min    Activity Tolerance No increased pain;Patient tolerated treatment well    Behavior During Therapy [facility] for tasks assessed/performed           Past Medical History:  Diagnosis Date  . Anxiety   . Arthritis   . Asthma   . Complication of anesthesia    with endoscopic spoke profeaty  . Depression   . Family history of adverse reaction to anesthesia    sister- vommitting  . Headache    magraines in the past  . Neuropathy    feet mainly- hands some  . Seasonal allergies     Past Surgical History:  Procedure Laterality Date  . BREAST SURGERY Bilateral    Breast Enhancement  . ESOPHAGOGASTRODUODENOSCOPY     x 3  last one approx 2002  . KNEE SURGERY Right    arthroscopy- cartilage  . ORIF ANKLE FRACTURE Left 04/18/2020   Procedure: OPEN REDUCTION INTERNAL FIXATION (ORIF) SYNDESMOSIS LEFT ANKLE AND LISFRANC JOINT LEFT FOOT;  Surgeon: Newt Minion, MD;  Location: [place];  Service: Orthopedics;  Laterality: Left;    There were no vitals filed for this visit.   Subjective Assessment - 07/11/20 1347    Subjective sore from exercises, but doing well.    Limitations Standing;Walking;House hold activities    Patient Stated Goals improve mobility, return to baseline    Currently in Pain? Yes    Pain Location Ankle    Pain Orientation Left    Pain Descriptors / Indicators Aching;Sharp    Pain Type Chronic pain     Pain Onset More than a month ago    Pain Frequency Intermittent    Aggravating Factors  weight bearing    Pain Relieving Factors elevation, ice, rest                             [facility] Adult PT Treatment/Exercise - 07/11/20 1348      Ankle Exercises: Aerobic   Recumbent Bike Seat 7 Level 4 for 8 minutes      Ankle Exercises: Stretches   Soleus Stretch 3 reps;30 seconds   slantboard   Gastroc Stretch 3 reps;30 seconds   slantboard     Ankle Exercises: Standing   SLS LLE on foam with ball toss - light touch on Rt toe    Other Standing Ankle Exercises single leg step down LLE on 2# step with Rt heel tap to floor - keeping foot on step x20 reps      Ankle Exercises: Machines for Strengthening   Cybex Leg Press 50# x 10 reps; then transition to jumping both feet with 50#; LLE only 25# jumping with cues for technique                    PT Short Term Goals -  06/27/20 0919      PT SHORT TERM GOAL #1   Title Pt wil be Ind in an initial HEP.    Status Achieved    Target Date 06/18/20      PT SHORT TERM GOAL #2   Title improve Lt ankle ROM by 8 degrees all motions for improved function    Status Achieved    Target Date 06/18/20      PT SHORT TERM GOAL #3   Title amb with single crutch and boot (if still indicated) without increase in pain or significant gait deviations    Status Achieved    Target Date 06/18/20      PT SHORT TERM GOAL #4   Title negotiate stairs Mod I in order to return to sleeping in bedroom    Baseline 12/22: doing at home, still feels unsafe, supervision in clinic    Status Partially Met    Target Date 06/18/20             PT Long Term Goals - 06/27/20 1309      PT LONG TERM GOAL #1   Title independent with final HEP    Baseline 105, 93, 25, 45 respectively    Period Weeks    Status On-going      PT LONG TERM GOAL #2   Title demonstrate Lt ankle ROM to WNL for improved function    Baseline 5/10    Time 4    Period  Weeks    Status On-going      PT LONG TERM GOAL #3   Title demonstrate 3/5 Lt ankle strength for improved function    Baseline Much difficulty    Period Weeks    Status On-going      PT LONG TERM GOAL #4   Title amb without AD independently for improved function    Baseline 33 % limitation    Status On-going      PT LONG TERM GOAL #5   Title FOTO score improved to 58    Status On-going                 Plan - 07/11/20 1427    Clinical Impression Statement Progressing well with PT, tolerating supine plyometric work today without significant increase in pain.  Will continue to benefit from PT to maximize function.    Personal Factors and Comorbidities Comorbidity 1    Comorbidities arthritis    Examination-Activity Limitations Bathing;Squat;Stairs;Stand;Transfers;Locomotion Level;Lift    Stability/Clinical Decision Making Evolving/Moderate complexity    Rehab Potential Good    PT Frequency 2x / week    PT Duration 8 weeks    PT Treatment/Interventions ADLs/Self Care Home Management;Cryotherapy;Electrical Stimulation;Iontophoresis 72m/ml Dexamethasone;Moist Heat;Ultrasound;Therapeutic exercise;Therapeutic activities;Patient/family education;Manual techniques;Dry needling;Taping;Vasopneumatic Device;Balance training;Functional mobility training;Stair training;Gait training;DME Instruction;Neuromuscular re-education;Passive range of motion;Scar mobilization    PT Next Visit Plan Needs stair training (currently sleeping downstairs), Lt ankle AROM/strength/PROM,check FOTO    PT Home Exercise Plan Access Code: [email or web address]   Consulted and Agree with Plan of Care Patient           Patient will benefit from skilled therapeutic intervention in order to improve the following deficits and impairments:  Decreased range of motion,Abnormal gait,Decreased endurance,Increased edema,Decreased scar mobility,Decreased knowledge of precautions,Decreased knowledge of use of DME,Decreased activity  tolerance,Decreased mobility,Decreased balance,Difficulty walking,Decreased strength  Visit Diagnosis: Difficulty in walking, not elsewhere classified  Muscle weakness (generalized)  Stiffness of left ankle, not elsewhere classified  Pain in left ankle and joints of  left foot  Localized edema  Other abnormalities of gait and mobility     Problem List Patient Active Problem List   Diagnosis Date Noted  . Lisfranc dislocation, left, sequela   . Ankle syndesmosis disruption, left, sequela   . Generalized anxiety disorder 11/03/2017  . Neuropathy 11/04/2016  . Paresthesias 11/04/2016     Laureen Abrahams, PT, DPT 07/11/20 2:28 PM    [facility] [address] [place], [place], [address] Phone: [phone]   Fax:  [phone]  Name: Ann Sampson MRN: [id number] Date of Birth: [birth year]

## 2020-07-15 ENCOUNTER — Encounter: Payer: Self-pay | Admitting: Family Medicine

## 2020-07-16 ENCOUNTER — Encounter: Payer: 59 | Admitting: Physical Therapy

## 2020-07-16 NOTE — Telephone Encounter (Signed)
Pt would like to try natural method first

## 2020-07-18 ENCOUNTER — Encounter: Payer: 59 | Admitting: Rehabilitative and Restorative Service Providers"

## 2020-07-23 ENCOUNTER — Ambulatory Visit: Payer: 59 | Admitting: Physical Therapy

## 2020-07-23 ENCOUNTER — Other Ambulatory Visit: Payer: Self-pay

## 2020-07-23 DIAGNOSIS — M25572 Pain in left ankle and joints of left foot: Secondary | ICD-10-CM | POA: Diagnosis not present

## 2020-07-23 DIAGNOSIS — R2689 Other abnormalities of gait and mobility: Secondary | ICD-10-CM

## 2020-07-23 DIAGNOSIS — M25672 Stiffness of left ankle, not elsewhere classified: Secondary | ICD-10-CM | POA: Diagnosis not present

## 2020-07-23 DIAGNOSIS — R262 Difficulty in walking, not elsewhere classified: Secondary | ICD-10-CM

## 2020-07-23 DIAGNOSIS — M6281 Muscle weakness (generalized): Secondary | ICD-10-CM

## 2020-07-23 DIAGNOSIS — R600 Localized edema: Secondary | ICD-10-CM

## 2020-07-23 DIAGNOSIS — R296 Repeated falls: Secondary | ICD-10-CM

## 2020-07-23 NOTE — Therapy (Signed)
[facility] [address] [place], [place], [address] Phone: [phone]   Fax:  [phone]  Physical Therapy Treatment/Recertification  Patient Details  Name: Ann Sampson MRN: [id number] Date of Birth: 1976/02/23 Referring Provider (PT): Persons, Bevely Palmer, [place]   Encounter Date: 07/23/2020   PT End of Session - 07/23/20 1553    Visit Number 16    Number of Visits 32    Date for PT Re-Evaluation 07/16/20    Authorization Type [employer] OTHER    PT Start Time 1512    PT Stop Time 1553    PT Time Calculation (min) 41 min    Activity Tolerance No increased pain;Patient tolerated treatment well    Behavior During Therapy [facility] for tasks assessed/performed           Past Medical History:  Diagnosis Date  . Anxiety   . Arthritis   . Asthma   . Complication of anesthesia    with endoscopic spoke profeaty  . Depression   . Family history of adverse reaction to anesthesia    sister- vommitting  . Headache    magraines in the past  . Neuropathy    feet mainly- hands some  . Seasonal allergies     Past Surgical History:  Procedure Laterality Date  . BREAST SURGERY Bilateral    Breast Enhancement  . ESOPHAGOGASTRODUODENOSCOPY     x 3  last one approx 2002  . KNEE SURGERY Right    arthroscopy- cartilage  . ORIF ANKLE FRACTURE Left 04/18/2020   Procedure: OPEN REDUCTION INTERNAL FIXATION (ORIF) SYNDESMOSIS LEFT ANKLE AND LISFRANC JOINT LEFT FOOT;  Surgeon: Newt Minion, MD;  Location: [place];  Service: Orthopedics;  Laterality: Left;    There were no vitals filed for this visit.   Subjective Assessment - 07/23/20 1516    Subjective ankle has been doing well - hurting a little today but otherwise doing well    Limitations Standing;Walking;House hold activities    Patient Stated Goals improve mobility, return to baseline    Currently in Pain? Yes    Pain Score 3     Pain Location Ankle    Pain Orientation Left    Pain  Descriptors / Indicators Aching;Sharp    Pain Type Chronic pain    Pain Onset More than a month ago    Pain Frequency Intermittent    Aggravating Factors  weight bearing    Pain Relieving Factors elevation, ice, rest              [facility] PT Assessment - 07/23/20 1528      Assessment   Medical Diagnosis Status post ORIF Syndesmosis and Lis Franc fracture    Referring Provider (PT) Persons, Bevely Palmer, [place]      Observation/Other Assessments   Focus on Therapeutic Outcomes (FOTO)  48      ROM / Strength   AROM / PROM / Strength Strength      AROM   Left Ankle Dorsiflexion 11    Left Ankle Plantar Flexion 42    Left Ankle Inversion 22    Left Ankle Eversion 20      Strength   Strength Assessment Site Ankle    Right/Left Ankle Left    Left Ankle Dorsiflexion 5/5    Left Ankle Plantar Flexion 2+/5    Left Ankle Inversion 2+/5    Left Ankle Eversion 4+/5  [facility] Adult PT Treatment/Exercise - 07/23/20 1517      Ankle Exercises: Aerobic   Nustep L6 x 8 min      Ankle Exercises: Machines for Strengthening   Cybex Leg Press LLE jumping 31# x 20-30; LLE calf raises x 20 31#                    PT Short Term Goals - 06/27/20 0919      PT SHORT TERM GOAL #1   Title Pt wil be Ind in an initial HEP.    Status Achieved    Target Date 06/18/20      PT SHORT TERM GOAL #2   Title improve Lt ankle ROM by 8 degrees all motions for improved function    Status Achieved    Target Date 06/18/20      PT SHORT TERM GOAL #3   Title amb with single crutch and boot (if still indicated) without increase in pain or significant gait deviations    Status Achieved    Target Date 06/18/20      PT SHORT TERM GOAL #4   Title negotiate stairs Mod I in order to return to sleeping in bedroom    Baseline 12/22: doing at home, still feels unsafe, supervision in clinic    Status Partially Met    Target Date 06/18/20             PT Long Term  Goals - 07/23/20 1553      PT LONG TERM GOAL #1   Title independent with final HEP    Period Weeks    Status On-going    Target Date 09/17/20      PT LONG TERM GOAL #2   Title demonstrate Lt ankle ROM to WNL for improved function    Baseline see flowsheets    Period Weeks    Status On-going    Target Date 09/17/20      PT LONG TERM GOAL #3   Title demonstrate 3/5 Lt ankle strength for improved function    Baseline see flowsheets    Period Weeks    Status On-going      PT LONG TERM GOAL #4   Title amb without AD independently for improved function    Status Achieved    Target Date 09/17/20      PT LONG TERM GOAL #5   Title FOTO score improved to 58    Baseline 1/24: 48    Status On-going    Target Date 09/17/20                 Plan - 07/23/20 1554    Clinical Impression Statement Pt has demonstrated progress towards all goals at this time, and still demonstrating limitations in ROM and strength.  FOTO score improved 28% at this time.  Will continue to benefit from PT to maximize function.    Personal Factors and Comorbidities Comorbidity 1    Comorbidities arthritis    Examination-Activity Limitations Bathing;Squat;Stairs;Stand;Transfers;Locomotion Level;Lift    Stability/Clinical Decision Making Evolving/Moderate complexity    Rehab Potential Good    PT Frequency 2x / week    PT Duration 8 weeks    PT Treatment/Interventions ADLs/Self Care Home Management;Cryotherapy;Electrical Stimulation;Iontophoresis 6m/ml Dexamethasone;Moist Heat;Ultrasound;Therapeutic exercise;Therapeutic activities;Patient/family education;Manual techniques;Dry needling;Taping;Vasopneumatic Device;Balance training;Functional mobility training;Stair training;Gait training;DME Instruction;Neuromuscular re-education;Passive range of motion;Scar mobilization    PT Next Visit Plan continue gastroc strengthening, Lt ankle ROM/strength    PT Home Exercise Plan Access Code: [email or web address]  Consulted  and Agree with Plan of Care Patient           Patient will benefit from skilled therapeutic intervention in order to improve the following deficits and impairments:  Decreased range of motion,Abnormal gait,Decreased endurance,Increased edema,Decreased scar mobility,Decreased knowledge of precautions,Decreased knowledge of use of DME,Decreased activity tolerance,Decreased mobility,Decreased balance,Difficulty walking,Decreased strength  Visit Diagnosis: Difficulty in walking, not elsewhere classified - Plan: PT plan of care cert/re-cert  Muscle weakness (generalized) - Plan: PT plan of care cert/re-cert  Stiffness of left ankle, not elsewhere classified - Plan: PT plan of care cert/re-cert  Pain in left ankle and joints of left foot - Plan: PT plan of care cert/re-cert  Localized edema - Plan: PT plan of care cert/re-cert  Other abnormalities of gait and mobility - Plan: PT plan of care cert/re-cert  Repeated falls - Plan: PT plan of care cert/re-cert     Problem List Patient Active Problem List   Diagnosis Date Noted  . Lisfranc dislocation, left, sequela   . Ankle syndesmosis disruption, left, sequela   . Generalized anxiety disorder 11/03/2017  . Neuropathy 11/04/2016  . Paresthesias 11/04/2016     Laureen Abrahams, PT, DPT 07/23/20 4:01 PM    [facility] [address] [place], [place], [address] Phone: [phone]   Fax:  [phone]  Name: Ann Sampson MRN: [id number] Date of Birth: 1976-02-11

## 2020-07-25 ENCOUNTER — Encounter: Payer: Self-pay | Admitting: Physical Therapy

## 2020-07-25 ENCOUNTER — Encounter: Payer: 59 | Admitting: Physical Therapy

## 2020-07-25 ENCOUNTER — Other Ambulatory Visit: Payer: Self-pay

## 2020-07-25 ENCOUNTER — Ambulatory Visit: Payer: 59 | Admitting: Physical Therapy

## 2020-07-25 DIAGNOSIS — M25572 Pain in left ankle and joints of left foot: Secondary | ICD-10-CM | POA: Diagnosis not present

## 2020-07-25 DIAGNOSIS — R600 Localized edema: Secondary | ICD-10-CM

## 2020-07-25 DIAGNOSIS — R262 Difficulty in walking, not elsewhere classified: Secondary | ICD-10-CM | POA: Diagnosis not present

## 2020-07-25 DIAGNOSIS — M6281 Muscle weakness (generalized): Secondary | ICD-10-CM

## 2020-07-25 DIAGNOSIS — R296 Repeated falls: Secondary | ICD-10-CM

## 2020-07-25 DIAGNOSIS — R2689 Other abnormalities of gait and mobility: Secondary | ICD-10-CM

## 2020-07-25 DIAGNOSIS — M25672 Stiffness of left ankle, not elsewhere classified: Secondary | ICD-10-CM

## 2020-07-25 NOTE — Therapy (Signed)
[facility] [address] [place], [place], [address] Phone: [phone]   Fax:  [phone]  Physical Therapy Treatment  Patient Details  Name: Ann Sampson MRN: [id number] Date of Birth: [birth year] Referring Provider (PT): Persons, Bevely Palmer, [place]   Encounter Date: 07/25/2020   PT End of Session - 07/25/20 1520    Visit Number 17    Number of Visits 32    Date for PT Re-Evaluation 07/16/20    Authorization Type [employer] OTHER    PT Start Time 1435    PT Stop Time 8413    PT Time Calculation (min) 42 min    Activity Tolerance No increased pain;Patient tolerated treatment well    Behavior During Therapy [facility] for tasks assessed/performed           Past Medical History:  Diagnosis Date  . Anxiety   . Arthritis   . Asthma   . Complication of anesthesia    with endoscopic spoke profeaty  . Depression   . Family history of adverse reaction to anesthesia    sister- vommitting  . Headache    magraines in the past  . Neuropathy    feet mainly- hands some  . Seasonal allergies     Past Surgical History:  Procedure Laterality Date  . BREAST SURGERY Bilateral    Breast Enhancement  . ESOPHAGOGASTRODUODENOSCOPY     x 3  last one approx 2002  . KNEE SURGERY Right    arthroscopy- cartilage  . ORIF ANKLE FRACTURE Left 04/18/2020   Procedure: OPEN REDUCTION INTERNAL FIXATION (ORIF) SYNDESMOSIS LEFT ANKLE AND LISFRANC JOINT LEFT FOOT;  Surgeon: Newt Minion, MD;  Location: [place];  Service: Orthopedics;  Laterality: Left;    There were no vitals filed for this visit.   Subjective Assessment - 07/25/20 1432    Subjective cold weather has increased pain/soreness a little bit    Limitations Standing;Walking;House hold activities    Patient Stated Goals improve mobility, return to baseline    Currently in Pain? Yes    Pain Score 3     Pain Location Ankle    Pain Orientation Left    Pain Descriptors / Indicators Aching;Sharp     Pain Type Chronic pain    Pain Onset More than a month ago    Pain Frequency Intermittent    Aggravating Factors  weight bearing    Pain Relieving Factors elevation, ice, rest                             [facility] Adult PT Treatment/Exercise - 07/25/20 1438      Ankle Exercises: Aerobic   Recumbent Bike L4 x 8 min      Ankle Exercises: Machines for Strengthening   Cybex Leg Press LLE jumping 31# x 20-30; LLE calf raises x 20 31#      Ankle Exercises: Standing   SLS LLE with 2# med ball - A-Z    Toe Raise 20 reps;3 seconds   slantboard   Other Standing Ankle Exercises SL deadlift 10# KB 2x10 bil; no UE support and Rt toe touch only      Ankle Exercises: Stretches   Slant Board Stretch 3 reps;30 seconds   gastroc and soleus                   PT Short Term Goals - 06/27/20 0919      PT SHORT TERM GOAL #  1   Title Pt wil be Ind in an initial HEP.    Status Achieved    Target Date 06/18/20      PT SHORT TERM GOAL #2   Title improve Lt ankle ROM by 8 degrees all motions for improved function    Status Achieved    Target Date 06/18/20      PT SHORT TERM GOAL #3   Title amb with single crutch and boot (if still indicated) without increase in pain or significant gait deviations    Status Achieved    Target Date 06/18/20      PT SHORT TERM GOAL #4   Title negotiate stairs Mod I in order to return to sleeping in bedroom    Baseline 12/22: doing at home, still feels unsafe, supervision in clinic    Status Partially Met    Target Date 06/18/20             PT Long Term Goals - 07/23/20 1553      PT LONG TERM GOAL #1   Title independent with final HEP    Period Weeks    Status On-going    Target Date 09/17/20      PT LONG TERM GOAL #2   Title demonstrate Lt ankle ROM to WNL for improved function    Baseline see flowsheets    Period Weeks    Status On-going    Target Date 09/17/20      PT LONG TERM GOAL #3   Title demonstrate 3/5 Lt  ankle strength for improved function    Baseline see flowsheets    Period Weeks    Status On-going      PT LONG TERM GOAL #4   Title amb without AD independently for improved function    Status Achieved    Target Date 09/17/20      PT LONG TERM GOAL #5   Title FOTO score improved to 58    Baseline 1/24: 48    Status On-going    Target Date 09/17/20                 Plan - 07/25/20 1521    Clinical Impression Statement Pt tolerated session well, with expected pain with SLS activities noted.  Continues to have difficulty with balance and strengthening of LLE.    Personal Factors and Comorbidities Comorbidity 1    Comorbidities arthritis    Examination-Activity Limitations Bathing;Squat;Stairs;Stand;Transfers;Locomotion Level;Lift    Stability/Clinical Decision Making Evolving/Moderate complexity    Rehab Potential Good    PT Frequency 2x / week    PT Duration 8 weeks    PT Treatment/Interventions ADLs/Self Care Home Management;Cryotherapy;Electrical Stimulation;Iontophoresis 62m/ml Dexamethasone;Moist Heat;Ultrasound;Therapeutic exercise;Therapeutic activities;Patient/family education;Manual techniques;Dry needling;Taping;Vasopneumatic Device;Balance training;Functional mobility training;Stair training;Gait training;DME Instruction;Neuromuscular re-education;Passive range of motion;Scar mobilization    PT Next Visit Plan continue gastroc strengthening, Lt ankle ROM/strength    PT Home Exercise Plan Access Code: [email or web address]   Consulted and Agree with Plan of Care Patient           Patient will benefit from skilled therapeutic intervention in order to improve the following deficits and impairments:  Decreased range of motion,Abnormal gait,Decreased endurance,Increased edema,Decreased scar mobility,Decreased knowledge of precautions,Decreased knowledge of use of DME,Decreased activity tolerance,Decreased mobility,Decreased balance,Difficulty walking,Decreased strength  Visit  Diagnosis: Difficulty in walking, not elsewhere classified  Muscle weakness (generalized)  Stiffness of left ankle, not elsewhere classified  Pain in left ankle and joints of left foot  Localized edema  Other  abnormalities of gait and mobility  Repeated falls     Problem List Patient Active Problem List   Diagnosis Date Noted  . Lisfranc dislocation, left, sequela   . Ankle syndesmosis disruption, left, sequela   . Generalized anxiety disorder 11/03/2017  . Neuropathy 11/04/2016  . Paresthesias 11/04/2016      Laureen Abrahams, PT, DPT 07/25/20 3:22 PM    [facility] [address] [place], [place], [address] Phone: [phone]   Fax:  [phone]  Name: Ann Sampson MRN: [id number] Date of Birth: [birth year]

## 2020-07-31 ENCOUNTER — Encounter: Payer: 59 | Admitting: Physical Therapy

## 2020-08-03 ENCOUNTER — Other Ambulatory Visit: Payer: Self-pay

## 2020-08-03 ENCOUNTER — Ambulatory Visit: Payer: 59 | Admitting: Physical Therapy

## 2020-08-03 ENCOUNTER — Encounter: Payer: Self-pay | Admitting: Physical Therapy

## 2020-08-03 DIAGNOSIS — M6281 Muscle weakness (generalized): Secondary | ICD-10-CM

## 2020-08-03 DIAGNOSIS — R262 Difficulty in walking, not elsewhere classified: Secondary | ICD-10-CM | POA: Diagnosis not present

## 2020-08-03 DIAGNOSIS — R600 Localized edema: Secondary | ICD-10-CM

## 2020-08-03 DIAGNOSIS — M25572 Pain in left ankle and joints of left foot: Secondary | ICD-10-CM | POA: Diagnosis not present

## 2020-08-03 DIAGNOSIS — M25672 Stiffness of left ankle, not elsewhere classified: Secondary | ICD-10-CM

## 2020-08-03 DIAGNOSIS — R2689 Other abnormalities of gait and mobility: Secondary | ICD-10-CM

## 2020-08-03 DIAGNOSIS — R296 Repeated falls: Secondary | ICD-10-CM

## 2020-08-03 NOTE — Therapy (Signed)
[facility] [address] [place], [place], [address] Phone: [phone]   Fax:  [phone]  Physical Therapy Treatment  Patient Details  Name: Ann Sampson MRN: [id number] Date of Birth: April 01, 1976 Referring Provider (PT): Persons, Bevely Palmer, [place]   Encounter Date: 08/03/2020   PT End of Session - 08/03/20 1025    Visit Number 18    Number of Visits 32    Date for PT Re-Evaluation 09/17/20   updated to reflect recent recert   Authorization Type [employer] OTHER    PT Start Time [phone]    PT Stop Time 1015    PT Time Calculation (min) 40 min    Activity Tolerance No increased pain;Patient tolerated treatment well    Behavior During Therapy [facility] for tasks assessed/performed           Past Medical History:  Diagnosis Date  . Anxiety   . Arthritis   . Asthma   . Complication of anesthesia    with endoscopic spoke profeaty  . Depression   . Family history of adverse reaction to anesthesia    sister- vommitting  . Headache    magraines in the past  . Neuropathy    feet mainly- hands some  . Seasonal allergies     Past Surgical History:  Procedure Laterality Date  . BREAST SURGERY Bilateral    Breast Enhancement  . ESOPHAGOGASTRODUODENOSCOPY     x 3  last one approx 2002  . KNEE SURGERY Right    arthroscopy- cartilage  . ORIF ANKLE FRACTURE Left 04/18/2020   Procedure: OPEN REDUCTION INTERNAL FIXATION (ORIF) SYNDESMOSIS LEFT ANKLE AND LISFRANC JOINT LEFT FOOT;  Surgeon: Newt Minion, MD;  Location: [place];  Service: Orthopedics;  Laterality: Left;    There were no vitals filed for this visit.   Subjective Assessment - 08/03/20 0933    Subjective ankle is doing well, no pain.  back to work seems to be okay    Limitations Standing;Walking;House hold activities    Patient Stated Goals improve mobility, return to baseline    Currently in Pain? No/denies                             [facility] Adult PT  Treatment/Exercise - 08/03/20 0938      Ankle Exercises: Aerobic   Recumbent Bike L4 x 8 min      Ankle Exercises: Machines for Strengthening   Cybex Leg Press LLE jumping 50# x 20-30; LLE calf raises x 20 31#      Ankle Exercises: Standing   SLS attempted on  balance disc to demonstrate for home option, including calf raises with min UE support    Toe Raise --   3x10; focus on using RLE     Ankle Exercises: Plyometrics   Plyometric Exercises forward/lateral hops 3x30 sec                    PT Short Term Goals - 06/27/20 0919      PT SHORT TERM GOAL #1   Title Pt wil be Ind in an initial HEP.    Status Achieved    Target Date 06/18/20      PT SHORT TERM GOAL #2   Title improve Lt ankle ROM by 8 degrees all motions for improved function    Status Achieved    Target Date 06/18/20      PT SHORT TERM GOAL #  3   Title amb with single crutch and boot (if still indicated) without increase in pain or significant gait deviations    Status Achieved    Target Date 06/18/20      PT SHORT TERM GOAL #4   Title negotiate stairs Mod I in order to return to sleeping in bedroom    Baseline 12/22: doing at home, still feels unsafe, supervision in clinic    Status Partially Met    Target Date 06/18/20             PT Long Term Goals - 07/23/20 1553      PT LONG TERM GOAL #1   Title independent with final HEP    Period Weeks    Status On-going    Target Date 09/17/20      PT LONG TERM GOAL #2   Title demonstrate Lt ankle ROM to WNL for improved function    Baseline see flowsheets    Period Weeks    Status On-going    Target Date 09/17/20      PT LONG TERM GOAL #3   Title demonstrate 3/5 Lt ankle strength for improved function    Baseline see flowsheets    Period Weeks    Status On-going      PT LONG TERM GOAL #4   Title amb without AD independently for improved function    Status Achieved    Target Date 09/17/20      PT LONG TERM GOAL #5   Title FOTO score  improved to 58    Baseline 1/24: 48    Status On-going    Target Date 09/17/20                 Plan - 08/03/20 1026    Clinical Impression Statement Continued focus on Lt ankle strengthening, especially plantarflexion.  Will continue to benefit from PT to maximize function.    Personal Factors and Comorbidities Comorbidity 1    Comorbidities arthritis    Examination-Activity Limitations Bathing;Squat;Stairs;Stand;Transfers;Locomotion Level;Lift    Stability/Clinical Decision Making Evolving/Moderate complexity    Rehab Potential Good    PT Frequency 2x / week    PT Duration 8 weeks    PT Treatment/Interventions ADLs/Self Care Home Management;Cryotherapy;Electrical Stimulation;Iontophoresis 42m/ml Dexamethasone;Moist Heat;Ultrasound;Therapeutic exercise;Therapeutic activities;Patient/family education;Manual techniques;Dry needling;Taping;Vasopneumatic Device;Balance training;Functional mobility training;Stair training;Gait training;DME Instruction;Neuromuscular re-education;Passive range of motion;Scar mobilization    PT Next Visit Plan continue gastroc strengthening, Lt ankle ROM/strength    PT Home Exercise Plan Access Code: [email or web address]   Consulted and Agree with Plan of Care Patient           Patient will benefit from skilled therapeutic intervention in order to improve the following deficits and impairments:  Decreased range of motion,Abnormal gait,Decreased endurance,Increased edema,Decreased scar mobility,Decreased knowledge of precautions,Decreased knowledge of use of DME,Decreased activity tolerance,Decreased mobility,Decreased balance,Difficulty walking,Decreased strength  Visit Diagnosis: Difficulty in walking, not elsewhere classified  Muscle weakness (generalized)  Stiffness of left ankle, not elsewhere classified  Pain in left ankle and joints of left foot  Localized edema  Other abnormalities of gait and mobility  Repeated falls     Problem List Patient  Active Problem List   Diagnosis Date Noted  . Lisfranc dislocation, left, sequela   . Ankle syndesmosis disruption, left, sequela   . Generalized anxiety disorder 11/03/2017  . Neuropathy 11/04/2016  . Paresthesias 11/04/2016     SLaureen Abrahams PT, DPT 08/03/20 10:30 AM    [facility]  [address] [place], [place], [address] Phone: [phone]   Fax:  [phone]  Name: Ann Sampson MRN: [id number] Date of Birth: [birth year]

## 2020-08-06 ENCOUNTER — Ambulatory Visit: Payer: 59 | Admitting: Physical Therapy

## 2020-08-06 ENCOUNTER — Other Ambulatory Visit: Payer: Self-pay

## 2020-08-06 ENCOUNTER — Encounter: Payer: Self-pay | Admitting: Physical Therapy

## 2020-08-06 DIAGNOSIS — M25672 Stiffness of left ankle, not elsewhere classified: Secondary | ICD-10-CM

## 2020-08-06 DIAGNOSIS — M25572 Pain in left ankle and joints of left foot: Secondary | ICD-10-CM | POA: Diagnosis not present

## 2020-08-06 DIAGNOSIS — M6281 Muscle weakness (generalized): Secondary | ICD-10-CM | POA: Diagnosis not present

## 2020-08-06 DIAGNOSIS — R262 Difficulty in walking, not elsewhere classified: Secondary | ICD-10-CM

## 2020-08-06 DIAGNOSIS — R600 Localized edema: Secondary | ICD-10-CM

## 2020-08-06 NOTE — Therapy (Signed)
[facility] Physical Therapy [address] [place], [place], [address] Phone: [phone]   Fax:  [phone]  Physical Therapy Treatment  Patient Details  Name: Ann Sampson MRN: [id number] Date of Birth: 04-20-1976 Referring Provider (PT): Persons, Bevely Palmer, [place]   Encounter Date: 08/06/2020   PT End of Session - 08/06/20 1525    Visit Number 19    Number of Visits 32    Date for PT Re-Evaluation 09/17/20    Authorization Type [employer] OTHER    PT Start Time 1518    PT Stop Time 1600    PT Time Calculation (min) 42 min    Activity Tolerance No increased pain;Patient tolerated treatment well    Behavior During Therapy [facility] for tasks assessed/performed           Past Medical History:  Diagnosis Date  . Anxiety   . Arthritis   . Asthma   . Complication of anesthesia    with endoscopic spoke profeaty  . Depression   . Family history of adverse reaction to anesthesia    sister- vommitting  . Headache    magraines in the past  . Neuropathy    feet mainly- hands some  . Seasonal allergies     Past Surgical History:  Procedure Laterality Date  . BREAST SURGERY Bilateral    Breast Enhancement  . ESOPHAGOGASTRODUODENOSCOPY     x 3  last one approx 2002  . KNEE SURGERY Right    arthroscopy- cartilage  . ORIF ANKLE FRACTURE Left 04/18/2020   Procedure: OPEN REDUCTION INTERNAL FIXATION (ORIF) SYNDESMOSIS LEFT ANKLE AND LISFRANC JOINT LEFT FOOT;  Surgeon: Newt Minion, MD;  Location: [place];  Service: Orthopedics;  Laterality: Left;    There were no vitals filed for this visit.   Subjective Assessment - 08/06/20 1525    Subjective Pt reporting her ankle is doing much better.    Limitations Standing;Walking;House hold activities    Patient Stated Goals improve mobility, return to baseline    Currently in Pain? No/denies                             [facility] Adult PT Treatment/Exercise - 08/06/20 0001      Ankle  Exercises: Machines for Strengthening   Cybex Leg Press LLE jumping 50# x 20-30; LLE calf raises x 20 31#      Ankle Exercises: Standing   Heel Walk (Round Trip) 1 lap around gym    Toe Walk (Round Trip) 1 lap around gym    Other Standing Ankle Exercises jumpting feet together and then apart in agility ladder x 4      Ankle Exercises: Plyometrics   Plyometric Exercises forward/lateral hops 3x30 sec      Ankle Exercises: Aerobic   Recumbent Bike L4 x 6 min                    PT Short Term Goals - 08/06/20 1526      PT SHORT TERM GOAL #1   Title Pt wil be Ind in an initial HEP.    Status Achieved      PT SHORT TERM GOAL #2   Title improve Lt ankle ROM by 8 degrees all motions for improved function    Status Achieved      PT SHORT TERM GOAL #3   Title amb with single crutch and boot (if still indicated) without increase in pain  or significant gait deviations    Status Achieved      PT SHORT TERM GOAL #4   Title negotiate stairs Mod I in order to return to sleeping in bedroom    Status Partially Met             PT Long Term Goals - 08/06/20 1526      PT LONG TERM GOAL #1   Title independent with final HEP    Baseline 105, 93, 25, 45 respectively    Status On-going      PT LONG TERM GOAL #2   Title demonstrate Lt ankle ROM to WNL for improved function    Status On-going      PT LONG TERM GOAL #3   Title demonstrate 3/5 Lt ankle strength for improved function    Status On-going      PT LONG TERM GOAL #4   Title amb without AD independently for improved function    Status On-going      PT LONG TERM GOAL #5   Title FOTO score improved to 58    Status On-going                 Plan - 08/06/20 1619    Clinical Impression Statement Pt focusing on Left ankle strengthening and single leg activities. Progressing with plyometrics as pt tolerates in order to maximize functional mobility. Continue skilled PT.    Personal Factors and Comorbidities  Comorbidity 1    Comorbidities arthritis    Examination-Activity Limitations Bathing;Squat;Stairs;Stand;Transfers;Locomotion Level;Lift    Stability/Clinical Decision Making Evolving/Moderate complexity    Rehab Potential Good    PT Duration 8 weeks    PT Treatment/Interventions ADLs/Self Care Home Management;Cryotherapy;Electrical Stimulation;Iontophoresis 68m/ml Dexamethasone;Moist Heat;Ultrasound;Therapeutic exercise;Therapeutic activities;Patient/family education;Manual techniques;Dry needling;Taping;Vasopneumatic Device;Balance training;Functional mobility training;Stair training;Gait training;DME Instruction;Neuromuscular re-education;Passive range of motion;Scar mobilization    PT Next Visit Plan continue gastroc strengthening, Lt ankle ROM/strength    PT Home Exercise Plan Access Code: [email or web address]   Consulted and Agree with Plan of Care Patient           Patient will benefit from skilled therapeutic intervention in order to improve the following deficits and impairments:  Decreased range of motion,Abnormal gait,Decreased endurance,Increased edema,Decreased scar mobility,Decreased knowledge of precautions,Decreased knowledge of use of DME,Decreased activity tolerance,Decreased mobility,Decreased balance,Difficulty walking,Decreased strength  Visit Diagnosis: Difficulty in walking, not elsewhere classified  Muscle weakness (generalized)  Stiffness of left ankle, not elsewhere classified  Pain in left ankle and joints of left foot  Localized edema     Problem List Patient Active Problem List   Diagnosis Date Noted  . Lisfranc dislocation, left, sequela   . Ankle syndesmosis disruption, left, sequela   . Generalized anxiety disorder 11/03/2017  . Neuropathy 11/04/2016  . Paresthesias 11/04/2016    JOretha Caprice PT, MPT 08/06/2020, 4:20 PM  [facility] [address] [address]Phone: [phone]  Fax:   [phone] Name: Ann EDGLEYMRN: [id number] of Birth: [birth year]

## 2020-08-07 ENCOUNTER — Ambulatory Visit: Payer: 59 | Admitting: Physical Therapy

## 2020-08-07 ENCOUNTER — Encounter: Payer: Self-pay | Admitting: Physical Therapy

## 2020-08-07 DIAGNOSIS — M7501 Adhesive capsulitis of right shoulder: Secondary | ICD-10-CM

## 2020-08-07 DIAGNOSIS — R296 Repeated falls: Secondary | ICD-10-CM

## 2020-08-07 DIAGNOSIS — M6281 Muscle weakness (generalized): Secondary | ICD-10-CM

## 2020-08-07 DIAGNOSIS — R262 Difficulty in walking, not elsewhere classified: Secondary | ICD-10-CM

## 2020-08-07 DIAGNOSIS — M25572 Pain in left ankle and joints of left foot: Secondary | ICD-10-CM

## 2020-08-07 DIAGNOSIS — R2689 Other abnormalities of gait and mobility: Secondary | ICD-10-CM

## 2020-08-07 DIAGNOSIS — R600 Localized edema: Secondary | ICD-10-CM

## 2020-08-07 DIAGNOSIS — M25672 Stiffness of left ankle, not elsewhere classified: Secondary | ICD-10-CM

## 2020-08-07 DIAGNOSIS — M25611 Stiffness of right shoulder, not elsewhere classified: Secondary | ICD-10-CM

## 2020-08-07 DIAGNOSIS — M25511 Pain in right shoulder: Secondary | ICD-10-CM

## 2020-08-07 NOTE — Therapy (Signed)
[facility] [address] [place], [place], [address] Phone: [phone]   Fax:  [phone]  Physical Therapy Treatment  Patient Details  Name: Ann Sampson MRN: [id number] Date of Birth: 13-Mar-1976 Referring Provider (PT): Persons, Bevely Palmer, [place]   Encounter Date: 08/07/2020   PT End of Session - 08/07/20 1638    Visit Number 20    Number of Visits 32    Date for PT Re-Evaluation 09/17/20    Authorization Type [employer] OTHER    PT Start Time 1523    PT Stop Time 1613    PT Time Calculation (min) 50 min    Activity Tolerance No increased pain;Patient tolerated treatment well    Behavior During Therapy [facility] for tasks assessed/performed           Past Medical History:  Diagnosis Date  . Anxiety   . Arthritis   . Asthma   . Complication of anesthesia    with endoscopic spoke profeaty  . Depression   . Family history of adverse reaction to anesthesia    sister- vommitting  . Headache    magraines in the past  . Neuropathy    feet mainly- hands some  . Seasonal allergies     Past Surgical History:  Procedure Laterality Date  . BREAST SURGERY Bilateral    Breast Enhancement  . ESOPHAGOGASTRODUODENOSCOPY     x 3  last one approx 2002  . KNEE SURGERY Right    arthroscopy- cartilage  . ORIF ANKLE FRACTURE Left 04/18/2020   Procedure: OPEN REDUCTION INTERNAL FIXATION (ORIF) SYNDESMOSIS LEFT ANKLE AND LISFRANC JOINT LEFT FOOT;  Surgeon: Newt Minion, MD;  Location: [place];  Service: Orthopedics;  Laterality: Left;    There were no vitals filed for this visit.   Subjective Assessment - 08/07/20 1637    Subjective Pt arriving reporting no pain, still with antalgic gait.    Limitations Standing;Walking;House hold activities    Patient Stated Goals improve mobility, return to baseline    Currently in Pain? No/denies                             [facility] Adult PT Treatment/Exercise - 08/07/20 0001       Vasopneumatic   Number Minutes Vasopneumatic  10 minutes    Vasopnuematic Location  Ankle    Vasopneumatic Pressure Medium    Vasopneumatic Temperature  34      Ankle Exercises: Aerobic   Recumbent Bike L4 x 8 min      Ankle Exercises: Machines for Strengthening   Cybex Leg Press LLE jumping 50# x 20-30; LLE calf raises x 20 31#      Ankle Exercises: Plyometrics   Bilateral Jumping 10 reps    Plyometric Exercises forward/lateral hops 3x30 sec    Plyometric Exercises agility ladder, skipping every other block with side stepping hops, bilateral feet together and apart      Ankle Exercises: Standing   SLS on Airex x 4 intermittent UE support as needed each attempt 30 seconds                    PT Short Term Goals - 08/06/20 1526      PT SHORT TERM GOAL #1   Title Pt wil be Ind in an initial HEP.    Status Achieved      PT SHORT TERM GOAL #2   Title improve Lt ankle ROM  by 8 degrees all motions for improved function    Status Achieved      PT SHORT TERM GOAL #3   Title amb with single crutch and boot (if still indicated) without increase in pain or significant gait deviations    Status Achieved      PT SHORT TERM GOAL #4   Title negotiate stairs Mod I in order to return to sleeping in bedroom    Status Partially Met             PT Long Term Goals - 08/06/20 1526      PT LONG TERM GOAL #1   Title independent with final HEP    Baseline 105, 93, 25, 45 respectively    Status On-going      PT LONG TERM GOAL #2   Title demonstrate Lt ankle ROM to WNL for improved function    Status On-going      PT LONG TERM GOAL #3   Title demonstrate 3/5 Lt ankle strength for improved function    Status On-going      PT LONG TERM GOAL #4   Title amb without AD independently for improved function    Status On-going      PT LONG TERM GOAL #5   Title FOTO score improved to 58    Status On-going                 Plan - 08/07/20 1638    Clinical Impression  Statement Pt tolerating exercises well wtih focus on strengtening and SLS activities. Adding more plyometrics and single leg tolerance building calf strength and functional mobility. Pt still having diffculty with single leg hop with increased pain and weakness noted. Continue skilled PT.    Personal Factors and Comorbidities Comorbidity 1    Comorbidities arthritis    Examination-Activity Limitations Bathing;Squat;Stairs;Stand;Transfers;Locomotion Level;Lift    Stability/Clinical Decision Making Evolving/Moderate complexity    Rehab Potential Good    PT Frequency 2x / week    PT Duration 8 weeks    PT Treatment/Interventions ADLs/Self Care Home Management;Cryotherapy;Electrical Stimulation;Iontophoresis 4mg /ml Dexamethasone;Moist Heat;Ultrasound;Therapeutic exercise;Therapeutic activities;Patient/family education;Manual techniques;Dry needling;Taping;Vasopneumatic Device;Balance training;Functional mobility training;Stair training;Gait training;DME Instruction;Neuromuscular re-education;Passive range of motion;Scar mobilization    PT Next Visit Plan continue gastroc strengthening, Lt ankle ROM/strength    PT Home Exercise Plan Access Code: [email or web address]    Consulted and Agree with Plan of Care Patient           Patient will benefit from skilled therapeutic intervention in order to improve the following deficits and impairments:  Decreased range of motion,Abnormal gait,Decreased endurance,Increased edema,Decreased scar mobility,Decreased knowledge of precautions,Decreased knowledge of use of DME,Decreased activity tolerance,Decreased mobility,Decreased balance,Difficulty walking,Decreased strength  Visit Diagnosis: Difficulty in walking, not elsewhere classified  Muscle weakness (generalized)  Stiffness of left ankle, not elsewhere classified  Pain in left ankle and joints of left foot  Localized edema  Other abnormalities of gait and mobility  Repeated falls  Pain in joint of right  shoulder  Adhesive capsulitis of right shoulder  Decreased ROM of right shoulder     Problem List Patient Active Problem List   Diagnosis Date Noted  . Lisfranc dislocation, left, sequela   . Ankle syndesmosis disruption, left, sequela   . Generalized anxiety disorder 11/03/2017  . Neuropathy 11/04/2016  . Paresthesias 11/04/2016    Oretha Caprice, PT, MPT 08/07/2020, 4:41 PM  [facility] [address] [place], [place], [address] Phone: [phone]   Fax:  [phone]  Name: MITTIE KNITTEL MRN: [id number] Date of Birth: Apr 19, 1976

## 2020-08-13 ENCOUNTER — Other Ambulatory Visit: Payer: Self-pay

## 2020-08-13 ENCOUNTER — Ambulatory Visit: Payer: 59 | Admitting: Physical Therapy

## 2020-08-13 ENCOUNTER — Encounter: Payer: Self-pay | Admitting: Physical Therapy

## 2020-08-13 DIAGNOSIS — R262 Difficulty in walking, not elsewhere classified: Secondary | ICD-10-CM

## 2020-08-13 DIAGNOSIS — M6281 Muscle weakness (generalized): Secondary | ICD-10-CM

## 2020-08-13 DIAGNOSIS — R600 Localized edema: Secondary | ICD-10-CM

## 2020-08-13 DIAGNOSIS — R296 Repeated falls: Secondary | ICD-10-CM

## 2020-08-13 DIAGNOSIS — M25572 Pain in left ankle and joints of left foot: Secondary | ICD-10-CM

## 2020-08-13 DIAGNOSIS — R2689 Other abnormalities of gait and mobility: Secondary | ICD-10-CM

## 2020-08-13 DIAGNOSIS — M25672 Stiffness of left ankle, not elsewhere classified: Secondary | ICD-10-CM

## 2020-08-13 NOTE — Therapy (Signed)
[facility] [address] [place], [place], [address] Phone: [phone]   Fax:  [phone]  Physical Therapy Treatment  Patient Details  Name: Ann Sampson MRN: [id number] Date of Birth: 03-12-1976 Referring Provider (PT): Persons, Bevely Palmer, [place]   Encounter Date: 08/13/2020   PT End of Session - 08/13/20 1622    Visit Number 21    Number of Visits 32    Date for PT Re-Evaluation 09/17/20    Authorization Type [employer] OTHER    PT Start Time 1525    PT Stop Time 1610    PT Time Calculation (min) 45 min    Activity Tolerance No increased pain;Patient tolerated treatment well    Behavior During Therapy [facility] for tasks assessed/performed           Past Medical History:  Diagnosis Date  . Anxiety   . Arthritis   . Asthma   . Complication of anesthesia    with endoscopic spoke profeaty  . Depression   . Family history of adverse reaction to anesthesia    sister- vommitting  . Headache    magraines in the past  . Neuropathy    feet mainly- hands some  . Seasonal allergies     Past Surgical History:  Procedure Laterality Date  . BREAST SURGERY Bilateral    Breast Enhancement  . ESOPHAGOGASTRODUODENOSCOPY     x 3  last one approx 2002  . KNEE SURGERY Right    arthroscopy- cartilage  . ORIF ANKLE FRACTURE Left 04/18/2020   Procedure: OPEN REDUCTION INTERNAL FIXATION (ORIF) SYNDESMOSIS LEFT ANKLE AND LISFRANC JOINT LEFT FOOT;  Surgeon: Newt Minion, MD;  Location: [place];  Service: Orthopedics;  Laterality: Left;    There were no vitals filed for this visit.   Subjective Assessment - 08/13/20 1621    Subjective Pt arriving today reporting some soreness in L ankle.    Limitations Standing;Walking;House hold activities    Patient Stated Goals improve mobility, return to baseline    Currently in Pain? Yes    Pain Score 2     Pain Location Ankle    Pain Orientation Left    Pain Descriptors / Indicators Sore     Pain Onset More than a month ago    Pain Frequency Intermittent                             [facility] Adult PT Treatment/Exercise - 08/13/20 0001      Ankle Exercises: Standing   SLS Airex: 30 second x 4 using intermittent UE support as needed    Balance Beam x 6 walking foward and back intermittent UE support    Side Shuffle (Round Trip) 20 feet x 4    Other Standing Ankle Exercises BOSU ball bilateral LE's: 3 minutes with UE support as needed, L LE single leg calf raises x 15 with single UE support as needed.      Ankle Exercises: Aerobic   Recumbent Bike L4 x 6 minutes                    PT Short Term Goals - 08/06/20 1526      PT SHORT TERM GOAL #1   Title Pt wil be Ind in an initial HEP.    Status Achieved      PT SHORT TERM GOAL #2   Title improve Lt ankle ROM by 8 degrees all motions  for improved function    Status Achieved      PT SHORT TERM GOAL #3   Title amb with single crutch and boot (if still indicated) without increase in pain or significant gait deviations    Status Achieved      PT SHORT TERM GOAL #4   Title negotiate stairs Mod I in order to return to sleeping in bedroom    Status Partially Met             PT Long Term Goals - 08/13/20 1624      PT LONG TERM GOAL #1   Title independent with final HEP    Status On-going      PT LONG TERM GOAL #2   Title demonstrate Lt ankle ROM to WNL for improved function    Status On-going      PT LONG TERM GOAL #3   Title demonstrate 3/5 Lt ankle strength for improved function    Status On-going      PT LONG TERM GOAL #4   Title amb without AD independently for improved function    Status On-going      PT LONG TERM GOAL #5   Title FOTO score improved to 58    Status On-going                 Plan - 08/13/20 1622    Clinical Impression Statement Pt tolerating exericses well with focusing on strengthening and SLS activities. Continuing to add plyometrics and L LE  strengthening for improved functional mobility. Pt still requiring intermittent UE support for balance on her left LE. Continue to progress toward LTG's.    Personal Factors and Comorbidities Comorbidity 1    Comorbidities arthritis    Examination-Activity Limitations Bathing;Squat;Stairs;Stand;Transfers;Locomotion Level;Lift    Stability/Clinical Decision Making Evolving/Moderate complexity    Rehab Potential Good    PT Frequency 2x / week    PT Duration 8 weeks    PT Treatment/Interventions ADLs/Self Care Home Management;Cryotherapy;Electrical Stimulation;Iontophoresis 61m/ml Dexamethasone;Moist Heat;Ultrasound;Therapeutic exercise;Therapeutic activities;Patient/family education;Manual techniques;Dry needling;Taping;Vasopneumatic Device;Balance training;Functional mobility training;Stair training;Gait training;DME Instruction;Neuromuscular re-education;Passive range of motion;Scar mobilization    PT Next Visit Plan continue gastroc strengthening, Lt ankle ROM/strength    PT Home Exercise Plan Access Code: [email or web address]   Consulted and Agree with Plan of Care Patient           Patient will benefit from skilled therapeutic intervention in order to improve the following deficits and impairments:  Decreased range of motion,Abnormal gait,Decreased endurance,Increased edema,Decreased scar mobility,Decreased knowledge of precautions,Decreased knowledge of use of DME,Decreased activity tolerance,Decreased mobility,Decreased balance,Difficulty walking,Decreased strength  Visit Diagnosis: Difficulty in walking, not elsewhere classified  Muscle weakness (generalized)  Stiffness of left ankle, not elsewhere classified  Pain in left ankle and joints of left foot  Localized edema  Other abnormalities of gait and mobility  Repeated falls     Problem List Patient Active Problem List   Diagnosis Date Noted  . Lisfranc dislocation, left, sequela   . Ankle syndesmosis disruption, left, sequela    . Generalized anxiety disorder 11/03/2017  . Neuropathy 11/04/2016  . Paresthesias 11/04/2016    JOretha Caprice, PT, MPT 08/13/2020, 4:26 PM  [facility] [address] [place] [address]Phone: [phone]  Fax:  [phone] Name: RBREEZIE MICUCCIMRN: [id number] of Birth: 101/09/1975

## 2020-08-15 ENCOUNTER — Ambulatory Visit: Payer: 59 | Admitting: Physical Therapy

## 2020-08-15 ENCOUNTER — Other Ambulatory Visit: Payer: Self-pay

## 2020-08-15 ENCOUNTER — Encounter: Payer: Self-pay | Admitting: Physical Therapy

## 2020-08-15 DIAGNOSIS — M25572 Pain in left ankle and joints of left foot: Secondary | ICD-10-CM | POA: Diagnosis not present

## 2020-08-15 DIAGNOSIS — R262 Difficulty in walking, not elsewhere classified: Secondary | ICD-10-CM

## 2020-08-15 DIAGNOSIS — M25672 Stiffness of left ankle, not elsewhere classified: Secondary | ICD-10-CM | POA: Diagnosis not present

## 2020-08-15 DIAGNOSIS — R600 Localized edema: Secondary | ICD-10-CM

## 2020-08-15 DIAGNOSIS — M6281 Muscle weakness (generalized): Secondary | ICD-10-CM | POA: Diagnosis not present

## 2020-08-15 DIAGNOSIS — R296 Repeated falls: Secondary | ICD-10-CM

## 2020-08-15 DIAGNOSIS — R2689 Other abnormalities of gait and mobility: Secondary | ICD-10-CM

## 2020-08-15 NOTE — Therapy (Signed)
[facility] Therapy [address] [place], [place], [address] Phone: [phone]   Fax:  [phone]  Physical Therapy Treatment  Patient Details  Name: Ann Sampson MRN: [id number] Date of Birth: [birth year] Referring Provider (PT): Persons, Bevely Palmer, [place]   Encounter Date: 08/15/2020   PT End of Session - 08/15/20 1556    Visit Number 22    Number of Visits 32    Date for PT Re-Evaluation 09/17/20    Authorization Type [employer] OTHER    PT Start Time 5188    PT Stop Time 1556    PT Time Calculation (min) 41 min    Activity Tolerance No increased pain;Patient tolerated treatment well    Behavior During Therapy [facility] for tasks assessed/performed           Past Medical History:  Diagnosis Date  . Anxiety   . Arthritis   . Asthma   . Complication of anesthesia    with endoscopic spoke profeaty  . Depression   . Family history of adverse reaction to anesthesia    sister- vommitting  . Headache    magraines in the past  . Neuropathy    feet mainly- hands some  . Seasonal allergies     Past Surgical History:  Procedure Laterality Date  . BREAST SURGERY Bilateral    Breast Enhancement  . ESOPHAGOGASTRODUODENOSCOPY     x 3  last one approx 2002  . KNEE SURGERY Right    arthroscopy- cartilage  . ORIF ANKLE FRACTURE Left 04/18/2020   Procedure: OPEN REDUCTION INTERNAL FIXATION (ORIF) SYNDESMOSIS LEFT ANKLE AND LISFRANC JOINT LEFT FOOT;  Surgeon: Newt Minion, MD;  Location: [place];  Service: Orthopedics;  Laterality: Left;    There were no vitals filed for this visit.   Subjective Assessment - 08/15/20 1517    Subjective Lt ankle is feeling better; sharp pains are improving in frequency.  Feels more flexible and able to do stairs reciprocally.    Limitations Standing;Walking;House hold activities    Patient Stated Goals improve mobility, return to baseline    Currently in Pain? No/denies                              [facility] Adult PT Treatment/Exercise - 08/15/20 1521      Ankle Exercises: Aerobic   Recumbent Bike L5 x 8 min      Ankle Exercises: Standing   SLS Airex and Solid Surface: 30 second x 4 using intermittent UE support as needed    Other Standing Ankle Exercises lateral tap down LLE on step 3x10; Lt foot on chair with stretch 10x10 sec hold                    PT Short Term Goals - 08/06/20 1526      PT SHORT TERM GOAL #1   Title Pt wil be Ind in an initial HEP.    Status Achieved      PT SHORT TERM GOAL #2   Title improve Lt ankle ROM by 8 degrees all motions for improved function    Status Achieved      PT SHORT TERM GOAL #3   Title amb with single crutch and boot (if still indicated) without increase in pain or significant gait deviations    Status Achieved      PT SHORT TERM GOAL #4   Title negotiate stairs Mod I in order  to return to sleeping in bedroom    Status Partially Met             PT Long Term Goals - 08/13/20 1624      PT LONG TERM GOAL #1   Title independent with final HEP    Status On-going      PT LONG TERM GOAL #2   Title demonstrate Lt ankle ROM to WNL for improved function    Status On-going      PT LONG TERM GOAL #3   Title demonstrate 3/5 Lt ankle strength for improved function    Status On-going      PT LONG TERM GOAL #4   Title amb without AD independently for improved function    Status On-going      PT LONG TERM GOAL #5   Title FOTO score improved to 58    Status On-going                 Plan - 08/15/20 1556    Clinical Impression Statement Progressing well with PT reporting decreased pain and improved function. Still has some weakness and balance deficits and will benefit from PT to maximize function.    Personal Factors and Comorbidities Comorbidity 1    Comorbidities arthritis    Examination-Activity Limitations Bathing;Squat;Stairs;Stand;Transfers;Locomotion Level;Lift     Stability/Clinical Decision Making Evolving/Moderate complexity    Rehab Potential Good    PT Frequency 2x / week    PT Duration 8 weeks    PT Treatment/Interventions ADLs/Self Care Home Management;Cryotherapy;Electrical Stimulation;Iontophoresis 78m/ml Dexamethasone;Moist Heat;Ultrasound;Therapeutic exercise;Therapeutic activities;Patient/family education;Manual techniques;Dry needling;Taping;Vasopneumatic Device;Balance training;Functional mobility training;Stair training;Gait training;DME Instruction;Neuromuscular re-education;Passive range of motion;Scar mobilization    PT Next Visit Plan continue gastroc strengthening, Lt ankle ROM/strength    PT Home Exercise Plan Access Code: [email or web address]   Consulted and Agree with Plan of Care Patient           Patient will benefit from skilled therapeutic intervention in order to improve the following deficits and impairments:  Decreased range of motion,Abnormal gait,Decreased endurance,Increased edema,Decreased scar mobility,Decreased knowledge of precautions,Decreased knowledge of use of DME,Decreased activity tolerance,Decreased mobility,Decreased balance,Difficulty walking,Decreased strength  Visit Diagnosis: Difficulty in walking, not elsewhere classified  Muscle weakness (generalized)  Stiffness of left ankle, not elsewhere classified  Pain in left ankle and joints of left foot  Localized edema  Other abnormalities of gait and mobility  Repeated falls     Problem List Patient Active Problem List   Diagnosis Date Noted  . Lisfranc dislocation, left, sequela   . Ankle syndesmosis disruption, left, sequela   . Generalized anxiety disorder 11/03/2017  . Neuropathy 11/04/2016  . Paresthesias 11/04/2016      SLaureen Abrahams PT, DPT 08/15/20 3:58 PM    [facility] [address] [place] [address]Phone: [phone]  Fax:  [phone] Name: RTAMYAH CUTBIRTHMRN:  [id number] of Birth: [birth year]

## 2020-08-20 ENCOUNTER — Encounter: Payer: 59 | Admitting: Physical Therapy

## 2020-08-22 ENCOUNTER — Encounter: Payer: Self-pay | Admitting: Physical Therapy

## 2020-08-22 ENCOUNTER — Ambulatory Visit: Payer: 59 | Admitting: Physical Therapy

## 2020-08-22 DIAGNOSIS — M25672 Stiffness of left ankle, not elsewhere classified: Secondary | ICD-10-CM | POA: Diagnosis not present

## 2020-08-22 DIAGNOSIS — M6281 Muscle weakness (generalized): Secondary | ICD-10-CM

## 2020-08-22 DIAGNOSIS — R262 Difficulty in walking, not elsewhere classified: Secondary | ICD-10-CM

## 2020-08-22 DIAGNOSIS — R600 Localized edema: Secondary | ICD-10-CM

## 2020-08-22 DIAGNOSIS — M25572 Pain in left ankle and joints of left foot: Secondary | ICD-10-CM | POA: Diagnosis not present

## 2020-08-22 DIAGNOSIS — R2689 Other abnormalities of gait and mobility: Secondary | ICD-10-CM

## 2020-08-22 DIAGNOSIS — R296 Repeated falls: Secondary | ICD-10-CM

## 2020-08-22 NOTE — Therapy (Signed)
[facility] Physical Therapy [address] [place], [place], [address] Phone: [phone]   Fax:  [phone]  Physical Therapy Treatment  Patient Details  Name: Ann Sampson MRN: [id number] Date of Birth: October 16, 1975 Referring Provider (PT): Persons, Bevely Palmer, [place]   Encounter Date: 08/22/2020   PT End of Session - 08/22/20 1342    Visit Number 23    Number of Visits 32    Date for PT Re-Evaluation 09/17/20    Authorization Type [employer] OTHER    PT Start Time 1300    PT Stop Time 1342    PT Time Calculation (min) 42 min    Activity Tolerance No increased pain;Patient tolerated treatment well    Behavior During Therapy [facility] for tasks assessed/performed           Past Medical History:  Diagnosis Date  . Anxiety   . Arthritis   . Asthma   . Complication of anesthesia    with endoscopic spoke profeaty  . Depression   . Family history of adverse reaction to anesthesia    sister- vommitting  . Headache    magraines in the past  . Neuropathy    feet mainly- hands some  . Seasonal allergies     Past Surgical History:  Procedure Laterality Date  . BREAST SURGERY Bilateral    Breast Enhancement  . ESOPHAGOGASTRODUODENOSCOPY     x 3  last one approx 2002  . KNEE SURGERY Right    arthroscopy- cartilage  . ORIF ANKLE FRACTURE Left 04/18/2020   Procedure: OPEN REDUCTION INTERNAL FIXATION (ORIF) SYNDESMOSIS LEFT ANKLE AND LISFRANC JOINT LEFT FOOT;  Surgeon: Newt Minion, MD;  Location: [place];  Service: Orthopedics;  Laterality: Left;    There were no vitals filed for this visit.   Subjective Assessment - 08/22/20 1304    Subjective ankle is doing well; sharp pains are subsididng    Limitations Standing;Walking;House hold activities    Patient Stated Goals improve mobility, return to baseline    Currently in Pain? No/denies                             [facility] Adult PT Treatment/Exercise - 08/22/20 0001      Ankle  Exercises: Stretches   Soleus Stretch 3 reps;30 seconds   slantboard   Gastroc Stretch 3 reps;30 seconds   slantboard     Ankle Exercises: Aerobic   Tread Mill incline 4%; 2.0 mph x 6 min      Ankle Exercises: Standing   SLS LLE with ball toss x 20 reps; intermittent RLE tap down    Heel Raises Both;20 reps   off slantboard   Other Standing Ankle Exercises forward/lateral hopping 2x30 sec      Ankle Exercises: Plyometrics   Bilateral Jumping 1 set;10 reps;Box Height: 2";5 reps;Box Height: 4"      Ankle Exercises: Machines for Strengthening   Cybex Leg Press 100# 3x10                    PT Short Term Goals - 08/06/20 1526      PT SHORT TERM GOAL #1   Title Pt wil be Ind in an initial HEP.    Status Achieved      PT SHORT TERM GOAL #2   Title improve Lt ankle ROM by 8 degrees all motions for improved function    Status Achieved  PT SHORT TERM GOAL #3   Title amb with single crutch and boot (if still indicated) without increase in pain or significant gait deviations    Status Achieved      PT SHORT TERM GOAL #4   Title negotiate stairs Mod I in order to return to sleeping in bedroom    Status Partially Met             PT Long Term Goals - 08/13/20 1624      PT LONG TERM GOAL #1   Title independent with final HEP    Status On-going      PT LONG TERM GOAL #2   Title demonstrate Lt ankle ROM to WNL for improved function    Status On-going      PT LONG TERM GOAL #3   Title demonstrate 3/5 Lt ankle strength for improved function    Status On-going      PT LONG TERM GOAL #4   Title amb without AD independently for improved function    Status On-going      PT LONG TERM GOAL #5   Title FOTO score improved to 58    Status On-going                 Plan - 08/22/20 1342    Clinical Impression Statement Progressing well with agility and strengthening exercises, and overall doing well with PT.  Reporting decreasing pain overall with jumping  activities.  Will continue to benefit from PT to maximize function.    Personal Factors and Comorbidities Comorbidity 1    Comorbidities arthritis    Examination-Activity Limitations Bathing;Squat;Stairs;Stand;Transfers;Locomotion Level;Lift    Stability/Clinical Decision Making Evolving/Moderate complexity    Rehab Potential Good    PT Frequency 2x / week    PT Duration 8 weeks    PT Treatment/Interventions ADLs/Self Care Home Management;Cryotherapy;Electrical Stimulation;Iontophoresis 82m/ml Dexamethasone;Moist Heat;Ultrasound;Therapeutic exercise;Therapeutic activities;Patient/family education;Manual techniques;Dry needling;Taping;Vasopneumatic Device;Balance training;Functional mobility training;Stair training;Gait training;DME Instruction;Neuromuscular re-education;Passive range of motion;Scar mobilization    PT Next Visit Plan continue gastroc strengthening, Lt ankle ROM/strength    PT Home Exercise Plan Access Code: [email or web address]   Consulted and Agree with Plan of Care Patient           Patient will benefit from skilled therapeutic intervention in order to improve the following deficits and impairments:  Decreased range of motion,Abnormal gait,Decreased endurance,Increased edema,Decreased scar mobility,Decreased knowledge of precautions,Decreased knowledge of use of DME,Decreased activity tolerance,Decreased mobility,Decreased balance,Difficulty walking,Decreased strength  Visit Diagnosis: Difficulty in walking, not elsewhere classified  Muscle weakness (generalized)  Stiffness of left ankle, not elsewhere classified  Pain in left ankle and joints of left foot  Localized edema  Other abnormalities of gait and mobility  Repeated falls     Problem List Patient Active Problem List   Diagnosis Date Noted  . Lisfranc dislocation, left, sequela   . Ankle syndesmosis disruption, left, sequela   . Generalized anxiety disorder 11/03/2017  . Neuropathy 11/04/2016  . Paresthesias  11/04/2016      SLaureen Abrahams PT, DPT 08/22/20 1:44 PM     CThe [facility] HospitalPhysical Therapy [address]GDunk[place] [address]Phone: [phone]  Fax:  [phone] Name: Ann MARZANMRN: [id number] of Birth: 104-07-1975

## 2020-08-27 ENCOUNTER — Encounter: Payer: 59 | Admitting: Physical Therapy

## 2020-08-29 ENCOUNTER — Other Ambulatory Visit: Payer: Self-pay

## 2020-08-29 ENCOUNTER — Encounter: Payer: Self-pay | Admitting: Physical Therapy

## 2020-08-29 ENCOUNTER — Ambulatory Visit: Payer: 59 | Admitting: Physical Therapy

## 2020-08-29 DIAGNOSIS — M25672 Stiffness of left ankle, not elsewhere classified: Secondary | ICD-10-CM

## 2020-08-29 DIAGNOSIS — R262 Difficulty in walking, not elsewhere classified: Secondary | ICD-10-CM | POA: Diagnosis not present

## 2020-08-29 DIAGNOSIS — R2689 Other abnormalities of gait and mobility: Secondary | ICD-10-CM

## 2020-08-29 DIAGNOSIS — M25572 Pain in left ankle and joints of left foot: Secondary | ICD-10-CM | POA: Diagnosis not present

## 2020-08-29 DIAGNOSIS — M6281 Muscle weakness (generalized): Secondary | ICD-10-CM

## 2020-08-29 DIAGNOSIS — R600 Localized edema: Secondary | ICD-10-CM

## 2020-08-29 DIAGNOSIS — R296 Repeated falls: Secondary | ICD-10-CM

## 2020-08-29 NOTE — Therapy (Signed)
[facility] Physical Therapy [address] [place], [place], [address] Phone: [phone]   Fax:  [phone]  Physical Therapy Treatment  Patient Details  Name: Ann Sampson MRN: [id number] Date of Birth: [birth year] Referring Provider (PT): Persons, Bevely Palmer, [place]   Encounter Date: 08/29/2020   PT End of Session - 08/29/20 1557    Visit Number 24    Number of Visits 32    Date for PT Re-Evaluation 09/17/20    Authorization Type [employer] OTHER    PT Start Time 7680    PT Stop Time 8811    PT Time Calculation (min) 42 min    Activity Tolerance No increased pain;Patient tolerated treatment well    Behavior During Therapy [facility] for tasks assessed/performed           Past Medical History:  Diagnosis Date  . Anxiety   . Arthritis   . Asthma   . Complication of anesthesia    with endoscopic spoke profeaty  . Depression   . Family history of adverse reaction to anesthesia    sister- vommitting  . Headache    magraines in the past  . Neuropathy    feet mainly- hands some  . Seasonal allergies     Past Surgical History:  Procedure Laterality Date  . BREAST SURGERY Bilateral    Breast Enhancement  . ESOPHAGOGASTRODUODENOSCOPY     x 3  last one approx 2002  . KNEE SURGERY Right    arthroscopy- cartilage  . ORIF ANKLE FRACTURE Left 04/18/2020   Procedure: OPEN REDUCTION INTERNAL FIXATION (ORIF) SYNDESMOSIS LEFT ANKLE AND LISFRANC JOINT LEFT FOOT;  Surgeon: Newt Minion, MD;  Location: [place];  Service: Orthopedics;  Laterality: Left;    There were no vitals filed for this visit.   Subjective Assessment - 08/29/20 1515    Subjective ankle is doing well.    Limitations Standing;Walking;House hold activities    Patient Stated Goals improve mobility, return to baseline    Currently in Pain? No/denies                             [facility] Adult PT Treatment/Exercise - 08/29/20 1516      Ankle Exercises: Aerobic    Recumbent Bike L5 x 8 min      Ankle Exercises: Stretches   Soleus Stretch 3 reps;30 seconds   slantboard   Gastroc Stretch 3 reps;30 seconds   slantboard     Ankle Exercises: Standing   SLS LLE with ball toss x 20 reps; intermittent RLE tap down on foam    Heel Raises Both   3x10; off slant board   Braiding (Round Trip) 220-      Ankle Exercises: Machines for Strengthening   Cybex Leg Press 100# 3x10      Ankle Exercises: Plyometrics   Bilateral Jumping 1 set;10 reps;Box Height: 4"                    PT Short Term Goals - 08/06/20 1526      PT SHORT TERM GOAL #1   Title Pt wil be Ind in an initial HEP.    Status Achieved      PT SHORT TERM GOAL #2   Title improve Lt ankle ROM by 8 degrees all motions for improved function    Status Achieved      PT SHORT TERM GOAL #3   Title amb with single  crutch and boot (if still indicated) without increase in pain or significant gait deviations    Status Achieved      PT SHORT TERM GOAL #4   Title negotiate stairs Mod I in order to return to sleeping in bedroom    Status Partially Met             PT Long Term Goals - 08/13/20 1624      PT LONG TERM GOAL #1   Title independent with final HEP    Status On-going      PT LONG TERM GOAL #2   Title demonstrate Lt ankle ROM to WNL for improved function    Status On-going      PT LONG TERM GOAL #3   Title demonstrate 3/5 Lt ankle strength for improved function    Status On-going      PT LONG TERM GOAL #4   Title amb without AD independently for improved function    Status On-going      PT LONG TERM GOAL #5   Title FOTO score improved to 58    Status On-going                 Plan - 08/29/20 1557    Clinical Impression Statement Continued work on balance, agility and strengthening today.  Progressing well and will continue to benefit from PT to maximize function.    Personal Factors and Comorbidities Comorbidity 1    Comorbidities arthritis     Examination-Activity Limitations Bathing;Squat;Stairs;Stand;Transfers;Locomotion Level;Lift    Stability/Clinical Decision Making Evolving/Moderate complexity    Rehab Potential Good    PT Frequency 2x / week    PT Duration 8 weeks    PT Treatment/Interventions ADLs/Self Care Home Management;Cryotherapy;Electrical Stimulation;Iontophoresis 89m/ml Dexamethasone;Moist Heat;Ultrasound;Therapeutic exercise;Therapeutic activities;Patient/family education;Manual techniques;Dry needling;Taping;Vasopneumatic Device;Balance training;Functional mobility training;Stair training;Gait training;DME Instruction;Neuromuscular re-education;Passive range of motion;Scar mobilization    PT Next Visit Plan continue gastroc strengthening, Lt ankle ROM/strength; lateral movements    PT Home Exercise Plan Access Code: [email or web address]   Consulted and Agree with Plan of Care Patient           Patient will benefit from skilled therapeutic intervention in order to improve the following deficits and impairments:  Decreased range of motion,Abnormal gait,Decreased endurance,Increased edema,Decreased scar mobility,Decreased knowledge of precautions,Decreased knowledge of use of DME,Decreased activity tolerance,Decreased mobility,Decreased balance,Difficulty walking,Decreased strength  Visit Diagnosis: Difficulty in walking, not elsewhere classified  Muscle weakness (generalized)  Stiffness of left ankle, not elsewhere classified  Pain in left ankle and joints of left foot  Localized edema  Other abnormalities of gait and mobility  Repeated falls     Problem List Patient Active Problem List   Diagnosis Date Noted  . Lisfranc dislocation, left, sequela   . Ankle syndesmosis disruption, left, sequela   . Generalized anxiety disorder 11/03/2017  . Neuropathy 11/04/2016  . Paresthesias 11/04/2016      SLaureen Abrahams PT, DPT 08/29/20 4:00 PM     [facility] [address] [address]Phone: [phone]  Fax:  [phone] Name: Ann RUBERGMRN: [id number] of Birth: [birth year]

## 2020-09-05 ENCOUNTER — Ambulatory Visit: Payer: 59 | Admitting: Physical Therapy

## 2020-09-05 ENCOUNTER — Encounter: Payer: Self-pay | Admitting: Physical Therapy

## 2020-09-05 ENCOUNTER — Other Ambulatory Visit: Payer: Self-pay

## 2020-09-05 DIAGNOSIS — M25672 Stiffness of left ankle, not elsewhere classified: Secondary | ICD-10-CM

## 2020-09-05 DIAGNOSIS — R262 Difficulty in walking, not elsewhere classified: Secondary | ICD-10-CM | POA: Diagnosis not present

## 2020-09-05 DIAGNOSIS — M6281 Muscle weakness (generalized): Secondary | ICD-10-CM | POA: Diagnosis not present

## 2020-09-05 DIAGNOSIS — R600 Localized edema: Secondary | ICD-10-CM

## 2020-09-05 DIAGNOSIS — R2689 Other abnormalities of gait and mobility: Secondary | ICD-10-CM

## 2020-09-05 DIAGNOSIS — M25572 Pain in left ankle and joints of left foot: Secondary | ICD-10-CM | POA: Diagnosis not present

## 2020-09-05 NOTE — Therapy (Signed)
[facility] Physical Therapy [address] [place], [place], [address] Phone: [phone]   Fax:  [phone]  Physical Therapy Treatment  Patient Details  Name: Ann Sampson MRN: [id number] Date of Birth: 05-03-1976 Referring Provider (PT): Persons, Bevely Palmer, [place]   Encounter Date: 09/05/2020   PT End of Session - 09/05/20 1551    Visit Number 25    Number of Visits 32    Date for PT Re-Evaluation 09/17/20    Authorization Type [employer] OTHER    PT Start Time 1312    PT Stop Time 1354    PT Time Calculation (min) 42 min    Activity Tolerance No increased pain;Patient tolerated treatment well    Behavior During Therapy [facility] for tasks assessed/performed           Past Medical History:  Diagnosis Date  . Anxiety   . Arthritis   . Asthma   . Complication of anesthesia    with endoscopic spoke profeaty  . Depression   . Family history of adverse reaction to anesthesia    sister- vommitting  . Headache    magraines in the past  . Neuropathy    feet mainly- hands some  . Seasonal allergies     Past Surgical History:  Procedure Laterality Date  . BREAST SURGERY Bilateral    Breast Enhancement  . ESOPHAGOGASTRODUODENOSCOPY     x 3  last one approx 2002  . KNEE SURGERY Right    arthroscopy- cartilage  . ORIF ANKLE FRACTURE Left 04/18/2020   Procedure: OPEN REDUCTION INTERNAL FIXATION (ORIF) SYNDESMOSIS LEFT ANKLE AND LISFRANC JOINT LEFT FOOT;  Surgeon: Newt Minion, MD;  Location: [place];  Service: Orthopedics;  Laterality: Left;    There were no vitals filed for this visit.   Subjective Assessment - 09/05/20 1515    Subjective no issues, ankle doing well    Limitations Standing;Walking;House hold activities    Patient Stated Goals improve mobility, return to baseline    Currently in Pain? No/denies                             [facility] Adult PT Treatment/Exercise - 09/05/20 1517      Ankle Exercises: Aerobic    Recumbent Bike L5 x 8 min      Ankle Exercises: Stretches   Soleus Stretch 3 reps;30 seconds   slantboard   Gastroc Stretch 3 reps;30 seconds   slantboard     Ankle Exercises: Standing   SLS LLE with soccer ball under Rt foot    Heel Raises Both   3x10; off slant board   Other Standing Ankle Exercises step down from 8" step working on form and coordination for balance      Ankle Exercises: Machines for Strengthening   Cybex Leg Press 100# 3x10                    PT Short Term Goals - 08/06/20 1526      PT SHORT TERM GOAL #1   Title Pt wil be Ind in an initial HEP.    Status Achieved      PT SHORT TERM GOAL #2   Title improve Lt ankle ROM by 8 degrees all motions for improved function    Status Achieved      PT SHORT TERM GOAL #3   Title amb with single crutch and boot (if still indicated) without increase in pain  or significant gait deviations    Status Achieved      PT SHORT TERM GOAL #4   Title negotiate stairs Mod I in order to return to sleeping in bedroom    Status Partially Met             PT Long Term Goals - 08/13/20 1624      PT LONG TERM GOAL #1   Title independent with final HEP    Status On-going      PT LONG TERM GOAL #2   Title demonstrate Lt ankle ROM to WNL for improved function    Status On-going      PT LONG TERM GOAL #3   Title demonstrate 3/5 Lt ankle strength for improved function    Status On-going      PT LONG TERM GOAL #4   Title amb without AD independently for improved function    Status On-going      PT LONG TERM GOAL #5   Title FOTO score improved to 58    Status On-going                 Plan - 09/05/20 1555    Clinical Impression Statement Pt tolerated session well today with continued focus on LLE strengthening and balance as well as working on descending stairs with increased confidence.  Will continue to benefit from PT to maximize function.  Anticipate d/c next 2-3 weeks.    Personal Factors and  Comorbidities Comorbidity 1    Comorbidities arthritis    Examination-Activity Limitations Bathing;Squat;Stairs;Stand;Transfers;Locomotion Level;Lift    Stability/Clinical Decision Making Evolving/Moderate complexity    Rehab Potential Good    PT Frequency 2x / week    PT Duration 8 weeks    PT Treatment/Interventions ADLs/Self Care Home Management;Cryotherapy;Electrical Stimulation;Iontophoresis 63m/ml Dexamethasone;Moist Heat;Ultrasound;Therapeutic exercise;Therapeutic activities;Patient/family education;Manual techniques;Dry needling;Taping;Vasopneumatic Device;Balance training;Functional mobility training;Stair training;Gait training;DME Instruction;Neuromuscular re-education;Passive range of motion;Scar mobilization    PT Next Visit Plan continue gastroc strengthening, Lt ankle ROM/strength; lateral movements    PT Home Exercise Plan Access Code: [email or web address]   Consulted and Agree with Plan of Care Patient           Patient will benefit from skilled therapeutic intervention in order to improve the following deficits and impairments:  Decreased range of motion,Abnormal gait,Decreased endurance,Increased edema,Decreased scar mobility,Decreased knowledge of precautions,Decreased knowledge of use of DME,Decreased activity tolerance,Decreased mobility,Decreased balance,Difficulty walking,Decreased strength  Visit Diagnosis: Difficulty in walking, not elsewhere classified  Muscle weakness (generalized)  Stiffness of left ankle, not elsewhere classified  Pain in left ankle and joints of left foot  Localized edema  Other abnormalities of gait and mobility     Problem List Patient Active Problem List   Diagnosis Date Noted  . Lisfranc dislocation, left, sequela   . Ankle syndesmosis disruption, left, sequela   . Generalized anxiety disorder 11/03/2017  . Neuropathy 11/04/2016  . Paresthesias 11/04/2016      SLaureen Abrahams PT, DPT 09/05/20 3:58 PM     [facility] [address] [address]Phone: [phone]  Fax:  [phone] Name: RMADDISON KILNERMRN: [id number] of Birth: 1December 13, 1977

## 2020-09-06 ENCOUNTER — Encounter: Payer: 59 | Admitting: Physical Therapy

## 2020-09-10 ENCOUNTER — Encounter: Payer: 59 | Admitting: Physical Therapy

## 2020-09-12 ENCOUNTER — Encounter: Payer: Self-pay | Admitting: Physical Therapy

## 2020-09-12 ENCOUNTER — Ambulatory Visit: Payer: 59 | Admitting: Physical Therapy

## 2020-09-12 ENCOUNTER — Other Ambulatory Visit: Payer: Self-pay

## 2020-09-12 DIAGNOSIS — R600 Localized edema: Secondary | ICD-10-CM

## 2020-09-12 DIAGNOSIS — M6281 Muscle weakness (generalized): Secondary | ICD-10-CM

## 2020-09-12 DIAGNOSIS — R262 Difficulty in walking, not elsewhere classified: Secondary | ICD-10-CM

## 2020-09-12 DIAGNOSIS — M25572 Pain in left ankle and joints of left foot: Secondary | ICD-10-CM

## 2020-09-12 DIAGNOSIS — R2689 Other abnormalities of gait and mobility: Secondary | ICD-10-CM

## 2020-09-12 DIAGNOSIS — M25672 Stiffness of left ankle, not elsewhere classified: Secondary | ICD-10-CM | POA: Diagnosis not present

## 2020-09-12 NOTE — Therapy (Signed)
[facility] Physical Therapy [address] [place], [place], [address] Phone: [phone]   Fax:  [phone]  Physical Therapy Treatment  Patient Details  Name: Ann Sampson MRN: [id number] Date of Birth: [birth year] Referring Provider (PT): Persons, Bevely Palmer, [place]   Encounter Date: 09/12/2020   PT End of Session - 09/12/20 1554    Visit Number 26    Number of Visits 32    Date for PT Re-Evaluation 09/17/20    Authorization Type [employer] OTHER    PT Start Time 1513    PT Stop Time 5993    PT Time Calculation (min) 41 min    Activity Tolerance No increased pain;Patient tolerated treatment well    Behavior During Therapy [facility] for tasks assessed/performed           Past Medical History:  Diagnosis Date  . Anxiety   . Arthritis   . Asthma   . Complication of anesthesia    with endoscopic spoke profeaty  . Depression   . Family history of adverse reaction to anesthesia    sister- vommitting  . Headache    magraines in the past  . Neuropathy    feet mainly- hands some  . Seasonal allergies     Past Surgical History:  Procedure Laterality Date  . BREAST SURGERY Bilateral    Breast Enhancement  . ESOPHAGOGASTRODUODENOSCOPY     x 3  last one approx 2002  . KNEE SURGERY Right    arthroscopy- cartilage  . ORIF ANKLE FRACTURE Left 04/18/2020   Procedure: OPEN REDUCTION INTERNAL FIXATION (ORIF) SYNDESMOSIS LEFT ANKLE AND LISFRANC JOINT LEFT FOOT;  Surgeon: Newt Minion, MD;  Location: [place];  Service: Orthopedics;  Laterality: Left;    There were no vitals filed for this visit.   Subjective Assessment - 09/12/20 1515    Subjective ankle doing well    Limitations Standing;Walking;House hold activities    Patient Stated Goals improve mobility, return to baseline    Currently in Pain? No/denies                             [facility] Adult PT Treatment/Exercise - 09/12/20 1520      Ankle Exercises: Machines for  Strengthening   Cybex Leg Press 100# 3x10      Ankle Exercises: Standing   Other Standing Ankle Exercises cable walk out with 45# 2x10      Ankle Exercises: Plyometrics   Bilateral Jumping 2 sets;10 reps;Box Height: 6"                    PT Short Term Goals - 08/06/20 1526      PT SHORT TERM GOAL #1   Title Pt wil be Ind in an initial HEP.    Status Achieved      PT SHORT TERM GOAL #2   Title improve Lt ankle ROM by 8 degrees all motions for improved function    Status Achieved      PT SHORT TERM GOAL #3   Title amb with single crutch and boot (if still indicated) without increase in pain or significant gait deviations    Status Achieved      PT SHORT TERM GOAL #4   Title negotiate stairs Mod I in order to return to sleeping in bedroom    Status Partially Met             PT Long Term Goals -  09/12/20 1555      PT LONG TERM GOAL #1   Title independent with final HEP    Status Achieved      PT LONG TERM GOAL #2   Title demonstrate Lt ankle ROM to WNL for improved function    Status On-going      PT LONG TERM GOAL #3   Title demonstrate 3/5 Lt ankle strength for improved function    Status On-going      PT LONG TERM GOAL #4   Title amb without AD independently for improved function    Status Achieved      PT LONG TERM GOAL #5   Title FOTO score improved to 58    Status On-going                 Plan - 09/12/20 1555    Clinical Impression Statement Pt has met 2 LTGs and is on track to meet goals next visit.  Plan for d/c next visit as she's doing very well.    Personal Factors and Comorbidities Comorbidity 1    Comorbidities arthritis    Examination-Activity Limitations Bathing;Squat;Stairs;Stand;Transfers;Locomotion Level;Lift    Stability/Clinical Decision Making Evolving/Moderate complexity    Rehab Potential Good    PT Frequency 2x / week    PT Duration 8 weeks    PT Treatment/Interventions ADLs/Self Care Home  Management;Cryotherapy;Electrical Stimulation;Iontophoresis 53m/ml Dexamethasone;Moist Heat;Ultrasound;Therapeutic exercise;Therapeutic activities;Patient/family education;Manual techniques;Dry needling;Taping;Vasopneumatic Device;Balance training;Functional mobility training;Stair training;Gait training;DME Instruction;Neuromuscular re-education;Passive range of motion;Scar mobilization    PT Next Visit Plan check goals, plan for d/c    PT Home Exercise Plan Access Code: [email or web address]   Consulted and Agree with Plan of Care Patient           Patient will benefit from skilled therapeutic intervention in order to improve the following deficits and impairments:  Decreased range of motion,Abnormal gait,Decreased endurance,Increased edema,Decreased scar mobility,Decreased knowledge of precautions,Decreased knowledge of use of DME,Decreased activity tolerance,Decreased mobility,Decreased balance,Difficulty walking,Decreased strength  Visit Diagnosis: Difficulty in walking, not elsewhere classified  Muscle weakness (generalized)  Stiffness of left ankle, not elsewhere classified  Pain in left ankle and joints of left foot  Localized edema  Other abnormalities of gait and mobility     Problem List Patient Active Problem List   Diagnosis Date Noted  . Lisfranc dislocation, left, sequela   . Ankle syndesmosis disruption, left, sequela   . Generalized anxiety disorder 11/03/2017  . Neuropathy 11/04/2016  . Paresthesias 11/04/2016      SLaureen Abrahams PT, DPT 09/12/20 3:56 PM     C[facility] CenterPhysical Therapy [address] [address]Phone: [phone]  Fax:  [phone] Name: Ann MUSAMRN: [id number] of Birth: 102-17-1977

## 2020-09-17 ENCOUNTER — Encounter: Payer: 59 | Admitting: Physical Therapy

## 2020-09-19 ENCOUNTER — Encounter: Payer: 59 | Admitting: Physical Therapy

## 2020-09-24 ENCOUNTER — Encounter: Payer: 59 | Admitting: Physical Therapy

## 2020-09-26 ENCOUNTER — Encounter: Payer: Self-pay | Admitting: Physical Therapy

## 2020-09-26 ENCOUNTER — Other Ambulatory Visit: Payer: Self-pay

## 2020-09-26 ENCOUNTER — Ambulatory Visit: Payer: 59 | Admitting: Physical Therapy

## 2020-09-26 DIAGNOSIS — M25572 Pain in left ankle and joints of left foot: Secondary | ICD-10-CM

## 2020-09-26 DIAGNOSIS — M25672 Stiffness of left ankle, not elsewhere classified: Secondary | ICD-10-CM

## 2020-09-26 DIAGNOSIS — R2689 Other abnormalities of gait and mobility: Secondary | ICD-10-CM

## 2020-09-26 DIAGNOSIS — M6281 Muscle weakness (generalized): Secondary | ICD-10-CM | POA: Diagnosis not present

## 2020-09-26 DIAGNOSIS — R262 Difficulty in walking, not elsewhere classified: Secondary | ICD-10-CM

## 2020-09-26 DIAGNOSIS — R600 Localized edema: Secondary | ICD-10-CM

## 2020-09-26 NOTE — Therapy (Signed)
[facility] [address] [place], [place], [address] Phone: [phone]   Fax:  [phone]  Physical Therapy Treatment/Discharge Summary  Patient Details  Name: Ann Sampson MRN: [id number] Date of Birth: 03-Aug-1975 Referring Provider (PT): Persons, Bevely Palmer, [place]   Encounter Date: 09/26/2020   PT End of Session - 09/26/20 1550    Visit Number 27    Authorization Type [employer] OTHER    PT Start Time 5997    PT Stop Time 1545    PT Time Calculation (min) 30 min    Activity Tolerance No increased pain;Patient tolerated treatment well    Behavior During Therapy [facility] for tasks assessed/performed           Past Medical History:  Diagnosis Date  . Anxiety   . Arthritis   . Asthma   . Complication of anesthesia    with endoscopic spoke profeaty  . Depression   . Family history of adverse reaction to anesthesia    sister- vommitting  . Headache    magraines in the past  . Neuropathy    feet mainly- hands some  . Seasonal allergies     Past Surgical History:  Procedure Laterality Date  . BREAST SURGERY Bilateral    Breast Enhancement  . ESOPHAGOGASTRODUODENOSCOPY     x 3  last one approx 2002  . KNEE SURGERY Right    arthroscopy- cartilage  . ORIF ANKLE FRACTURE Left 04/18/2020   Procedure: OPEN REDUCTION INTERNAL FIXATION (ORIF) SYNDESMOSIS LEFT ANKLE AND LISFRANC JOINT LEFT FOOT;  Surgeon: Newt Minion, MD;  Location: [place];  Service: Orthopedics;  Laterality: Left;    There were no vitals filed for this visit.   Subjective Assessment - 09/26/20 1520    Subjective no c/o about ankle    Limitations Standing;Walking;House hold activities    Patient Stated Goals improve mobility, return to baseline    Currently in Pain? No/denies              [facility] PT Assessment - 09/26/20 1529      Assessment   Medical Diagnosis Status post ORIF Syndesmosis and Lis Franc fracture    Referring Provider (PT) Persons, Bevely Palmer, [place]      Observation/Other Assessments   Focus on Therapeutic Outcomes (FOTO)  78      AROM   Left Ankle Dorsiflexion 12    Left Ankle Plantar Flexion 57    Left Ankle Inversion 28    Left Ankle Eversion 18      Strength   Left Ankle Dorsiflexion 5/5    Left Ankle Plantar Flexion 5/5    Left Ankle Inversion 5/5    Left Ankle Eversion 5/5                         [facility] Adult PT Treatment/Exercise - 09/26/20 1521      Ankle Exercises: Aerobic   Recumbent Bike L5 x 8 min      Ankle Exercises: Standing   Heel Raises Left   25 reps                 PT Education - 09/26/20 1549    Education Details continue with current exercise program - pt active with Peloton cycling and strength classes    Person(s) Educated Patient    Methods Explanation    Comprehension Verbalized understanding            PT Short Term  Goals - 09/26/20 1550      PT SHORT TERM GOAL #1   Title Pt wil be Ind in an initial HEP.    Status Achieved      PT SHORT TERM GOAL #2   Title improve Lt ankle ROM by 8 degrees all motions for improved function    Status Achieved      PT SHORT TERM GOAL #3   Title amb with single crutch and boot (if still indicated) without increase in pain or significant gait deviations    Status Achieved      PT SHORT TERM GOAL #4   Title negotiate stairs Mod I in order to return to sleeping in bedroom    Status Achieved             PT Long Term Goals - 09/26/20 1550      PT LONG TERM GOAL #1   Title independent with final HEP    Status Achieved      PT LONG TERM GOAL #2   Title demonstrate Lt ankle ROM to WNL for improved function    Status Achieved      PT LONG TERM GOAL #3   Title demonstrate 3/5 Lt ankle strength for improved function    Status Achieved      PT LONG TERM GOAL #4   Title amb without AD independently for improved function    Status Achieved      PT LONG TERM GOAL #5   Title FOTO score improved to 58    Status  Achieved                 Plan - 09/26/20 1550    Clinical Impression Statement Pt has met all LTGs and is ready to d/c from PT.  She is very active with Peloton cycling and strengthening classes and encouraged to continue to maximize gains made in PT.    Personal Factors and Comorbidities Comorbidity 1    Comorbidities arthritis    Examination-Activity Limitations Bathing;Squat;Stairs;Stand;Transfers;Locomotion Level;Lift    Stability/Clinical Decision Making Evolving/Moderate complexity    Rehab Potential Good    PT Frequency 2x / week    PT Duration 8 weeks    PT Treatment/Interventions ADLs/Self Care Home Management;Cryotherapy;Electrical Stimulation;Iontophoresis 22m/ml Dexamethasone;Moist Heat;Ultrasound;Therapeutic exercise;Therapeutic activities;Patient/family education;Manual techniques;Dry needling;Taping;Vasopneumatic Device;Balance training;Functional mobility training;Stair training;Gait training;DME Instruction;Neuromuscular re-education;Passive range of motion;Scar mobilization    PT Next Visit Plan d/c PT today    PT Home Exercise Plan Access Code: [email or web address]   Consulted and Agree with Plan of Care Patient           Patient will benefit from skilled therapeutic intervention in order to improve the following deficits and impairments:  Decreased range of motion,Abnormal gait,Decreased endurance,Increased edema,Decreased scar mobility,Decreased knowledge of precautions,Decreased knowledge of use of DME,Decreased activity tolerance,Decreased mobility,Decreased balance,Difficulty walking,Decreased strength  Visit Diagnosis: Difficulty in walking, not elsewhere classified  Muscle weakness (generalized)  Stiffness of left ankle, not elsewhere classified  Pain in left ankle and joints of left foot  Localized edema  Other abnormalities of gait and mobility     Problem List Patient Active Problem List   Diagnosis Date Noted  . Lisfranc dislocation, left, sequela    . Ankle syndesmosis disruption, left, sequela   . Generalized anxiety disorder 11/03/2017  . Neuropathy 11/04/2016  . Paresthesias 11/04/2016     SLaureen Abrahams PT, DPT 09/26/20 3:52 PM   [facility] [address] [place] [address]:  [phone]   Fax:  [phone]  Name: Ann Sampson MRN: [id number] Date of Birth: 12/24/1975    PHYSICAL THERAPY DISCHARGE SUMMARY  Visits from Start of Care: 27  Current functional level related to goals / functional outcomes: See above   Remaining deficits: See above   Education / Equipment: HEP  Plan: Patient agrees to discharge.  Patient goals were met. Patient is being discharged due to meeting the stated rehab goals.  ?????     Laureen Abrahams, PT, DPT 09/26/20 3:52 PM  [facility] [address] [place], [place], [address] Phone: [phone]   Fax:  [phone]

## 2020-12-04 ENCOUNTER — Telehealth: Payer: Self-pay | Admitting: Family Medicine

## 2020-12-04 ENCOUNTER — Other Ambulatory Visit: Payer: Self-pay

## 2020-12-04 DIAGNOSIS — Z91038 Other insect allergy status: Secondary | ICD-10-CM

## 2020-12-04 DIAGNOSIS — Z87892 Personal history of anaphylaxis: Secondary | ICD-10-CM

## 2020-12-04 MED ORDER — EPINEPHRINE 0.3 MG/0.3ML IJ SOAJ: INTRAMUSCULAR | 0 refills | Status: DC

## 2020-12-04 NOTE — Telephone Encounter (Signed)
..  Medication Refills  Last OV:  Medication:  EPI-PEN  Pharmacy:  [facility] [place] Let patient know to contact pharmacy at the end of the day to make sure medication is ready.   Please notify patient to allow 48-72 hours to process.  Encourage patient to contact the pharmacy for refills or they can request refills through [facility]  Clinical Fills out below:   Last refill:  QTY:  Refill Date:    Other Comments:   Okay for refill?  Please advise.

## 2020-12-04 NOTE — Telephone Encounter (Signed)
Medication sent to the pharmacy.

## 2021-01-03 ENCOUNTER — Ambulatory Visit: Payer: 59 | Admitting: Family Medicine

## 2021-01-03 ENCOUNTER — Encounter: Payer: Self-pay | Admitting: Family Medicine

## 2021-01-03 ENCOUNTER — Other Ambulatory Visit: Payer: Self-pay

## 2021-01-03 VITALS — BP 126/74 | HR 53 | Temp 98.1°F | Resp 16 | Ht 65.0 in | Wt 174.6 lb

## 2021-01-03 DIAGNOSIS — R739 Hyperglycemia, unspecified: Secondary | ICD-10-CM | POA: Diagnosis not present

## 2021-01-03 DIAGNOSIS — F411 Generalized anxiety disorder: Secondary | ICD-10-CM | POA: Diagnosis not present

## 2021-01-03 DIAGNOSIS — E785 Hyperlipidemia, unspecified: Secondary | ICD-10-CM

## 2021-01-03 MED ORDER — ESCITALOPRAM OXALATE 20 MG PO TABS: 20.0000 mg | ORAL_TABLET | Freq: Every day | ORAL | 1 refills | Status: DC

## 2021-01-03 NOTE — Progress Notes (Signed)
Subjective:  Patient ID: Ann Sampson, female    DOB: 23-Mar-1976  Age: 45 y.o. MRN: [id number]  CC:  Chief Complaint  Patient presents with   Anxiety    Pt reports doing better than a few months ago reports not used xanax does need refill lexapro    Hyperlipidemia    Pt had elevated readings on last check and was advised to have recheck today     HPI Ann Sampson presents for   Anxiety: Generalized anxiety disorder, Lexapro 20 mg daily.  Some increased rest at that time.  Situational anxiety.  Temporary prescription for alprazolam for breakthrough symptoms.  Has not needed Xanax.  symptoms have improved.  Tolerating current dose Lexapro. Stress better. Exercise has helped as well.   Hyperglycemia: 129 3 yrs ago, 107 in January.  No results found for: HGBA1C  Hyperlipidemia: Diet/exercise approach with low ASCVD risk score. No current statin. Lost 17# with increased exercise/Peloton, strength classes.    Wt Readings from Last 3 Encounters:  01/03/21 174 lb 9.6 oz (79.2 kg)  07/06/20 191 lb (86.6 kg)  07/04/20 193 lb (87.5 kg)    The 10-year ASCVD risk score Denman George DC Jr., et al., 2013) is: 1.1%   Values used to calculate the score:     Age: 37 years     Sex: Female     Is Non-Hispanic African American: No     Diabetic: No     Tobacco smoker: No     Systolic Blood Pressure: 126 mmHg     Is BP treated: No     HDL Cholesterol: 64 mg/dL     Total Cholesterol: 281 mg/dL  Lab Results  Component Value Date   CHOL 281 (H) 07/06/2020   HDL 64 07/06/2020   LDLCALC 199 (H) 07/06/2020   TRIG 105 07/06/2020   CHOLHDL 4.4 07/06/2020   Lab Results  Component Value Date   ALT 18 07/06/2020   AST 19 07/06/2020   ALKPHOS 61 07/06/2020   BILITOT 0.3 07/06/2020    History Patient Active Problem List   Diagnosis Date Noted   Lisfranc dislocation, left, sequela    Ankle syndesmosis disruption, left, sequela    Generalized anxiety disorder 11/03/2017   Neuropathy  11/04/2016   Paresthesias 11/04/2016   Past Medical History:  Diagnosis Date   Anxiety    Arthritis    Asthma    Complication of anesthesia    with endoscopic spoke profeaty   Depression    Family history of adverse reaction to anesthesia    sister- vommitting   Headache    magraines in the past   Neuropathy    feet mainly- hands some   Seasonal allergies    Past Surgical History:  Procedure Laterality Date   BREAST SURGERY Bilateral    Breast Enhancement   ESOPHAGOGASTRODUODENOSCOPY     x 3  last one approx 2002   KNEE SURGERY Right    arthroscopy- cartilage   ORIF ANKLE FRACTURE Left 04/18/2020   Procedure: OPEN REDUCTION INTERNAL FIXATION (ORIF) SYNDESMOSIS LEFT ANKLE AND LISFRANC JOINT LEFT FOOT;  Surgeon: Nadara Mustard, MD;  Location[facility];  Service: Orthopedics;  Laterality: Left;   Allergies  Allergen Reactions   Bee Venom Anaphylaxis   Hornet Venom Anaphylaxis   Latex Hives and Shortness Of Breath   Prednisone Anaphylaxis and Rash   Chap-Aid [Chapstick] Swelling    Swelling of lips   Bextra [Valdecoxib] Rash   Flagyl [Metronidazole]  Rash   Vioxx [Rofecoxib] Rash   Prior to Admission medications   Medication Sig Start Date End Date Taking? Authorizing Provider  ALPRAZolam (XANAX) 0.25 MG tablet Take 1 tablet (0.25 mg total) by mouth 2 (two) times daily as needed for anxiety. 07/06/20  Yes Shade Flood, MD  clobetasol cream (TEMOVATE) 0.05 % Apply 1 application topically 2 (two) times daily. To affected lesions 07/04/20  Yes Shade Flood, MD  COLLAGEN PO Take 15 g by mouth daily.   Yes [provider]  Emollient (ROC RETINOL CORREXION) CREA Apply 1 application topically every other day.   Yes [provider]  EPINEPHrine 0.3 mg/0.3 mL IJ SOAJ injection USE AS DIRECTED 12/04/20  Yes Shade Flood, MD  escitalopram (LEXAPRO) 20 MG tablet Take 1 tablet (20 mg total) by mouth daily. 07/06/20  Yes Shade Flood, MD  Multiple  Vitamins-Minerals (ADULT GUMMY PO) Take 2 capsules by mouth daily.    Yes [provider]  mupirocin ointment (BACTROBAN) 2 % SMARTSIG:1 Application Topical 2-3 Times Daily 07/05/20  Yes [provider]  aspirin EC 81 MG tablet Take 1 tablet (81 mg total) by mouth daily. Swallow whole. 04/18/20 04/18/21  Persons, [place], PA  gabapentin (NEURONTIN) 300 MG capsule Take 1 capsule (300 mg total) by mouth at bedtime. Patient not taking: Reported on 01/03/2021 12/01/19   Ihor Austin, NP   Social History   Socioeconomic History   Marital status: Unknown    Spouse name: Not on file   Number of children: Not on file   Years of education: Not on file   Highest education level: Not on file  Occupational History   Not on file  Tobacco Use   Smoking status: Former    Pack years: 0.00   Smokeless tobacco: Never  Vaping Use   Vaping Use: Never used  Substance and Sexual Activity   Alcohol use: Yes    Alcohol/week: 1.0 standard drink    Types: 1 Glasses of wine per week    Comment: occcasionally   Drug use: No   Sexual activity: Not Currently  Other Topics Concern   Not on file  Social History Narrative   Not on file   Social Determinants of Health   Financial Resource Strain: Not on file  Food Insecurity: Not on file  Transportation Needs: Not on file  Physical Activity: Not on file  Stress: Not on file  Social Connections: Not on file  Intimate Partner Violence: Not on file    Review of Systems  Constitutional:  Negative for fatigue and unexpected weight change.  Respiratory:  Negative for chest tightness and shortness of breath.   Cardiovascular:  Negative for chest pain, palpitations and leg swelling.  Gastrointestinal:  Negative for abdominal pain and blood in stool.  Neurological:  Negative for dizziness, syncope, light-headedness and headaches.  Psychiatric/Behavioral:  Negative for sleep disturbance and suicidal ideas. The patient is not nervous/anxious.      Objective:   Vitals:   01/03/21 1420  BP: 126/74  Pulse: (!) 53  Resp: 16  Temp: 98.1 F (36.7 C)  TempSrc: Temporal  SpO2: 100%  Weight: 174 lb 9.6 oz (79.2 kg)  Height: 5\' 5"  (1.651 m)     Physical Exam Vitals reviewed.  Constitutional:      Appearance: Normal appearance. She is well-developed.  HENT:     Head: Normocephalic and atraumatic.  Eyes:     Conjunctiva/sclera: Conjunctivae normal.  Pupils: Pupils are equal, round, and reactive to light.  Neck:     Vascular: No carotid bruit.  Cardiovascular:     Rate and Rhythm: Normal rate and regular rhythm.     Heart sounds: Normal heart sounds.  Pulmonary:     Effort: Pulmonary effort is normal.     Breath sounds: Normal breath sounds.  Abdominal:     Palpations: Abdomen is soft. There is no pulsatile mass.     Tenderness: There is no abdominal tenderness.  Musculoskeletal:     Right lower leg: No edema.     Left lower leg: No edema.  Skin:    General: Skin is warm and dry.  Neurological:     Mental Status: She is alert and oriented to person, place, and time.  Psychiatric:        Mood and Affect: Mood normal.        Behavior: Behavior normal.       Assessment & Plan:  Ann Sampson is a 45 y.o. female . Hyperlipidemia, unspecified hyperlipidemia type - Plan: Lipid panel  -Low ASCVD risk score.  Repeat labs, anticipate improvement with exercise.  Commended on weight loss.  Generalized anxiety disorder - Plan: escitalopram (LEXAPRO) 20 MG tablet  -Stable, continue Lexapro.  Has alprazolam but has not needed.  Recheck 6 months.  Increased exercise also likely helping.  Hyperglycemia - Plan: Hemoglobin A1c, Comprehensive metabolic panel  -Check A1c, but again anticipate improvement with exercise.  Meds ordered this encounter  Medications   escitalopram (LEXAPRO) 20 MG tablet    Sig: Take 1 tablet (20 mg total) by mouth daily.    Dispense:  90 tablet    Refill:  1   Patient Instructions   Return for lab only visit to check a 61-month blood sugar test as well as cholesterol but expect this to be improved.  Great work on the exercise and weight loss.  No med changes today.  Thanks for coming in today.  Recheck in 6 months for physical.    Signed,   [name], MD [facility], [facility] 01/03/21 3:11 PM

## 2021-01-03 NOTE — Patient Instructions (Signed)
Return for lab only visit to check a 62-month blood sugar test as well as cholesterol but expect this to be improved.  Great work on the exercise and weight loss.  No med changes today.  Thanks for coming in today.  Recheck in 6 months for physical.

## 2021-01-31 ENCOUNTER — Other Ambulatory Visit: Payer: 59

## 2021-06-24 IMAGING — DX DG FOOT COMPLETE 3+V*L*
3 series · 3 of 3 positions shown · non-contrast
Comparison: Foot and ankle radiographs 9 days ago 03/14/2020

CLINICAL DATA: Left lower leg, ankle, and foot pain. Injury 9 days
ago.

EXAM:
LEFT FOOT - COMPLETE 3+ VIEW

[foot ap]
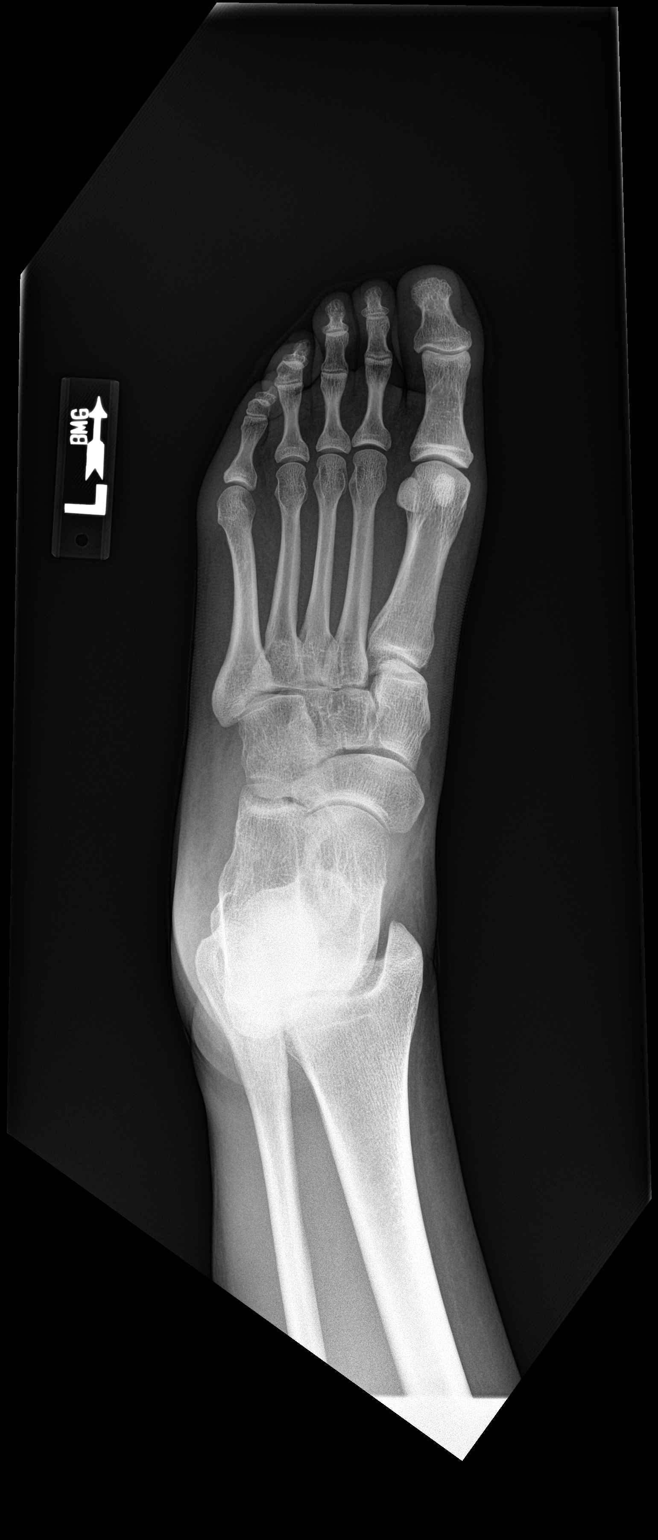

[foot obl]
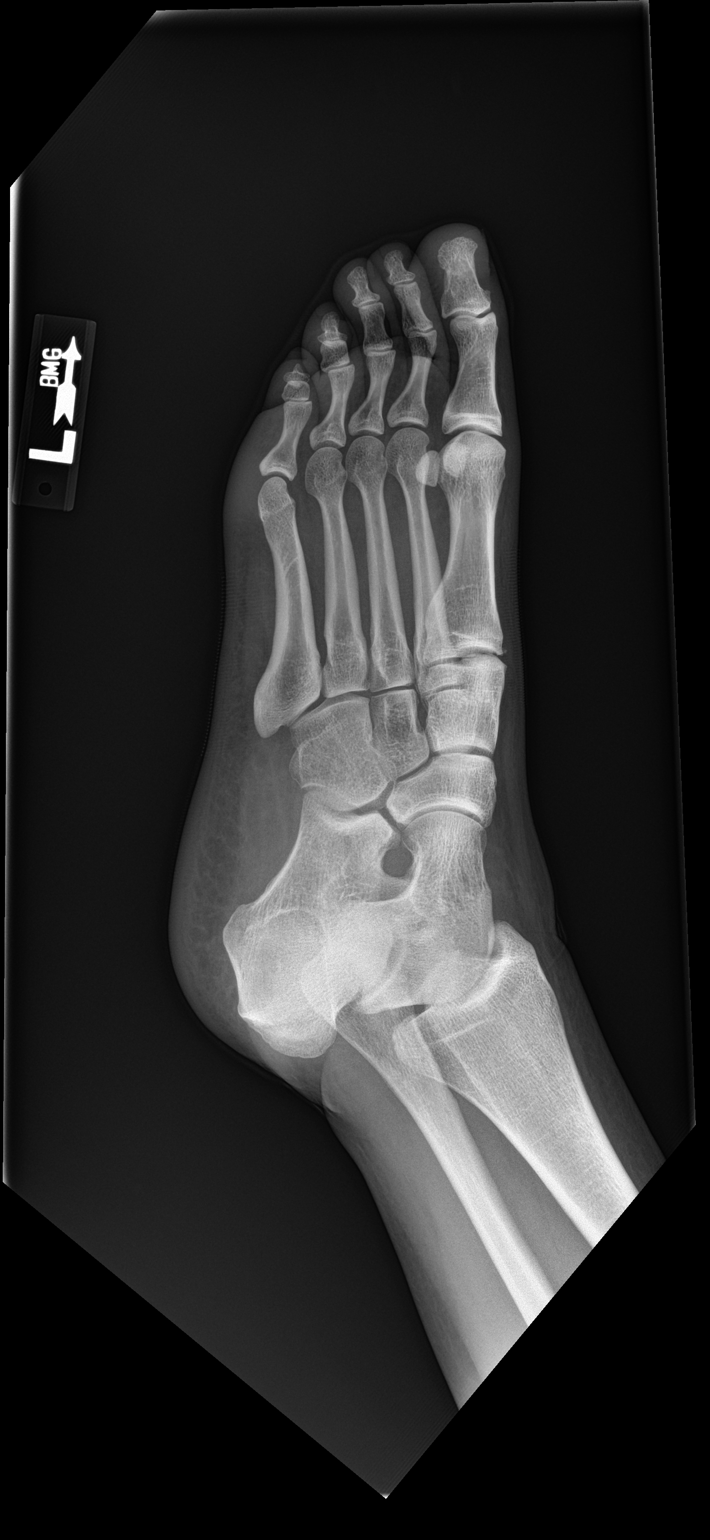

[foot lat]
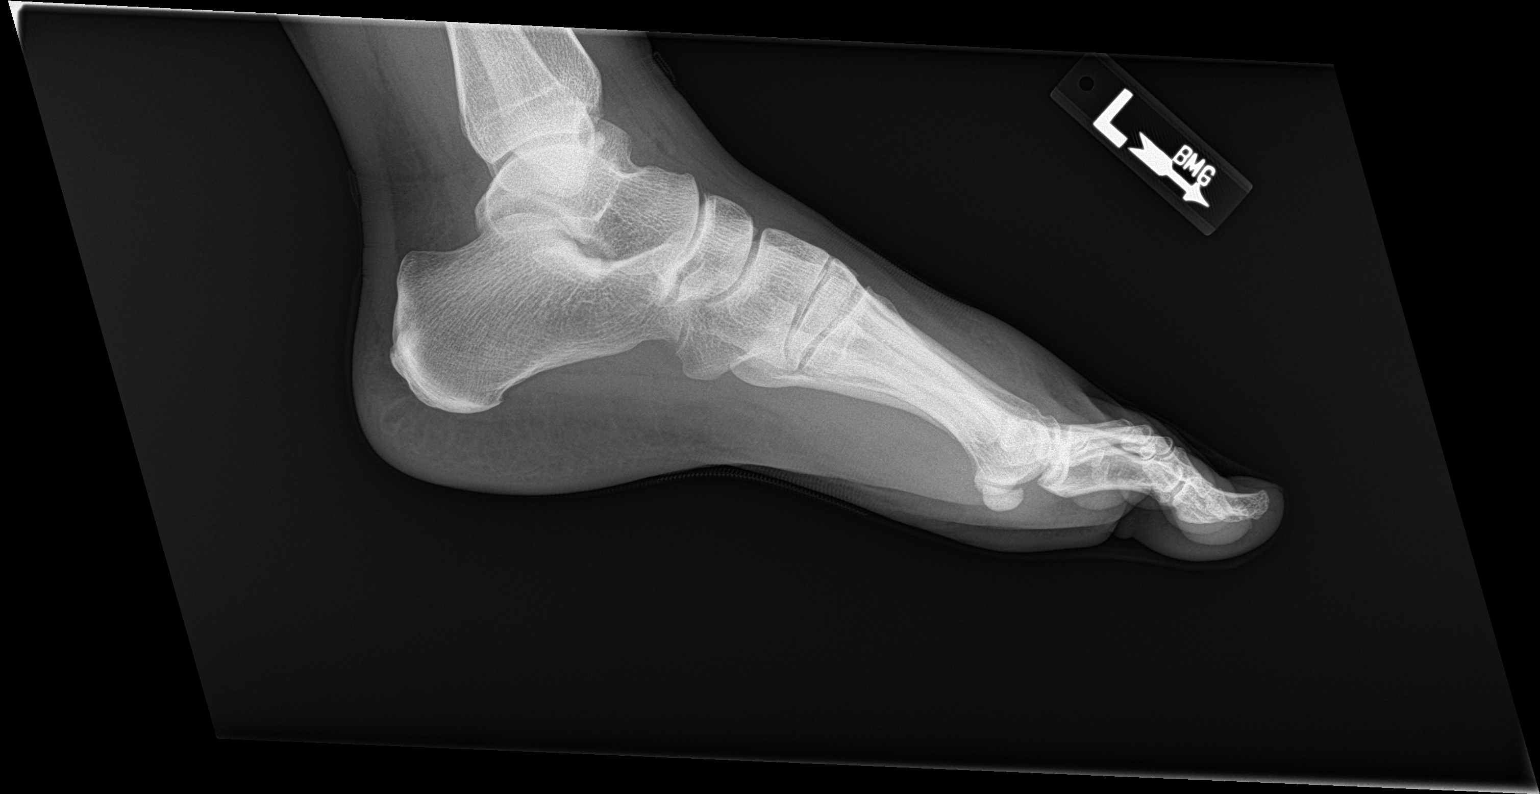

[3 of 3 positions shown; findings below may reference images not displayed]

FINDINGS: There is no evidence of fracture or dislocation. The navicular
appears intact. There is no evidence of arthropathy or other focal
bone abnormality. There may be a small ankle joint effusion. There
is soft tissue edema overlying the metatarsals dorsally.
IMPRESSION: Dorsal soft tissue edema. No fracture or subluxation of the foot.

## 2021-07-10 ENCOUNTER — Encounter: Payer: Self-pay | Admitting: Family Medicine

## 2021-07-10 ENCOUNTER — Ambulatory Visit (INDEPENDENT_AMBULATORY_CARE_PROVIDER_SITE_OTHER): Payer: 59 | Admitting: Family Medicine

## 2021-07-10 VITALS — BP 132/76 | HR 79 | Temp 98.4°F | Resp 16 | Ht 65.0 in | Wt 201.0 lb

## 2021-07-10 DIAGNOSIS — R739 Hyperglycemia, unspecified: Secondary | ICD-10-CM

## 2021-07-10 DIAGNOSIS — R21 Rash and other nonspecific skin eruption: Secondary | ICD-10-CM

## 2021-07-10 DIAGNOSIS — E785 Hyperlipidemia, unspecified: Secondary | ICD-10-CM

## 2021-07-10 DIAGNOSIS — Z0000 Encounter for general adult medical examination without abnormal findings: Secondary | ICD-10-CM | POA: Diagnosis not present

## 2021-07-10 DIAGNOSIS — F411 Generalized anxiety disorder: Secondary | ICD-10-CM

## 2021-07-10 DIAGNOSIS — Z1211 Encounter for screening for malignant neoplasm of colon: Secondary | ICD-10-CM

## 2021-07-10 DIAGNOSIS — H938X3 Other specified disorders of ear, bilateral: Secondary | ICD-10-CM

## 2021-07-10 DIAGNOSIS — Z130 Encounter for screening for diseases of the blood and blood-forming organs and certain disorders involving the immune mechanism: Secondary | ICD-10-CM | POA: Diagnosis not present

## 2021-07-10 LAB — COMPREHENSIVE METABOLIC PANEL WITH GFR
ALT: 14 U/L (ref 0–35)
AST: 17 U/L (ref 0–37)
Albumin: 4.3 g/dL (ref 3.5–5.2)
Alkaline Phosphatase: 43 U/L (ref 39–117)
BUN: 15 mg/dL (ref 6–23)
CO2: 30 meq/L (ref 19–32)
Calcium: 9.1 mg/dL (ref 8.4–10.5)
Chloride: 103 meq/L (ref 96–112)
Creatinine, Ser: 0.72 mg/dL (ref 0.40–1.20)
GFR: 100.49 mL/min
Glucose, Bld: 94 mg/dL (ref 70–99)
Potassium: 4.2 meq/L (ref 3.5–5.1)
Sodium: 138 meq/L (ref 135–145)
Total Bilirubin: 0.4 mg/dL (ref 0.2–1.2)
Total Protein: 6.6 g/dL (ref 6.0–8.3)

## 2021-07-10 LAB — CBC WITH DIFFERENTIAL/PLATELET
Basophils Absolute: 0.1 10*3/uL (ref 0.0–0.1)
Basophils Relative: 1.1 % (ref 0.0–3.0)
Eosinophils Absolute: 0.1 10*3/uL (ref 0.0–0.7)
Eosinophils Relative: 1.7 % (ref 0.0–5.0)
HCT: 42.1 % (ref 36.0–46.0)
Hemoglobin: 14.2 g/dL (ref 12.0–15.0)
Lymphocytes Relative: 33.5 % (ref 12.0–46.0)
Lymphs Abs: 1.8 10*3/uL (ref 0.7–4.0)
MCHC: 33.6 g/dL (ref 30.0–36.0)
MCV: 93.1 fl (ref 78.0–100.0)
Monocytes Absolute: 0.4 10*3/uL (ref 0.1–1.0)
Monocytes Relative: 6.6 % (ref 3.0–12.0)
Neutro Abs: 3.2 10*3/uL (ref 1.4–7.7)
Neutrophils Relative %: 57.1 % (ref 43.0–77.0)
Platelets: 266 10*3/uL (ref 150.0–400.0)
RBC: 4.53 Mil/uL (ref 3.87–5.11)
RDW: 12.8 % (ref 11.5–15.5)
WBC: 5.5 10*3/uL (ref 4.0–10.5)

## 2021-07-10 LAB — LIPID PANEL
Cholesterol: 281 mg/dL — ABNORMAL HIGH (ref 0–200)
HDL: 60.4 mg/dL (ref >39.00)
LDL Cholesterol: 197 mg/dL — ABNORMAL HIGH (ref 0–99)
NonHDL: 220.46
Total CHOL/HDL Ratio: 5
Triglycerides: 119 mg/dL (ref 0.0–149.0)
VLDL: 23.8 mg/dL (ref 0.0–40.0)

## 2021-07-10 LAB — HEMOGLOBIN A1C: Hgb A1c MFr Bld: 5.4 % (ref 4.6–6.5)

## 2021-07-10 MED ORDER — ESCITALOPRAM OXALATE 20 MG PO TABS: 20.0000 mg | ORAL_TABLET | Freq: Every day | ORAL | 1 refills | Status: DC

## 2021-07-10 NOTE — Progress Notes (Signed)
Subjective:  Patient ID: Ann Sampson, female    DOB: June 20, 1976  Age: 46 y.o. MRN: [id number]  CC:  Chief Complaint  Patient presents with   Annual Exam    Pt here for physical doing well but is having some itching behind her ears and back of her neck she would like looked at as she cannot see if theres any rash     HPI Ann Sampson presents for  Annual exam, med review, concern as above  Itching of ears/neck Past few days, no known rash, but unable to see. Back side of neck. No treatments.  Bilaterral.  No new derm products, possible new mask uses.   Generalized anxiety disorder Lexapro 20 mg daily.  Increase stressors discussed previously.  Alprazolam for breakthrough symptoms only as needed, minimal needed at last visit. Doing well. No new side effects.  Depression screen [facility] 2/9 07/10/2021 01/03/2021 07/06/2020 07/04/2020 02/15/2020  Decreased Interest 0 0 0 0 0  Down, Depressed, Hopeless 0 0 0 0 0  PHQ - 2 Score 0 0 0 0 0  Altered sleeping 1 1 - - -  Tired, decreased energy 2 1 - - -  Change in appetite 0 1 - - -  Feeling bad or failure about yourself  0 0 - - -  Trouble concentrating 0 1 - - -  Moving slowly or fidgety/restless 0 0 - - -  Suicidal thoughts 0 0 - - -  PHQ-9 Score 3 4 - - -   GAD 7 : Generalized Anxiety Score 07/10/2021 01/03/2021 02/15/2020 08/10/2019  Nervous, Anxious, on Edge '1 3 3 3  ' Control/stop worrying 0 '1 2 2  ' Worry too much - different things '1 1 2 2  ' Trouble relaxing 0 '1 1 2  ' Restless '1 2 1 2  ' Easily annoyed or irritable 0 1 0 2  Afraid - awful might happen 0 0 0 0  Total GAD 7 Score '3 9 9 13    ' Hyperlipidemia: Low ASCVD risk score previously.  Diet/exercise approach.  Weight had significantly improved with exercise, Peloton, strength classes.  Has regained weight since July.has decreased exercise - busy with work, son on 4 sports  The 10-year ASCVD risk score (Arnett DK, et al., 2019) is: 1.3%   Values used to calculate the score:      Age: 46 years     Sex: Female     Is Non-Hispanic African American: No     Diabetic: No     Tobacco smoker: No     Systolic Blood Pressure: 932 mmHg     Is BP treated: No     HDL Cholesterol: 64 mg/dL     Total Cholesterol: 281 mg/dL  Wt Readings from Last 3 Encounters:  07/10/21 201 lb (91.2 kg)  01/03/21 174 lb 9.6 oz (79.2 kg)  07/06/20 191 lb (86.6 kg)    Lab Results  Component Value Date   CHOL 281 (H) 07/06/2020   HDL 64 07/06/2020   LDLCALC 199 (H) 07/06/2020   TRIG 105 07/06/2020   CHOLHDL 4.4 07/06/2020   Lab Results  Component Value Date   ALT 18 07/06/2020   AST 19 07/06/2020   ALKPHOS 61 07/06/2020   BILITOT 0.3 07/06/2020   Hyperglycemia/obesity: Plan for future lab visit in July 2022, has not had recent lab work.  Glucose 129 in 2019, 107 in January 2022.  Weight has increased as above.  Home weight change: not checking.  Current exercise  as above - plans to recheck.  Body mass index is 33.45 kg/m.   Cancer screening Colon: Screening options with colonoscopy versus Cologuard discussed. Discussed timing of repeat testing intervals if normal, as well as potential need for diagnostic Colonoscopy if positive Cologuard. Understanding expressed, and chose Cologuard. No FH colon CA.  Mammogram 2018.  - agrees to have  Pap testing: no OBGYN. Pap negative 05/05/18, with neg HPV testing.    Immunization History  Administered Date(s) Administered   Influenza Split 03/30/2014   Influenza,inj,Quad PF,6+ Mos 03/23/2020   Influenza-Unspecified 03/30/2016, 03/30/2017, 04/08/2018, 04/12/2021   PFIZER(Purple Top)SARS-COV-2 Vaccination 07/22/2019, 08/12/2019, 06/08/2020, 04/12/2021   Tdap 02/07/2019   Vision Screening   Right eye Left eye Both eyes  Without correction     With correction 20/20-1 20/20 20/20   Contact lenses.  Last optho - 6 months ago.   Dental: every 6 months, recent visit last month.   Alcohol: occasional 3 per week.   Tobacco: none.    Exercise as above.   History Patient Active Problem List   Diagnosis Date Noted   Lisfranc dislocation, left, sequela    Ankle syndesmosis disruption, left, sequela    Generalized anxiety disorder 11/03/2017   Neuropathy 11/04/2016   Paresthesias 11/04/2016   Past Medical History:  Diagnosis Date   Anxiety    Arthritis    Asthma    Complication of anesthesia    with endoscopic spoke profeaty   Depression    Family history of adverse reaction to anesthesia    sister- vommitting   Headache    magraines in the past   Neuropathy    feet mainly- hands some   Seasonal allergies    Past Surgical History:  Procedure Laterality Date   BREAST SURGERY Bilateral    Breast Enhancement   ESOPHAGOGASTRODUODENOSCOPY     x 3  last one approx 2002   KNEE SURGERY Right    arthroscopy- cartilage   ORIF ANKLE FRACTURE Left 04/18/2020   Procedure: OPEN REDUCTION INTERNAL FIXATION (ORIF) SYNDESMOSIS LEFT ANKLE AND LISFRANC JOINT LEFT FOOT;  Surgeon: Newt Minion, MD;  Location: [facility];  Service: Orthopedics;  Laterality: Left;   Allergies  Allergen Reactions   Bee Venom Anaphylaxis   Hornet Venom Anaphylaxis   Latex Hives and Shortness Of Breath   Prednisone Anaphylaxis and Rash   Chap-Aid [Chapstick] Swelling    Swelling of lips   Bextra [Valdecoxib] Rash   Flagyl [Metronidazole] Rash   Vioxx [Rofecoxib] Rash   Prior to Admission medications   Medication Sig Start Date End Date Taking? Authorizing Provider  ALPRAZolam (XANAX) 0.25 MG tablet Take 1 tablet (0.25 mg total) by mouth 2 (two) times daily as needed for anxiety. 07/06/20   Wendie Agreste, MD  clobetasol cream (TEMOVATE) 2.63 % Apply 1 application topically 2 (two) times daily. To affected lesions 07/04/20   Wendie Agreste, MD  COLLAGEN PO Take 15 g by mouth daily.    [provider]  Emollient (ROC RETINOL CORREXION) CREA Apply 1 application topically every other day.    [provider]  EPINEPHrine  0.3 mg/0.3 mL IJ SOAJ injection USE AS DIRECTED 12/04/20   Wendie Agreste, MD  escitalopram (LEXAPRO) 20 MG tablet Take 1 tablet (20 mg total) by mouth daily. 01/03/21   Wendie Agreste, MD  gabapentin (NEURONTIN) 300 MG capsule Take 1 capsule (300 mg total) by mouth at bedtime. Patient not taking: Reported on 01/03/2021 12/01/19   McCue,  Janett Billow, NP  Multiple Vitamins-Minerals (ADULT GUMMY PO) Take 2 capsules by mouth daily.     [provider]  mupirocin ointment (BACTROBAN) 2 % SMARTSIG:1 Application Topical 2-3 Times Daily 07/05/20   [provider]   Social History   Socioeconomic History   Marital status: Unknown    Spouse name: Not on file   Number of children: Not on file   Years of education: Not on file   Highest education level: Not on file  Occupational History   Not on file  Tobacco Use   Smoking status: Former   Smokeless tobacco: Never   Tobacco comments:    Apx 20 years plus light smoking social situations   Vaping Use   Vaping Use: Never used  Substance and Sexual Activity   Alcohol use: Yes    Alcohol/week: 1.0 standard drink    Types: 1 Glasses of wine per week    Comment: occcasionally   Drug use: No   Sexual activity: Not Currently  Other Topics Concern   Not on file  Social History Narrative   Not on file   Social Determinants of Health   Financial Resource Strain: Not on file  Food Insecurity: Not on file  Transportation Needs: Not on file  Physical Activity: Not on file  Stress: Not on file  Social Connections: Not on file  Intimate Partner Violence: Not on file    Review of Systems 13 point review of systems per patient health survey noted.  Negative other than as indicated above or in HPI.   Objective:   Vitals:   07/10/21 0802  BP: 132/76  Pulse: 79  Resp: 16  Temp: 98.4 F (36.9 C)  TempSrc: Temporal  SpO2: 98%  Weight: 201 lb (91.2 kg)  Height: '5\' 5"'  (1.651 m)     Physical Exam Constitutional:       Appearance: She is well-developed.  HENT:     Head: Normocephalic and atraumatic.     Right Ear: External ear normal.     Left Ear: External ear normal.  Eyes:     Conjunctiva/sclera: Conjunctivae normal.     Pupils: Pupils are equal, round, and reactive to light.  Neck:     Thyroid: No thyromegaly.  Cardiovascular:     Rate and Rhythm: Normal rate and regular rhythm.     Heart sounds: Normal heart sounds. No murmur heard. Pulmonary:     Effort: Pulmonary effort is normal. No respiratory distress.     Breath sounds: Normal breath sounds. No wheezing.  Abdominal:     General: Bowel sounds are normal.     Palpations: Abdomen is soft.     Tenderness: There is no abdominal tenderness.  Musculoskeletal:        General: No tenderness. Normal range of motion.     Cervical back: Normal range of motion and neck supple.  Lymphadenopathy:     Cervical: No cervical adenopathy.  Skin:    General: Skin is warm and dry.     Findings: Rash present.     Comments: Few very small papules behind right and left ear, lateral neck.  No vesicles.  No surrounding erythema.  Minimal excoriation.  No open wounds.  Neurological:     Mental Status: She is alert and oriented to person, place, and time.  Psychiatric:        Behavior: Behavior normal.        Thought Content: Thought content normal.     Assessment &  Plan:  Ann Sampson is a 46 y.o. female . Annual physical exam - Plan: CBC with Differential/Platelet, Comprehensive metabolic panel, Lipid panel, Hemoglobin A1c  - -anticipatory guidance as below in AVS, screening labs above. Health maintenance items as above in HPI discussed/recommended as applicable.   Hyperglycemia - Plan: Comprehensive metabolic panel, Hemoglobin A1c  -Return to exercise, weight loss discussed.  Check A1c as baseline, rule out prediabetes.  Hyperlipidemia, unspecified hyperlipidemia type - Plan: Comprehensive metabolic panel, Lipid panel  -No early family history  of cardiac disease known except for one grandfather who had morbid obesity.  Low ASCVD risk for previously.  Check labs, plan for diet/exercise approach.  Screening for deficiency anemia - Plan: CBC with Differential/Platelet  Generalized anxiety disorder - Plan: escitalopram (LEXAPRO) 20 MG tablet  -Stable, continue Lexapro.  Screening for colon cancer - Plan: Cologuard  Ear irritation, rash of neck Possible contact dermatitis versus combination of dry skin, irritation from mask ear loops.  Alternative attachments for mask with headband or connecting device behind ears discussed.  Hydrating lotion over-the-counter, trial of hydrocortisone initially as needed for itching with option of triamcinolone.  Can call for prescription.  Follow-up in the next 1 week if not improving.   Meds ordered this encounter  Medications   escitalopram (LEXAPRO) 20 MG tablet    Sig: Take 1 tablet (20 mg total) by mouth daily.    Dispense:  90 tablet    Refill:  1   Patient Instructions  Restart exercise as that was very helpful with weight loss in past. Goal of 161mn per week, but even short rides may be helpful.   Itching/irritation behind ears and that could be a combination of dry skin and irritation from the loops of the mask.  Can try different mask or device to pull the ear loops away from behind the ears.  Over-the-counter Eucerin, Aveeno, or Cetaphil lotion to neck and behind the ears for possible dry skin.  Hydrocortisone cream over-the-counter up to 2-3 times per day at the most if needed for itching.  Follow-up in the next 1 week if not improving, sooner if worse.  No change in Lexapro at this time.  I will let you know if there are concerns on labs.  Thanks for coming in today.   We recommend that you schedule a mammogram for breast cancer screening. Typically, you do not need a referral to do this. Please contact a local imaging center to schedule your mammogram.  [facility] ([phone] *ask for the Radiology [place] - ([phone] ([phone] [facility] - ([phone] - [phone] *[facility] ([phone] *ask for the Radiology Department [facility] - ([phone] *ask for the [place]- ([phone]  Preventive Care [id number] Old, Female Preventive care refers to lifestyle choices and visits with your health care provider that can promote health and wellness. Preventive care visits are also called wellness exams. What can I expect for my preventive care visit? Counseling Your health care provider may ask you questions about your: Medical history, including: Past medical problems. Family medical history. Pregnancy history. Current health, including: Menstrual cycle. Method of birth control. Emotional well-being. Home life and relationship well-being. Sexual activity and sexual health. Lifestyle, including: Alcohol, nicotine or tobacco, and drug use.  Access to firearms. Diet, exercise, and sleep habits. Work and work Statistician. Sunscreen use. Safety issues such as seatbelt and bike helmet use. Physical exam Your health care provider will check your: Height and weight. These may be used to calculate your BMI (body mass index). BMI is a measurement that tells if you are at a healthy weight. Waist circumference. This measures the distance around your waistline. This measurement also tells if you are at a healthy weight and may help predict your risk of certain diseases, such as type 2 diabetes and high blood pressure. Heart rate and blood pressure. Body temperature. Skin for abnormal spots. What immunizations do I need? Vaccines are usually given at various ages, according to a schedule. Your health care provider will  recommend vaccines for you based on your age, medical history, and lifestyle or other factors, such as travel or where you work. What tests do I need? Screening Your health care provider may recommend screening tests for certain conditions. This may include: Lipid and cholesterol levels. Diabetes screening. This is done by checking your blood sugar (glucose) after you have not eaten for a while (fasting). Pelvic exam and Pap test. Hepatitis B test. Hepatitis C test. HIV (human immunodeficiency virus) test. STI (sexually transmitted infection) testing, if you are at risk. Lung cancer screening. Colorectal cancer screening. Mammogram. Talk with your health care provider about when you should start having regular mammograms. This may depend on whether you have a family history of breast cancer. BRCA-related cancer screening. This may be done if you have a family history of breast, ovarian, tubal, or peritoneal cancers. Bone density scan. This is done to screen for osteoporosis. Talk with your health care provider about your test results, treatment options, and if necessary, the need for more tests. Follow these instructions at home: Eating and drinking  Eat a diet that includes fresh fruits and vegetables, whole grains, lean protein, and low-fat dairy products. Take vitamin and mineral supplements as recommended by your health care provider. Do not drink alcohol if: Your health care provider tells you not to drink. You are pregnant, may be pregnant, or are planning to become pregnant. If you drink alcohol: Limit how much you have to 0-1 drink a day. Know how much alcohol is in your drink. In the [place] one drink equals one 12 oz bottle of beer (355 mL), one 5 oz glass of wine (148 mL), or one 1 oz glass of hard liquor (44 mL). Lifestyle Brush your teeth every morning and night with fluoride toothpaste. Floss one time each day. Exercise for at least 30 minutes 5 or more days each week. Do  not use any products that contain nicotine or tobacco. These products include cigarettes, chewing tobacco, and vaping devices, such as e-cigarettes. If you need help quitting, ask your health care provider. Do not use drugs. If you are sexually active, practice safe sex. Use a condom or other form of protection to prevent STIs. If you do not wish to become pregnant, use a form of birth control. If you plan to become pregnant, see your health care provider for a prepregnancy visit. Take aspirin only as told by your health care provider. Make sure that you understand how much to take and what form to take. Work with your health care provider to find out whether it is safe and beneficial for you to take aspirin daily. Find healthy ways to manage stress, such as: Meditation, yoga, or listening to music. Journaling. Talking to a  trusted person. Spending time with friends and family. Minimize exposure to UV radiation to reduce your risk of skin cancer. Safety Always wear your seat belt while driving or riding in a vehicle. Do not drive: If you have been drinking alcohol. Do not ride with someone who has been drinking. When you are tired or distracted. While texting. If you have been using any mind-altering substances or drugs. Wear a helmet and other protective equipment during sports activities. If you have firearms in your house, make sure you follow all gun safety procedures. Seek help if you have been physically or sexually abused. What's next? Visit your health care provider once a year for an annual wellness visit. Ask your health care provider how often you should have your eyes and teeth checked. Stay up to date on all vaccines. This information is not intended to replace advice given to you by your health care provider. Make sure you discuss any questions you have with your health care provider. Document Revised: 12/12/2020 Document Reviewed: 12/12/2020 Elsevier Patient Education  2022  [place],   Merri Ray, MD [place] 07/10/21 9:23 AM

## 2021-07-10 NOTE — Patient Instructions (Addendum)
Restart exercise as that was very helpful with weight loss in past. Goal of 167mn per week, but even short rides may be helpful.   Itching/irritation behind ears and that could be a combination of dry skin and irritation from the loops of the mask.  Can try different mask or device to pull the ear loops away from behind the ears.  Over-the-counter Eucerin, Aveeno, or Cetaphil lotion to neck and behind the ears for possible dry skin.  Hydrocortisone cream over-the-counter up to 2-3 times per day at the most if needed for itching.  Follow-up in the next 1 week if not improving, sooner if worse.  No change in Lexapro at this time.  I will let you know if there are concerns on labs.  Thanks for coming in today.   We recommend that you schedule a mammogram for breast cancer screening. Typically, you do not need a referral to do this. Please contact a local imaging center to schedule your mammogram.  [facility] ([phone] *ask for the Radiology [place] - [phone] ([phone] [facility] - [phone]([phone] - [phone] *[facility] ([phone] *ask for the Radiology Department [facility] - ([phone] *ask for the MP[place]- [phone]  Preventive Care [id number] Old, Female Preventive care refers to lifestyle choices and visits with your health care provider that can promote health and wellness. Preventive care visits are also called wellness exams. What can I expect for my preventive care visit? Counseling Your health care provider may ask you questions about your: Medical history, including: Past medical problems. Family medical history. Pregnancy history. Current health, including: Menstrual cycle. Method of birth control. Emotional  well-being. Home life and relationship well-being. Sexual activity and sexual health. Lifestyle, including: Alcohol, nicotine or tobacco, and drug use. Access to firearms. Diet, exercise, and sleep habits. Work and work eStatistician Sunscreen use. Safety issues such as seatbelt and bike helmet use. Physical exam Your health care provider will check your: Height and weight. These may be used to calculate your BMI (body mass index). BMI is a measurement that tells if you are at a healthy weight. Waist circumference. This measures the distance around your waistline. This measurement also tells if you are at a healthy weight and may help predict your risk of certain diseases, such as type 2 diabetes and high blood pressure. Heart rate and blood pressure. Body temperature. Skin for abnormal spots. What immunizations do I need? Vaccines are usually given at various ages, according to a schedule. Your health care provider will recommend vaccines for you based on your age, medical history, and lifestyle or other factors, such as travel or where you work. What tests do I need? Screening Your health care provider may recommend screening tests for certain conditions. This may include: Lipid and cholesterol levels. Diabetes screening. This is done by checking your blood sugar (glucose) after you have not eaten for a while (fasting). Pelvic exam and Pap test. Hepatitis B test. Hepatitis C test. HIV (human immunodeficiency virus) test. STI (sexually transmitted infection) testing, if you are at risk. Lung cancer screening. Colorectal cancer screening. Mammogram. Talk with your health care provider about when you should start having regular mammograms. This may depend on whether you have a family history of breast cancer. BRCA-related cancer  screening. This may be done if you have a family history of breast, ovarian, tubal, or peritoneal cancers. Bone density scan. This is done to screen for  osteoporosis. Talk with your health care provider about your test results, treatment options, and if necessary, the need for more tests. Follow these instructions at home: Eating and drinking  Eat a diet that includes fresh fruits and vegetables, whole grains, lean protein, and low-fat dairy products. Take vitamin and mineral supplements as recommended by your health care provider. Do not drink alcohol if: Your health care provider tells you not to drink. You are pregnant, may be pregnant, or are planning to become pregnant. If you drink alcohol: Limit how much you have to 0-1 drink a day. Know how much alcohol is in your drink. In the [place] one drink equals one 12 oz bottle of beer (355 mL), one 5 oz glass of wine (148 mL), or one 1 oz glass of hard liquor (44 mL). Lifestyle Brush your teeth every morning and night with fluoride toothpaste. Floss one time each day. Exercise for at least 30 minutes 5 or more days each week. Do not use any products that contain nicotine or tobacco. These products include cigarettes, chewing tobacco, and vaping devices, such as e-cigarettes. If you need help quitting, ask your health care provider. Do not use drugs. If you are sexually active, practice safe sex. Use a condom or other form of protection to prevent STIs. If you do not wish to become pregnant, use a form of birth control. If you plan to become pregnant, see your health care provider for a prepregnancy visit. Take aspirin only as told by your health care provider. Make sure that you understand how much to take and what form to take. Work with your health care provider to find out whether it is safe and beneficial for you to take aspirin daily. Find healthy ways to manage stress, such as: Meditation, yoga, or listening to music. Journaling. Talking to a trusted person. Spending time with friends and family. Minimize exposure to UV radiation to reduce your risk of skin cancer. Safety Always wear  your seat belt while driving or riding in a vehicle. Do not drive: If you have been drinking alcohol. Do not ride with someone who has been drinking. When you are tired or distracted. While texting. If you have been using any mind-altering substances or drugs. Wear a helmet and other protective equipment during sports activities. If you have firearms in your house, make sure you follow all gun safety procedures. Seek help if you have been physically or sexually abused. What's next? Visit your health care provider once a year for an annual wellness visit. Ask your health care provider how often you should have your eyes and teeth checked. Stay up to date on all vaccines. This information is not intended to replace advice given to you by your health care provider. Make sure you discuss any questions you have with your health care provider. Document Revised: 12/12/2020 Document Reviewed: 12/12/2020 Elsevier Patient Education  [place].

## 2021-09-03 ENCOUNTER — Other Ambulatory Visit: Payer: Self-pay

## 2021-09-03 ENCOUNTER — Encounter (HOSPITAL_COMMUNITY): Payer: Self-pay

## 2021-09-03 ENCOUNTER — Ambulatory Visit (INDEPENDENT_AMBULATORY_CARE_PROVIDER_SITE_OTHER): Payer: 59

## 2021-09-03 ENCOUNTER — Ambulatory Visit (HOSPITAL_COMMUNITY)
Admission: EM | Admit: 2021-09-03 | Discharge: 2021-09-03 | Disposition: A | Discharge status: Home / Self Care | Payer: 59 | Attending: Nurse Practitioner | Admitting: Nurse Practitioner

## 2021-09-03 DIAGNOSIS — R071 Chest pain on breathing: Secondary | ICD-10-CM

## 2021-09-03 DIAGNOSIS — R059 Cough, unspecified: Secondary | ICD-10-CM | POA: Diagnosis not present

## 2021-09-03 DIAGNOSIS — M546 Pain in thoracic spine: Secondary | ICD-10-CM

## 2021-09-03 MED ORDER — TIZANIDINE HCL 4 MG PO TABS: 4.0000 mg | ORAL_TABLET | Freq: Four times a day (QID) | ORAL | 0 refills | Status: DC | PRN

## 2021-09-03 NOTE — Discharge Instructions (Signed)
The chest x-ray today was negative.  Please start Tylenol/ibuprofen and you can also try warm compresses or ice packs to help with the pain.  If none of this helps, you can try the tizanidine to help with the muscle pain.  Please follow up with your primary care provider if none of these tactics help. ?

## 2021-09-03 NOTE — ED Provider Notes (Signed)
[facility]    CSN: [id number] Arrival date & time: 09/03/21  0830      History   Chief Complaint Chief Complaint  Patient presents with   Back Pain    HPI Ann Sampson is a 46 y.o. female.   Patient reports upper mid back pain in between shoulder blades x weeks.  She reports prior to this starting, she was sick with upper respiratory symptoms for a couple of weeks but this has since resolved.  She reports the pain is worse at night time and she is having some pain when she takes a deep breath in and it feels like she cannot take a deep breath.  She denies fevers and any current upper respiratory symptoms.  She has not taken any medication for the pain.      Past Medical History:  Diagnosis Date   Anxiety    Arthritis    Asthma    Complication of anesthesia    with endoscopic spoke profeaty   Depression    Family history of adverse reaction to anesthesia    sister- vommitting   Headache    magraines in the past   Neuropathy    feet mainly- hands some   Seasonal allergies     Patient Active Problem List   Diagnosis Date Noted   Lisfranc dislocation, left, sequela    Ankle syndesmosis disruption, left, sequela    Generalized anxiety disorder 11/03/2017   Neuropathy 11/04/2016   Paresthesias 11/04/2016    Past Surgical History:  Procedure Laterality Date   BREAST SURGERY Bilateral    Breast Enhancement   ESOPHAGOGASTRODUODENOSCOPY     x 3  last one approx 2002   KNEE SURGERY Right    arthroscopy- cartilage   ORIF ANKLE FRACTURE Left 04/18/2020   Procedure: OPEN REDUCTION INTERNAL FIXATION (ORIF) SYNDESMOSIS LEFT ANKLE AND LISFRANC JOINT LEFT FOOT;  Surgeon: Nadara Mustard, MD;  Location: [facility];  Service: Orthopedics;  Laterality: Left;    OB History   No obstetric history on file.      Home Medications    Prior to Admission medications   Medication Sig Start Date End Date Taking? Authorizing Provider  tiZANidine (ZANAFLEX) 4 MG  tablet Take 1 tablet (4 mg total) by mouth every 6 (six) hours as needed for muscle spasms. 09/03/21  Yes Valentino Nose, NP  ALPRAZolam Prudy Feeler) 0.25 MG tablet Take 1 tablet (0.25 mg total) by mouth 2 (two) times daily as needed for anxiety. 07/06/20   Shade Flood, MD  clobetasol cream (TEMOVATE) 0.05 % Apply 1 application topically 2 (two) times daily. To affected lesions 07/04/20   Shade Flood, MD  COLLAGEN PO Take 15 g by mouth daily.    [provider]  Emollient (ROC RETINOL CORREXION) CREA Apply 1 application topically every other day.    [provider]  EPINEPHrine 0.3 mg/0.3 mL IJ SOAJ injection USE AS DIRECTED 12/04/20   Shade Flood, MD  escitalopram (LEXAPRO) 20 MG tablet Take 1 tablet (20 mg total) by mouth daily. 07/10/21   Shade Flood, MD  gabapentin (NEURONTIN) 300 MG capsule Take 1 capsule (300 mg total) by mouth at bedtime. Patient not taking: Reported on 01/03/2021 12/01/19   Ihor Austin, NP  Multiple Vitamins-Minerals (ADULT GUMMY PO) Take 2 capsules by mouth daily.     [provider]  mupirocin ointment (BACTROBAN) 2 % SMARTSIG:1 Application Topical 2-3 Times Daily 07/05/20   [provider]  Family History Family History  Problem Relation Age of Onset   Skin cancer Mother    Asthma Son    Seizures Maternal Grandmother    Cancer Maternal Grandmother    Stroke Maternal Grandmother    Neuropathy Maternal Grandfather    Diabetic kidney disease Maternal Grandfather    Diabetes Maternal Grandfather    Heart disease Paternal Grandfather    Hyperlipidemia Paternal Grandfather    Hypertension Paternal Grandfather     Social History Social History   Tobacco Use   Smoking status: Former   Smokeless tobacco: Never   Tobacco comments:    Apx 20 years plus light smoking social situations   Vaping Use   Vaping Use: Never used  Substance Use Topics   Alcohol use: Yes    Alcohol/week: 1.0 standard drink    Types: 1  Glasses of wine per week    Comment: occcasionally   Drug use: No     Allergies   Bee venom, Hornet venom, Latex, Prednisone, Chap-aid [chapstick], Bextra [valdecoxib], Flagyl [metronidazole], and Vioxx [rofecoxib]   Review of Systems Review of Systems Per HPI  Physical Exam Triage Vital Signs ED Triage Vitals [09/03/21 0943]  Enc Vitals Group     BP (!) 149/81     Pulse Rate 84     Resp 18     Temp 99.1 F (37.3 C)     Temp Source Oral     SpO2 97 %     Weight      Height      Head Circumference      Peak Flow      Pain Score 6     Pain Loc      Pain Edu?      Excl. in GC?    No data found.  Updated Vital Signs BP (!) 149/81 (BP Location: Left Arm)    Pulse 84    Temp 99.1 F (37.3 C) (Oral)    Resp 18    LMP 08/14/2021    SpO2 97%   Visual Acuity Right Eye Distance:   Left Eye Distance:   Bilateral Distance:    Right Eye Near:   Left Eye Near:    Bilateral Near:     Physical Exam Vitals and nursing note reviewed.  Constitutional:      General: She is not in acute distress.    Appearance: Normal appearance. She is not toxic-appearing.  HENT:     Head: Normocephalic and atraumatic.     Right Ear: Tympanic membrane, ear canal and external ear normal.     Left Ear: Tympanic membrane, ear canal and external ear normal.     Nose: Nose normal. No congestion.     Mouth/Throat:     Mouth: Mucous membranes are moist.     Pharynx: Oropharynx is clear. No posterior oropharyngeal erythema.  Eyes:     General: No scleral icterus.    Extraocular Movements: Extraocular movements intact.  Cardiovascular:     Rate and Rhythm: Normal rate and regular rhythm.     Heart sounds: Normal heart sounds. No murmur heard. Pulmonary:     Effort: Pulmonary effort is normal. No respiratory distress.     Breath sounds: Decreased air movement present.  Musculoskeletal:       Arms:     Cervical back: Normal range of motion.     Comments: Patient reports pain to the area  marked; nontender to palpation and examination unrevealing for rash, mass, bruising,  erythema  Lymphadenopathy:     Cervical: No cervical adenopathy.  Skin:    General: Skin is warm and dry.     Capillary Refill: Capillary refill takes less than 2 seconds.     Coloration: Skin is not jaundiced or pale.     Findings: No erythema.  Neurological:     Mental Status: She is alert and oriented to person, place, and time.     Motor: No weakness.     Gait: Gait normal.  Psychiatric:        Mood and Affect: Mood normal.        Behavior: Behavior normal.        Thought Content: Thought content normal.        Judgment: Judgment normal.     UC Treatments / Results  Labs (all labs ordered are listed, but only abnormal results are displayed) Labs Reviewed - No data to display  EKG   Radiology DG Chest 2 View  Result Date: 09/03/2021 CLINICAL DATA:  Pain with inspiration.  Cough for 2 weeks EXAM: CHEST - 2 VIEW COMPARISON:  10/02/2017 FINDINGS: Normal heart size and mediastinal contours. No acute infiltrate or edema. No effusion or pneumothorax. No acute osseous findings. IMPRESSION: Negative chest. Electronically Signed   By: Tiburcio Pea M.D.   On: 09/03/2021 10:20    Procedures Procedures (including critical care time)  Medications Ordered in UC Medications - No data to display  Initial Impression / Assessment and Plan / UC Course  I have reviewed the triage vital signs and the nursing notes.  Pertinent labs & imaging results that were available during my care of the patient were reviewed by me and considered in my medical decision making (see chart for details).    Chest x-ray today does not show acute lung findings.  Symptoms consistent with costochondritis vs. Muscle strain.  Start Tylenol/ibuprofen as needed for pain; if unrelieved, she can try muscle relaxant.  Start light stretching/physical activity.  Follow up with PCP if symptoms persist despite treatment.    Final  Clinical Impressions(s) / UC Diagnoses   Final diagnoses:  Acute midline thoracic back pain     Discharge Instructions      The chest x-ray today was negative.  Please start Tylenol/ibuprofen and you can also try warm compresses or ice packs to help with the pain.  If none of this helps, you can try the tizanidine to help with the muscle pain.  Please follow up with your primary care provider if none of these tactics help.     ED Prescriptions     Medication Sig Dispense Auth. Provider   tiZANidine (ZANAFLEX) 4 MG tablet Take 1 tablet (4 mg total) by mouth every 6 (six) hours as needed for muscle spasms. 30 tablet Valentino Nose, NP      PDMP not reviewed this encounter.   Valentino Nose, NP 09/03/21 1034

## 2021-09-03 NOTE — ED Triage Notes (Signed)
Pt c/o upper center back pain since last night and worse this morning. States worse on certain movements and takes her breath away. Denies taking any meds. ?

## 2021-09-18 ENCOUNTER — Encounter: Payer: Self-pay | Admitting: Family Medicine

## 2021-09-18 DIAGNOSIS — E785 Hyperlipidemia, unspecified: Secondary | ICD-10-CM

## 2021-09-23 MED ORDER — ATORVASTATIN CALCIUM 10 MG PO TABS: 10.0000 mg | ORAL_TABLET | Freq: Every day | ORAL | 0 refills | Status: DC

## 2021-10-23 ENCOUNTER — Other Ambulatory Visit: Payer: Self-pay | Admitting: Family Medicine

## 2021-10-28 ENCOUNTER — Other Ambulatory Visit: Payer: Self-pay | Admitting: Family Medicine

## 2021-10-28 DIAGNOSIS — Z1231 Encounter for screening mammogram for malignant neoplasm of breast: Secondary | ICD-10-CM

## 2021-10-30 ENCOUNTER — Ambulatory Visit
Admission: RE | Admit: 2021-10-30 | Discharge: 2021-10-30 | Disposition: A | Discharge status: Home / Self Care | Payer: 59 | Source: Ambulatory Visit | Attending: Family Medicine | Admitting: Family Medicine

## 2021-10-30 DIAGNOSIS — Z1231 Encounter for screening mammogram for malignant neoplasm of breast: Secondary | ICD-10-CM

## 2021-11-01 ENCOUNTER — Other Ambulatory Visit: Payer: Self-pay | Admitting: Family Medicine

## 2021-11-01 ENCOUNTER — Other Ambulatory Visit: Payer: Self-pay | Admitting: Critical Care Medicine

## 2021-11-01 ENCOUNTER — Other Ambulatory Visit: Payer: Self-pay | Admitting: Physician Assistant

## 2021-11-01 DIAGNOSIS — R928 Other abnormal and inconclusive findings on diagnostic imaging of breast: Secondary | ICD-10-CM

## 2021-11-11 ENCOUNTER — Ambulatory Visit
Admission: RE | Admit: 2021-11-11 | Discharge: 2021-11-11 | Disposition: A | Discharge status: Home / Self Care | Payer: 59 | Source: Ambulatory Visit | Attending: Family Medicine | Admitting: Family Medicine

## 2021-11-11 ENCOUNTER — Other Ambulatory Visit: Payer: Self-pay | Admitting: Family Medicine

## 2021-11-11 DIAGNOSIS — R928 Other abnormal and inconclusive findings on diagnostic imaging of breast: Secondary | ICD-10-CM

## 2021-11-11 DIAGNOSIS — N6489 Other specified disorders of breast: Secondary | ICD-10-CM

## 2022-01-08 ENCOUNTER — Encounter: Payer: Self-pay | Admitting: Family Medicine

## 2022-01-08 ENCOUNTER — Ambulatory Visit: Payer: 59 | Admitting: Family Medicine

## 2022-01-08 VITALS — BP 124/80 | HR 74 | Temp 98.0°F | Resp 17 | Ht 65.0 in | Wt 202.1 lb

## 2022-01-08 DIAGNOSIS — F411 Generalized anxiety disorder: Secondary | ICD-10-CM

## 2022-01-08 DIAGNOSIS — R0683 Snoring: Secondary | ICD-10-CM | POA: Diagnosis not present

## 2022-01-08 DIAGNOSIS — E785 Hyperlipidemia, unspecified: Secondary | ICD-10-CM | POA: Diagnosis not present

## 2022-01-08 DIAGNOSIS — G4700 Insomnia, unspecified: Secondary | ICD-10-CM | POA: Diagnosis not present

## 2022-01-08 LAB — LIPID PANEL
Cholesterol: 293 mg/dL — ABNORMAL HIGH (ref 0–200)
HDL: 70.8 mg/dL (ref >39.00)
LDL Cholesterol: 195 mg/dL — ABNORMAL HIGH (ref 0–99)
NonHDL: 222.05
Total CHOL/HDL Ratio: 4
Triglycerides: 134 mg/dL (ref 0.0–149.0)
VLDL: 26.8 mg/dL (ref 0.0–40.0)

## 2022-01-08 LAB — COMPREHENSIVE METABOLIC PANEL
ALT: 16 U/L (ref 0–35)
AST: 23 U/L (ref 0–37)
Albumin: 4.8 g/dL (ref 3.5–5.2)
Alkaline Phosphatase: 57 U/L (ref 39–117)
BUN: 16 mg/dL (ref 6–23)
CO2: 30 mEq/L (ref 19–32)
Calcium: 9.7 mg/dL (ref 8.4–10.5)
Chloride: 97 mEq/L (ref 96–112)
Creatinine, Ser: 0.81 mg/dL (ref 0.40–1.20)
GFR: 86.94 mL/min (ref >60.00)
Glucose, Bld: 94 mg/dL (ref 70–99)
Potassium: 4.2 mEq/L (ref 3.5–5.1)
Sodium: 135 mEq/L (ref 135–145)
Total Bilirubin: 0.3 mg/dL (ref 0.2–1.2)
Total Protein: 7.5 g/dL (ref 6.0–8.3)

## 2022-01-08 MED ORDER — ATORVASTATIN CALCIUM 10 MG PO TABS: 10.0000 mg | ORAL_TABLET | Freq: Every day | ORAL | 1 refills | Status: DC

## 2022-01-08 MED ORDER — ESCITALOPRAM OXALATE 20 MG PO TABS: 20.0000 mg | ORAL_TABLET | Freq: Every day | ORAL | 1 refills | Status: DC

## 2022-01-08 NOTE — Progress Notes (Signed)
Subjective:  Patient ID: Ann Sampson, female    DOB: 1976/06/22  Age: 45 y.o. MRN: [id number]  CC:  Chief Complaint  Patient presents with   Follow-up    Follow up on anxiety per pt she states she is really unsure of the reason for her apt today  Anxiety is improving     HPI Ann Sampson presents for   Generalized anxiety disorder Treated with Lexapro 20 mg daily.  Last discussed in January with some situational stressors, alprazolam for breakthrough symptoms only as needed, minimal use. Anxiety is doing better. Added Wellbutrin BID has been helpful and with food cravings.  Wakes up during the night - 2-6 times, no dyspnea, but some snoring, and wakes up from snoring. Prior gabapentin for neuropathy, off meds without recurrence. No otc treatments.      01/08/2022   10:44 AM 07/10/2021    8:28 AM 01/03/2021    2:25 PM 07/06/2020   10:14 AM 07/04/2020   10:28 AM  Depression screen PHQ 2/9  Decreased Interest 0 0 0 0 0  Down, Depressed, Hopeless 0 0 0 0 0  PHQ - 2 Score 0 0 0 0 0  Altered sleeping 2 1 1     Tired, decreased energy 0 2 1    Change in appetite 0 0 1    Feeling bad or failure about yourself  0 0 0    Trouble concentrating 0 0 1    Moving slowly or fidgety/restless 0 0 0    Suicidal thoughts 0 0 0    PHQ-9 Score 2 3 4     Difficult doing work/chores Not difficult at all          01/08/2022   10:44 AM 07/10/2021    9:06 AM 01/03/2021    2:26 PM 02/15/2020    3:13 PM  GAD 7 : Generalized Anxiety Score  Nervous, Anxious, on Edge 1 1 3 3   Control/stop worrying 1 0 1 2  Worry too much - different things 2 1 1 2   Trouble relaxing 1 0 1 1  Restless 1 1 2 1   Easily annoyed or irritable 0 0 1 0  Afraid - awful might happen 1 0 0 0  Total GAD 7 Score 7 3 9 9   Anxiety Difficulty Somewhat difficult      History of hyperglycemia with normal A1c in January.  Lost a few pounds then regained.  Lab Results  Component Value Date   HGBA1C 5.4 07/10/2021   Wt  Readings from Last 3 Encounters:  01/08/22 202 lb 2 oz (91.7 kg)  07/10/21 201 lb (91.2 kg)  01/03/21 174 lb 9.6 oz (79.2 kg)   Hyperlipidemia: Lipitor started in January. Taking 4 days per week. No myalgias or side effects. Fasting today.  Lab Results  Component Value Date   CHOL 281 (H) 07/10/2021   HDL 60.40 07/10/2021   LDLCALC 197 (H) 07/10/2021   TRIG 119.0 07/10/2021   CHOLHDL 5 07/10/2021   Lab Results  Component Value Date   ALT 14 07/10/2021   AST 17 07/10/2021   ALKPHOS 43 07/10/2021   BILITOT 0.4 07/10/2021     History Patient Active Problem List   Diagnosis Date Noted   Lisfranc dislocation, left, sequela    Ankle syndesmosis disruption, left, sequela    Generalized anxiety disorder 11/03/2017   Neuropathy 11/04/2016   Paresthesias 11/04/2016   Past Medical History:  Diagnosis Date   Anxiety    Arthritis  Asthma    Complication of anesthesia    with endoscopic spoke profeaty   Depression    Family history of adverse reaction to anesthesia    sister- vommitting   Headache    magraines in the past   Neuropathy    feet mainly- hands some   Seasonal allergies    Past Surgical History:  Procedure Laterality Date   AUGMENTATION MAMMAPLASTY Bilateral    2006   BREAST SURGERY Bilateral    Breast Enhancement   ESOPHAGOGASTRODUODENOSCOPY     x 3  last one approx 2002   KNEE SURGERY Right    arthroscopy- cartilage   ORIF ANKLE FRACTURE Left 04/18/2020   Procedure: OPEN REDUCTION INTERNAL FIXATION (ORIF) SYNDESMOSIS LEFT ANKLE AND LISFRANC JOINT LEFT FOOT;  Surgeon: Nadara Mustard, MD;  Location: [facility];  Service: Orthopedics;  Laterality: Left;   Allergies  Allergen Reactions   Bee Venom Anaphylaxis   Hornet Venom Anaphylaxis   Latex Hives and Shortness Of Breath   Prednisone Anaphylaxis, Rash and Shortness Of Breath   Chap-Aid [Chapstick] Swelling    Swelling of lips   Bextra [Valdecoxib] Rash   Flagyl [Metronidazole] Rash   Vioxx  [Rofecoxib] Rash   Prior to Admission medications   Medication Sig Start Date End Date Taking? Authorizing Provider  atorvastatin (LIPITOR) 10 MG tablet Take 1 tablet (10 mg total) by mouth daily. 09/23/21  Yes Shade Flood, MD  buPROPion (WELLBUTRIN SR) 150 MG 12 hr tablet TAKE ONE TABLET BY MOUTH TWICE DAILY. 10/25/21  Yes Shade Flood, MD  clobetasol cream (TEMOVATE) 0.05 % Apply 1 application topically 2 (two) times daily. To affected lesions 07/04/20  Yes Shade Flood, MD  EPINEPHrine 0.3 mg/0.3 mL IJ SOAJ injection USE AS DIRECTED 12/04/20  Yes Shade Flood, MD  escitalopram (LEXAPRO) 20 MG tablet Take 1 tablet (20 mg total) by mouth daily. 07/10/21  Yes Shade Flood, MD  Multiple Vitamins-Minerals (ADULT GUMMY PO) Take 2 capsules by mouth daily.    Yes [provider]  naltrexone (DEPADE) 50 MG tablet Take 25 mg by mouth daily. 12/21/21  Yes [provider]  triamcinolone cream (KENALOG) 0.1 % Apply 30 g topically 2 (two) times daily. 11/14/21  Yes [provider]  ALPRAZolam (XANAX) 0.25 MG tablet Take 1 tablet (0.25 mg total) by mouth 2 (two) times daily as needed for anxiety. Patient not taking: Reported on 01/08/2022 07/06/20   Shade Flood, MD  COLLAGEN PO Take 15 g by mouth daily. Patient not taking: Reported on 01/08/2022    [provider]  Emollient (ROC RETINOL CORREXION) CREA Apply 1 application topically every other day. Patient not taking: Reported on 01/08/2022    [provider]  gabapentin (NEURONTIN) 300 MG capsule Take 1 capsule (300 mg total) by mouth at bedtime. Patient not taking: Reported on 01/03/2021 12/01/19   Ihor Austin, NP  mupirocin ointment (BACTROBAN) 2 % SMARTSIG:1 Application Topical 2-3 Times Daily Patient not taking: Reported on 01/08/2022 07/05/20   [provider]  tiZANidine (ZANAFLEX) 4 MG tablet Take 1 tablet (4 mg total) by mouth every 6 (six) hours as needed for muscle  spasms. Patient not taking: Reported on 01/08/2022 09/03/21   Valentino Nose, NP   Social History   Socioeconomic History   Marital status: Unknown    Spouse name: Not on file   Number of children: Not on file   Years of education: Not on file  Highest education level: Not on file  Occupational History   Not on file  Tobacco Use   Smoking status: Former   Smokeless tobacco: Never   Tobacco comments:    Apx 20 years plus light smoking social situations   Vaping Use   Vaping Use: Never used  Substance and Sexual Activity   Alcohol use: Yes    Alcohol/week: 1.0 standard drink of alcohol    Types: 1 Glasses of wine per week    Comment: occcasionally   Drug use: No   Sexual activity: Not Currently    Birth control/protection: None  Other Topics Concern   Not on file  Social History Narrative   Not on file   Social Determinants of Health   Financial Resource Strain: Not on file  Food Insecurity: Not on file  Transportation Needs: Not on file  Physical Activity: Not on file  Stress: Not on file  Social Connections: Not on file  Intimate Partner Violence: Not on file    Review of Systems  Per HPI.. Objective:   Vitals:   01/08/22 1045  BP: 124/80  Pulse: 74  Resp: 17  Temp: 98 F (36.7 C)  TempSrc: Temporal  SpO2: 98%  Weight: 202 lb 2 oz (91.7 kg)  Height: 5\' 5"  (1.651 m)     Physical Exam Vitals reviewed.  Constitutional:      Appearance: Normal appearance. She is well-developed.  HENT:     Head: Normocephalic and atraumatic.  Eyes:     Conjunctiva/sclera: Conjunctivae normal.     Pupils: Pupils are equal, round, and reactive to light.  Neck:     Vascular: No carotid bruit.  Cardiovascular:     Rate and Rhythm: Normal rate and regular rhythm.     Heart sounds: Normal heart sounds.  Pulmonary:     Effort: Pulmonary effort is normal.     Breath sounds: Normal breath sounds.  Abdominal:     Palpations: Abdomen is soft. There is no pulsatile  mass.     Tenderness: There is no abdominal tenderness.  Musculoskeletal:     Right lower leg: No edema.     Left lower leg: No edema.  Skin:    General: Skin is warm and dry.  Neurological:     Mental Status: She is alert and oriented to person, place, and time.  Psychiatric:        Mood and Affect: Mood normal.        Behavior: Behavior normal.        Thought Content: Thought content normal.        Assessment & Plan:  Ann CalamityRhonda M Dimare is a 46 y.o. female . Hyperlipidemia, unspecified hyperlipidemia type - Plan: Comprehensive metabolic panel, Lipid panel  -Tolerating Lipitor at 4 times per week dosing, check updated labs, consider daily dosing or higher dose depending on reading.  Generalized anxiety disorder  -Stable with Lexapro, continue same.  Also on Wellbutrin which has been helpful.  No recent need of Xanax.  Insomnia, unspecified type Frequent nocturnal awakening Snoring  -Could be combined picture of anxiety/psychophysiologic insomnia versus sleep apnea with snoring and nocturnal awakening.  Recommended sleep specialist evaluation and likely home sleep test but deferred for now.  Trial of melatonin as an option and handout given on insomnia.  No orders of the defined types were placed in this encounter.  Patient Instructions   Try melatonin over the counter and see info on sleep below. I would recommend meeting with a  sleep specialist and home sleep testing - let me know when I can place that referral.   No other med changes today. Depending on cholesterol reading, I may recommend more doses per week of the Lipitor. I will let you know. Take care!  Insomnia Insomnia is a sleep disorder that makes it difficult to fall asleep or stay asleep. Insomnia can cause fatigue, low energy, difficulty concentrating, mood swings, and poor performance at work or school. There are three different ways to classify insomnia: Difficulty falling asleep. Difficulty staying  asleep. Waking up too early in the morning. Any type of insomnia can be long-term (chronic) or short-term (acute). Both are common. Short-term insomnia usually lasts for 3 months or less. Chronic insomnia occurs at least three times a week for longer than 3 months. What are the causes? Insomnia may be caused by another condition, situation, or substance, such as: Having certain mental health conditions, such as anxiety and depression. Using caffeine, alcohol, tobacco, or drugs. Having gastrointestinal conditions, such as gastroesophageal reflux disease (GERD). Having certain medical conditions. These include: Asthma. Alzheimer's disease. Stroke. Chronic pain. An overactive thyroid gland (hyperthyroidism). Other sleep disorders, such as restless legs syndrome and sleep apnea. Menopause. Sometimes, the cause of insomnia may not be known. What increases the risk? Risk factors for insomnia include: Gender. Females are affected more often than males. Age. Insomnia is more common as people get older. Stress and certain medical and mental health conditions. Lack of exercise. Having an irregular work schedule. This may include working night shifts and traveling between different time zones. What are the signs or symptoms? If you have insomnia, the main symptom is having trouble falling asleep or having trouble staying asleep. This may lead to other symptoms, such as: Feeling tired or having low energy. Feeling nervous about going to sleep. Not feeling rested in the morning. Having trouble concentrating. Feeling irritable, anxious, or depressed. How is this diagnosed? This condition may be diagnosed based on: Your symptoms and medical history. Your health care provider may ask about: Your sleep habits. Any medical conditions you have. Your mental health. A physical exam. How is this treated? Treatment for insomnia depends on the cause. Treatment may focus on treating an underlying  condition that is causing the insomnia. Treatment may also include: Medicines to help you sleep. Counseling or therapy. Lifestyle adjustments to help you sleep better. Follow these instructions at home: Eating and drinking  Limit or avoid alcohol, caffeinated beverages, and products that contain nicotine and tobacco, especially close to bedtime. These can disrupt your sleep. Do not eat a large meal or eat spicy foods right before bedtime. This can lead to digestive discomfort that can make it hard for you to sleep. Sleep habits  Keep a sleep diary to help you and your health care provider figure out what could be causing your insomnia. Write down: When you sleep. When you wake up during the night. How well you sleep and how rested you feel the next day. Any side effects of medicines you are taking. What you eat and drink. Make your bedroom a dark, comfortable place where it is easy to fall asleep. Put up shades or blackout curtains to block light from outside. Use a white noise machine to block noise. Keep the temperature cool. Limit screen use before bedtime. This includes: Not watching TV. Not using your smartphone, tablet, or computer. Stick to a routine that includes going to bed and waking up at the same times every day and  night. This can help you fall asleep faster. Consider making a quiet activity, such as reading, part of your nighttime routine. Try to avoid taking naps during the day so that you sleep better at night. Get out of bed if you are still awake after 15 minutes of trying to sleep. Keep the lights down, but try reading or doing a quiet activity. When you feel sleepy, go back to bed. General instructions Take over-the-counter and prescription medicines only as told by your health care provider. Exercise regularly as told by your health care provider. However, avoid exercising in the hours right before bedtime. Use relaxation techniques to manage stress. Ask your  health care provider to suggest some techniques that may work well for you. These may include: Breathing exercises. Routines to release muscle tension. Visualizing peaceful scenes. Make sure that you drive carefully. Do not drive if you feel very sleepy. Keep all follow-up visits. This is important. Contact a health care provider if: You are tired throughout the day. You have trouble in your daily routine due to sleepiness. You continue to have sleep problems, or your sleep problems get worse. Get help right away if: You have thoughts about hurting yourself or someone else. Get help right away if you feel like you may hurt yourself or others, or have thoughts about taking your own life. Go to your nearest emergency room or: Call 911. Call the National Suicide Prevention Lifeline at 570-521-7893 or 988. This is open 24 hours a day. Text the Crisis Text Line at (203)732-1792. Summary Insomnia is a sleep disorder that makes it difficult to fall asleep or stay asleep. Insomnia can be long-term (chronic) or short-term (acute). Treatment for insomnia depends on the cause. Treatment may focus on treating an underlying condition that is causing the insomnia. Keep a sleep diary to help you and your health care provider figure out what could be causing your insomnia. This information is not intended to replace advice given to you by your health care provider. Make sure you discuss any questions you have with your health care provider. Document Revised: 05/27/2021 Document Reviewed: 05/27/2021 Elsevier Patient Education  2023 [employer]       Signed,   [name], MD [facility], [facility] 01/08/22 11:26 AM

## 2022-01-08 NOTE — Patient Instructions (Addendum)
Try melatonin over the counter and see info on sleep below. I would recommend meeting with a sleep specialist and home sleep testing - let me know when I can place that referral.   No other med changes today. Depending on cholesterol reading, I may recommend more doses per week of the Lipitor. I will let you know. Take care!  Insomnia Insomnia is a sleep disorder that makes it difficult to fall asleep or stay asleep. Insomnia can cause fatigue, low energy, difficulty concentrating, mood swings, and poor performance at work or school. There are three different ways to classify insomnia: Difficulty falling asleep. Difficulty staying asleep. Waking up too early in the morning. Any type of insomnia can be long-term (chronic) or short-term (acute). Both are common. Short-term insomnia usually lasts for 3 months or less. Chronic insomnia occurs at least three times a week for longer than 3 months. What are the causes? Insomnia may be caused by another condition, situation, or substance, such as: Having certain mental health conditions, such as anxiety and depression. Using caffeine, alcohol, tobacco, or drugs. Having gastrointestinal conditions, such as gastroesophageal reflux disease (GERD). Having certain medical conditions. These include: Asthma. Alzheimer's disease. Stroke. Chronic pain. An overactive thyroid gland (hyperthyroidism). Other sleep disorders, such as restless legs syndrome and sleep apnea. Menopause. Sometimes, the cause of insomnia may not be known. What increases the risk? Risk factors for insomnia include: Gender. Females are affected more often than males. Age. Insomnia is more common as people get older. Stress and certain medical and mental health conditions. Lack of exercise. Having an irregular work schedule. This may include working night shifts and traveling between different time zones. What are the signs or symptoms? If you have insomnia, the main symptom is  having trouble falling asleep or having trouble staying asleep. This may lead to other symptoms, such as: Feeling tired or having low energy. Feeling nervous about going to sleep. Not feeling rested in the morning. Having trouble concentrating. Feeling irritable, anxious, or depressed. How is this diagnosed? This condition may be diagnosed based on: Your symptoms and medical history. Your health care provider may ask about: Your sleep habits. Any medical conditions you have. Your mental health. A physical exam. How is this treated? Treatment for insomnia depends on the cause. Treatment may focus on treating an underlying condition that is causing the insomnia. Treatment may also include: Medicines to help you sleep. Counseling or therapy. Lifestyle adjustments to help you sleep better. Follow these instructions at home: Eating and drinking  Limit or avoid alcohol, caffeinated beverages, and products that contain nicotine and tobacco, especially close to bedtime. These can disrupt your sleep. Do not eat a large meal or eat spicy foods right before bedtime. This can lead to digestive discomfort that can make it hard for you to sleep. Sleep habits  Keep a sleep diary to help you and your health care provider figure out what could be causing your insomnia. Write down: When you sleep. When you wake up during the night. How well you sleep and how rested you feel the next day. Any side effects of medicines you are taking. What you eat and drink. Make your bedroom a dark, comfortable place where it is easy to fall asleep. Put up shades or blackout curtains to block light from outside. Use a white noise machine to block noise. Keep the temperature cool. Limit screen use before bedtime. This includes: Not watching TV. Not using your smartphone, tablet, or computer. Stick to a  routine that includes going to bed and waking up at the same times every day and night. This can help you fall  asleep faster. Consider making a quiet activity, such as reading, part of your nighttime routine. Try to avoid taking naps during the day so that you sleep better at night. Get out of bed if you are still awake after 15 minutes of trying to sleep. Keep the lights down, but try reading or doing a quiet activity. When you feel sleepy, go back to bed. General instructions Take over-the-counter and prescription medicines only as told by your health care provider. Exercise regularly as told by your health care provider. However, avoid exercising in the hours right before bedtime. Use relaxation techniques to manage stress. Ask your health care provider to suggest some techniques that may work well for you. These may include: Breathing exercises. Routines to release muscle tension. Visualizing peaceful scenes. Make sure that you drive carefully. Do not drive if you feel very sleepy. Keep all follow-up visits. This is important. Contact a health care provider if: You are tired throughout the day. You have trouble in your daily routine due to sleepiness. You continue to have sleep problems, or your sleep problems get worse. Get help right away if: You have thoughts about hurting yourself or someone else. Get help right away if you feel like you may hurt yourself or others, or have thoughts about taking your own life. Go to your nearest emergency room or: Call 911. Call the National Suicide Prevention Lifeline at 5743221868 or 988. This is open 24 hours a day. Text the Crisis Text Line at 416-753-0039. Summary Insomnia is a sleep disorder that makes it difficult to fall asleep or stay asleep. Insomnia can be long-term (chronic) or short-term (acute). Treatment for insomnia depends on the cause. Treatment may focus on treating an underlying condition that is causing the insomnia. Keep a sleep diary to help you and your health care provider figure out what could be causing your insomnia. This  information is not intended to replace advice given to you by your health care provider. Make sure you discuss any questions you have with your health care provider. Document Revised: 05/27/2021 Document Reviewed: 05/27/2021 Elsevier Patient Education  2023 [employer].

## 2022-01-28 ENCOUNTER — Encounter: Payer: Self-pay | Admitting: Family Medicine

## 2022-01-28 DIAGNOSIS — Z87892 Personal history of anaphylaxis: Secondary | ICD-10-CM

## 2022-01-28 DIAGNOSIS — Z91038 Other insect allergy status: Secondary | ICD-10-CM

## 2022-01-28 MED ORDER — EPINEPHRINE 0.3 MG/0.3ML IJ SOAJ: INTRAMUSCULAR | 0 refills | Status: AC

## 2022-05-19 ENCOUNTER — Ambulatory Visit
Admission: RE | Admit: 2022-05-19 | Discharge: 2022-05-19 | Disposition: A | Discharge status: Home / Self Care | Payer: 59 | Source: Ambulatory Visit | Attending: Family Medicine | Admitting: Family Medicine

## 2022-05-19 ENCOUNTER — Other Ambulatory Visit: Payer: Self-pay | Admitting: Family Medicine

## 2022-05-19 ENCOUNTER — Ambulatory Visit: Admission: RE | Admit: 2022-05-19 | Payer: 59 | Source: Ambulatory Visit

## 2022-05-19 DIAGNOSIS — N6489 Other specified disorders of breast: Secondary | ICD-10-CM

## 2022-06-11 ENCOUNTER — Encounter: Payer: Self-pay | Admitting: Family Medicine

## 2022-06-11 ENCOUNTER — Ambulatory Visit (INDEPENDENT_AMBULATORY_CARE_PROVIDER_SITE_OTHER): Payer: 59 | Admitting: Family Medicine

## 2022-06-11 VITALS — BP 128/78 | HR 73 | Temp 98.2°F | Ht 65.0 in | Wt 202.8 lb

## 2022-06-11 DIAGNOSIS — F411 Generalized anxiety disorder: Secondary | ICD-10-CM | POA: Diagnosis not present

## 2022-06-11 DIAGNOSIS — E785 Hyperlipidemia, unspecified: Secondary | ICD-10-CM

## 2022-06-11 MED ORDER — BUPROPION HCL ER (SR) 150 MG PO TB12: 150.0000 mg | ORAL_TABLET | Freq: Two times a day (BID) | ORAL | 2 refills | Status: AC

## 2022-06-11 MED ORDER — ESCITALOPRAM OXALATE 20 MG PO TABS: 20.0000 mg | ORAL_TABLET | Freq: Every day | ORAL | 2 refills | Status: AC

## 2022-06-11 MED ORDER — ROSUVASTATIN CALCIUM 10 MG PO TABS: 10.0000 mg | ORAL_TABLET | Freq: Every day | ORAL | 2 refills | Status: AC

## 2022-06-11 NOTE — Progress Notes (Signed)
Subjective:  Patient ID: Ann Sampson, female    DOB: 1976/05/12  Age: 46 y.o. MRN: [id number]  CC:  Chief Complaint  Patient presents with   Hyperlipidemia    Pt only had a yogurt to eat    Anxiety    Pt states all is well GAD - 2    HPI Ann Sampson presents for   Hyperlipidemia: Last discussed in July, Lipitor 4 times per week dosing at that time.  Still significant elevated LDL at 195.  Daily dosing recommended with repeat testing. Taking daily - some nausea. No abd pain, no vomiting.. Lab Results  Component Value Date   CHOL 293 (H) 01/08/2022   HDL 70.80 01/08/2022   LDLCALC 195 (H) 01/08/2022   TRIG 134.0 01/08/2022   CHOLHDL 4 01/08/2022   Lab Results  Component Value Date   ALT 16 01/08/2022   AST 23 01/08/2022   ALKPHOS 57 01/08/2022   BILITOT 0.3 01/08/2022   Generalized anxiety disorder Treated with Lexapro, Wellbutrin, stable at her last visit discussed insomnia at that time with possible anxiety, psychophysiologic insomnia versus sleep apnea with snoring and nocturnal wakening.  Initial trial of melatonin option, handout on insomnia, option to meet with sleep specialist. Sleep has been doing better. Anxiety stable with lexapro and wellbutrin. No new side effects. Feeling better overall.  Promotion at work. More responsibility, but enjoying her work. 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  Met with online provider for mental health - occasional meetings.  No recent need for xanax.  Naltrexone for weight loss for cravings.     06/11/2022    3:31 PM 01/08/2022   10:44 AM 07/10/2021    8:28 AM 01/03/2021    2:25 PM 07/06/2020   10:14 AM  Depression screen PHQ 2/9  Decreased Interest 0 0 0 0 0  Down, Depressed, Hopeless 0 0 0 0 0  PHQ - 2 Score 0 0 0 0 0  Altered sleeping _0 Tired, decreased energy 0 0 2 1   Change in appetite 1 0 0 1   Feeling bad or failure about yourself  0 0 0 0    Trouble concentrating 0 0 0 1   Moving slowly or fidgety/restless 0 0 0 0   Suicidal thoughts 0 0 0 0   PHQ-9 Score _1 Difficult doing work/chores  Not difficult at all         06/11/2022    3:35 PM 01/08/2022   10:44 AM 07/10/2021    9:06 AM 01/03/2021    2:26 PM  GAD 7 : Generalized Anxiety Score  Nervous, Anxious, on Edge 0 _2 Control/stop worrying 1 1 0 1  Worry too much - different things _3 Trouble relaxing 0 1 0 1  Restless 0 _4 Easily annoyed or irritable 0 0 0 1  Afraid - awful might happen 0 1 0 0  Total GAD 7 Score _5 Anxiety Difficulty  Somewhat difficult           History Patient Active Problem List   Diagnosis Date Noted   Lisfranc dislocation, left, sequela    Ankle syndesmosis disruption, left, sequela    Generalized anxiety disorder 11/03/2017   Neuropathy 11/04/2016   Paresthesias 11/04/2016   Past Medical History:  Diagnosis Date   Anxiety  Arthritis    Asthma    Complication of anesthesia    with endoscopic spoke profeaty   Depression    Family history of adverse reaction to anesthesia    sister- vommitting   Headache    magraines in the past   Neuropathy    feet mainly- hands some   Seasonal allergies    Past Surgical History:  Procedure Laterality Date   AUGMENTATION MAMMAPLASTY Bilateral    2006   BREAST SURGERY Bilateral    Breast Enhancement   ESOPHAGOGASTRODUODENOSCOPY     x 3  last one approx 2002   KNEE SURGERY Right    arthroscopy- cartilage   ORIF ANKLE FRACTURE Left 04/18/2020   Procedure: OPEN REDUCTION INTERNAL FIXATION (ORIF) SYNDESMOSIS LEFT ANKLE AND LISFRANC JOINT LEFT FOOT;  Surgeon: Newt Minion, MD;  Location: [facility];  Service: Orthopedics;  Laterality: Left;   Allergies  Allergen Reactions   Bee Venom Anaphylaxis   Hornet Venom Anaphylaxis   Latex Hives and Shortness Of Breath   Prednisone Anaphylaxis, Rash and Shortness Of Breath   Chap-Aid [Chapstick] Swelling    Swelling  of lips   Bextra [Valdecoxib] Rash   Flagyl [Metronidazole] Rash   Vioxx [Rofecoxib] Rash   Prior to Admission medications   Medication Sig Start Date End Date Taking? Authorizing Provider  atorvastatin (LIPITOR) 10 MG tablet Take 1 tablet (10 mg total) by mouth daily. 01/08/22  Yes Wendie Agreste, MD  buPROPion (WELLBUTRIN SR) 150 MG 12 hr tablet TAKE ONE TABLET BY MOUTH TWICE DAILY. 10/25/21  Yes Wendie Agreste, MD  EPINEPHrine 0.3 mg/0.3 mL IJ SOAJ injection USE AS DIRECTED 01/28/22  Yes Wendie Agreste, MD  escitalopram (LEXAPRO) 20 MG tablet Take 1 tablet (20 mg total) by mouth daily. 01/08/22  Yes Wendie Agreste, MD  Multiple Vitamins-Minerals (ADULT GUMMY PO) Take 2 capsules by mouth daily.    Yes [provider]  naltrexone (DEPADE) 50 MG tablet Take 25 mg by mouth daily. 12/21/21  Yes [provider]  ALPRAZolam (XANAX) 0.25 MG tablet Take 1 tablet (0.25 mg total) by mouth 2 (two) times daily as needed for anxiety. Patient not taking: Reported on 01/08/2022 07/06/20   Wendie Agreste, MD  clobetasol cream (TEMOVATE) 4.07 % Apply 1 application topically 2 (two) times daily. To affected lesions Patient not taking: Reported on 06/11/2022 07/04/20   Wendie Agreste, MD  triamcinolone cream (KENALOG) 0.1 % Apply 30 g topically 2 (two) times daily. Patient not taking: Reported on 06/11/2022 11/14/21   [provider]   Social History   Socioeconomic History   Marital status: Unknown    Spouse name: Not on file   Number of children: Not on file   Years of education: Not on file   Highest education level: Not on file  Occupational History   Not on file  Tobacco Use   Smoking status: Former   Smokeless tobacco: Never   Tobacco comments:    Apx 20 years plus light smoking social situations   Vaping Use   Vaping Use: Never used  Substance and Sexual Activity   Alcohol use: Yes    Alcohol/week: 1.0 standard drink of alcohol    Types: 1 Glasses of  wine per week    Comment: occcasionally   Drug use: No   Sexual activity: Not Currently    Birth control/protection: None  Other Topics Concern   Not on file  Social History Narrative  Not on file   Social Determinants of Health   Financial Resource Strain: Not on file  Food Insecurity: Not on file  Transportation Needs: Not on file  Physical Activity: Not on file  Stress: Not on file  Social Connections: Not on file  Intimate Partner Violence: Not on file    Review of Systems  Constitutional:  Negative for fatigue and unexpected weight change.  Respiratory:  Negative for chest tightness and shortness of breath.   Cardiovascular:  Negative for chest pain, palpitations and leg swelling.  Gastrointestinal:  Negative for abdominal pain and blood in stool.  Neurological:  Negative for dizziness, syncope, light-headedness and headaches.     Objective:   Vitals:   06/11/22 1537  BP: 128/78  Pulse: 73  Temp: 98.2 F (36.8 C)  SpO2: 100%  Weight: 202 lb 12.8 oz (92 kg)  Height: _0  (1.651 m)     Physical Exam Vitals reviewed.  Constitutional:      Appearance: Normal appearance. She is well-developed.  HENT:     Head: Normocephalic and atraumatic.  Eyes:     Conjunctiva/sclera: Conjunctivae normal.     Pupils: Pupils are equal, round, and reactive to light.  Neck:     Vascular: No carotid bruit.  Cardiovascular:     Rate and Rhythm: Normal rate and regular rhythm.     Heart sounds: Normal heart sounds.  Pulmonary:     Effort: Pulmonary effort is normal.     Breath sounds: Normal breath sounds.  Abdominal:     Palpations: Abdomen is soft. There is no pulsatile mass.     Tenderness: There is no abdominal tenderness.  Musculoskeletal:     Right lower leg: No edema.     Left lower leg: No edema.  Skin:    General: Skin is warm and dry.  Neurological:     Mental Status: She is alert and oriented to person, place, and time.  Psychiatric:        Mood and  Affect: Mood normal.        Behavior: Behavior normal.        Thought Content: Thought content normal.        Assessment & Plan:  Ann Sampson is a 46 y.o. female . Hyperlipidemia, unspecified hyperlipidemia type - Plan: Comprehensive metabolic panel, Lipid panel Plan: rosuvastatin (CRESTOR) 10 MG   -Reports some nausea without vomiting or abdominal pain, attributes to Lipitor.  Will try switching to Crestor as may need more potent statin anyway given previous LDL.  Check updated labs, RTC precautions if persistent nausea or new side effects on Crestor.  Generalized anxiety disorder - tablet, buPROPion (WELLBUTRIN SR) 150 MG 12 hr tablet, escitalopram (LEXAPRO) 20 MG tablet  -Stable on current regimen, continue Wellbutrin, Lexapro same doses.  Meds ordered this encounter  Medications   rosuvastatin (CRESTOR) 10 MG tablet    Sig: Take 1 tablet (10 mg total) by mouth daily.    Dispense:  90 tablet    Refill:  2   buPROPion (WELLBUTRIN SR) 150 MG 12 hr tablet    Sig: Take 1 tablet (150 mg total) by mouth 2 (two) times daily.    Dispense:  180 tablet    Refill:  2   escitalopram (LEXAPRO) 20 MG tablet    Sig: Take 1 tablet (20 mg total) by mouth daily.    Dispense:  90 tablet    Refill:  2   Patient Instructions  Try crestor in  place of lipitor - if nausea persists, let me know. This may help cholesterol numbers better than the lipitor.   No other med changes today.   Labs in next week: [facility] in 8:30-4:30 during weekdays, no appointment needed [place].  [place], [place] [address]     Signed,   Merri Ray, MD [place] 06/11/22 4:20 PM

## 2022-06-11 NOTE — Patient Instructions (Addendum)
Try crestor in place of lipitor - if nausea persists, let me know. This may help cholesterol numbers better than the lipitor.   No other med changes today.   Labs in next week: [facility] in 8:30-4:30 during weekdays, no appointment needed [address]  [place], [place] [address]

## 2022-11-03 ENCOUNTER — Ambulatory Visit
Admission: RE | Admit: 2022-11-03 | Discharge: 2022-11-03 | Disposition: A | Discharge status: Home / Self Care | Payer: 59 | Source: Ambulatory Visit | Attending: Family Medicine | Admitting: Family Medicine

## 2022-11-03 DIAGNOSIS — N6489 Other specified disorders of breast: Secondary | ICD-10-CM

## 2022-12-11 ENCOUNTER — Ambulatory Visit: Payer: 59 | Admitting: Family Medicine

## 2023-02-12 IMAGING — US US BREAST*R* LIMITED INC AXILLA
1 series · 7 of 7 positions shown · non-contrast
Comparison: Previous exam(s).

CLINICAL DATA: The patient was called back for a right breast
asymmetry

EXAM:
DIGITAL DIAGNOSTIC UNILATERAL RIGHT MAMMOGRAM WITH IMPLANTS, CAD AND
TOMOSYNTHESIS; ULTRASOUND RIGHT BREAST LIMITED
TECHNIQUE: Right digital diagnostic mammography and breast tomosynthesis was
performed. The images were evaluated with computer-aided detection.
Standard and/or implant displaced views were performed.; Targeted
ultrasound examination of the right breast was performed

[Series 1: us breast*right* limited inc axilla · 0.08mm/px · 7 of 7 slices shown]
[im 1/7]
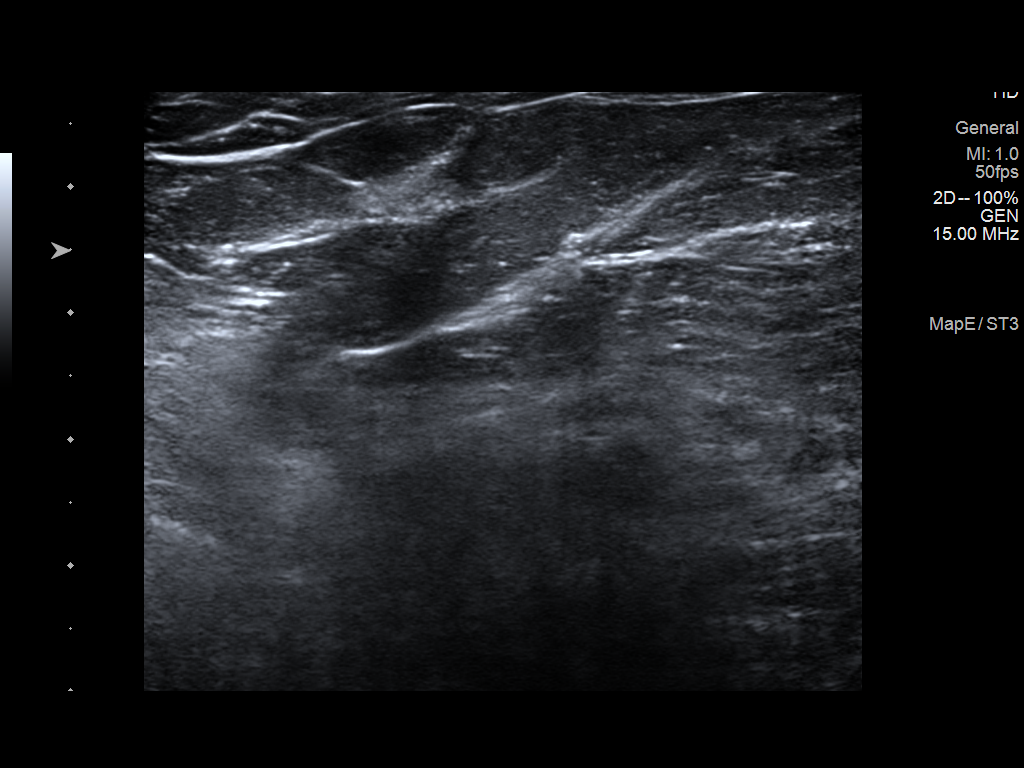
[im 2/7]
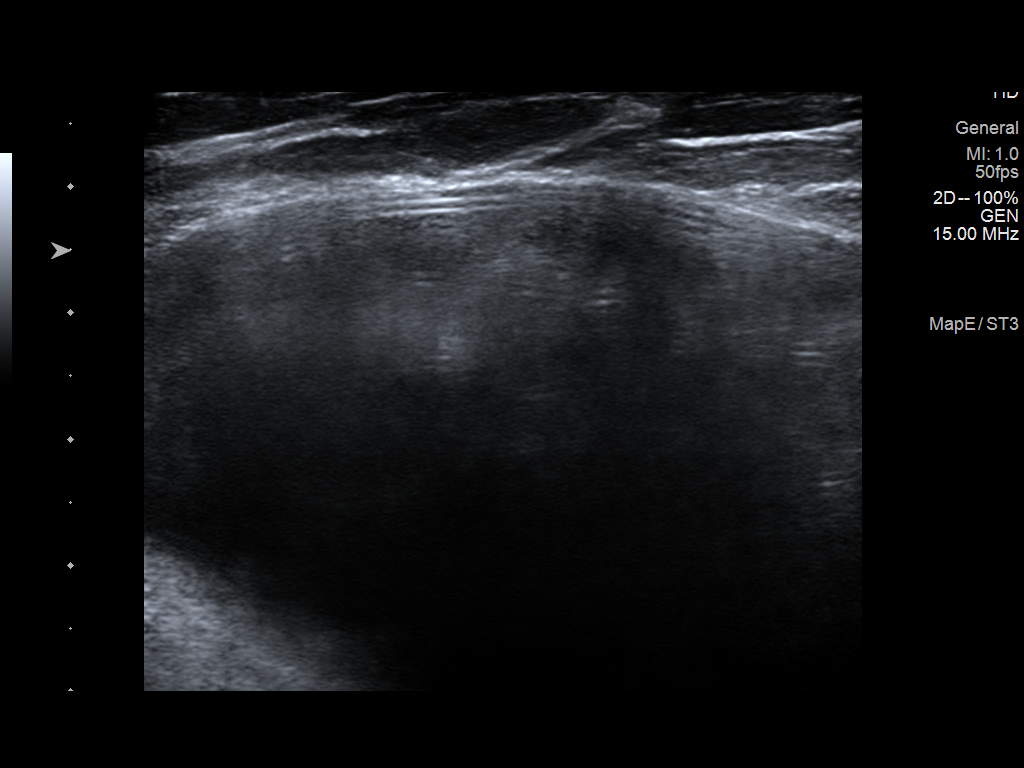
[im 3/7]
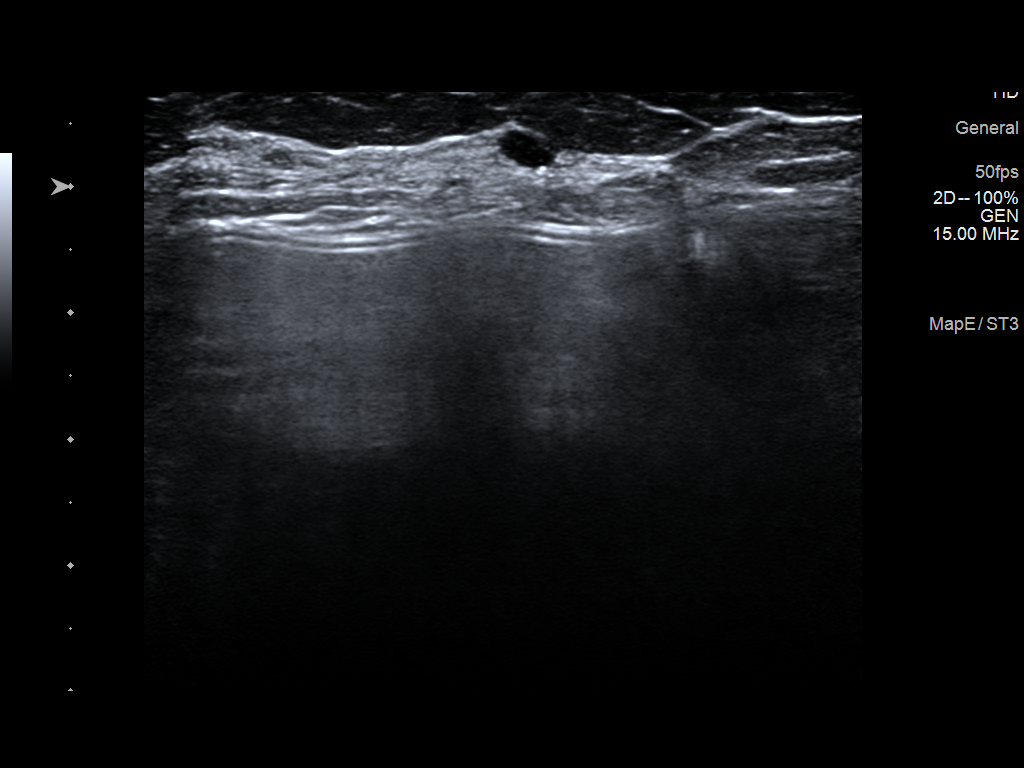
[im 4/7]
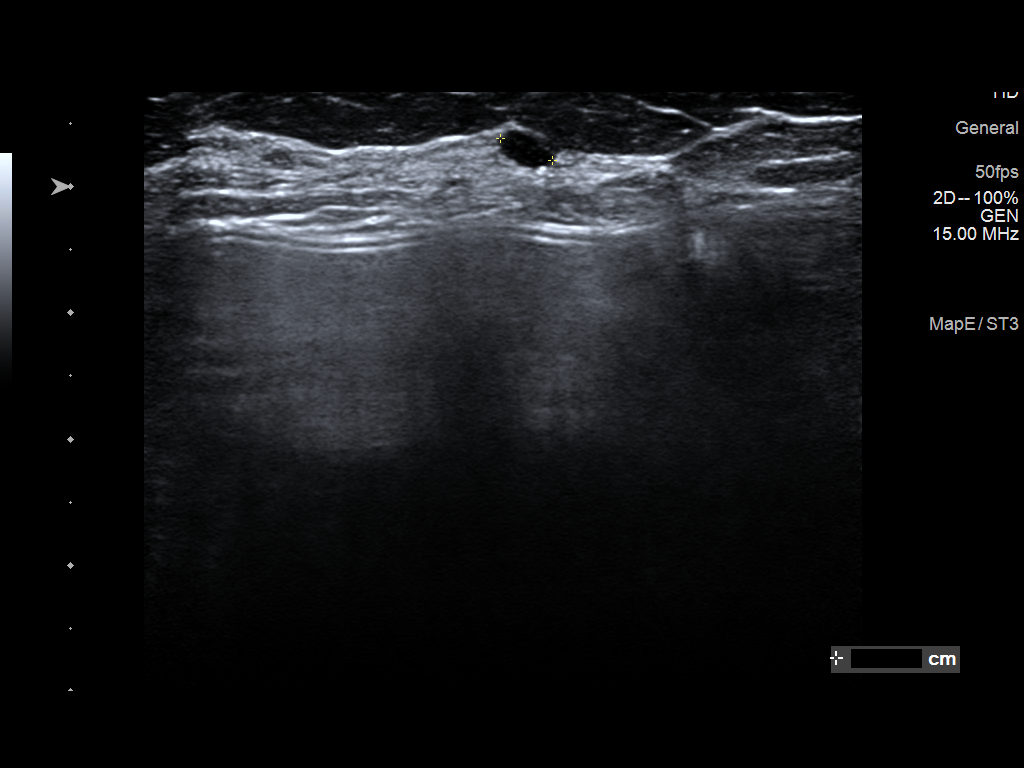
[im 5/7]
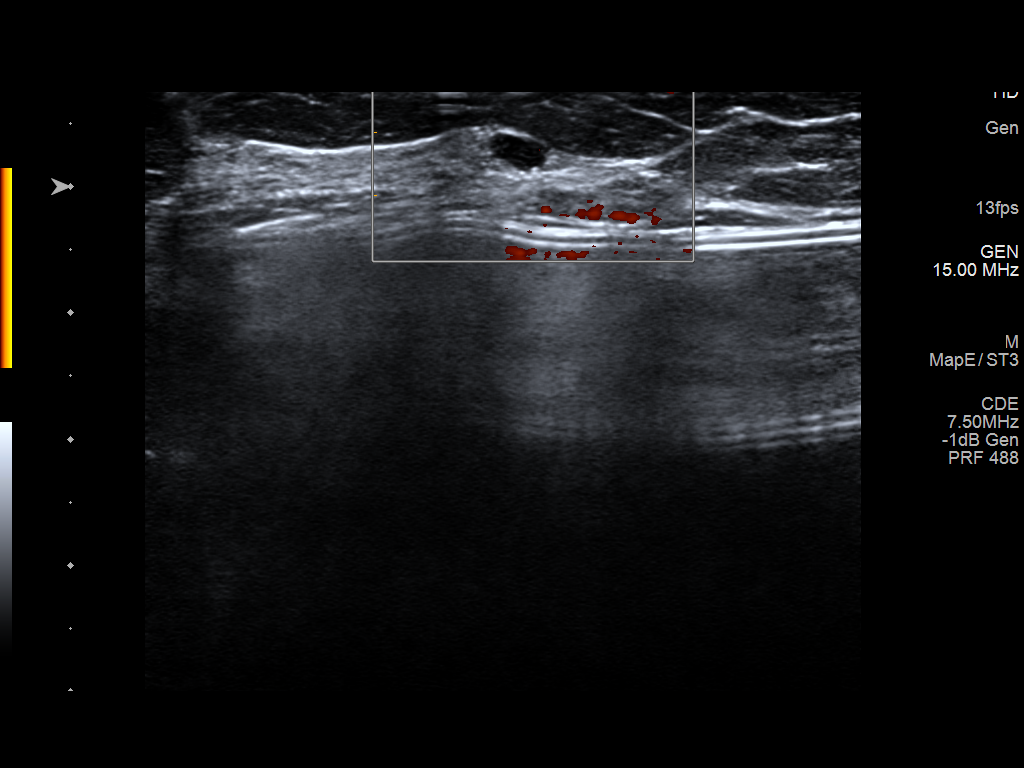
[im 6/7]
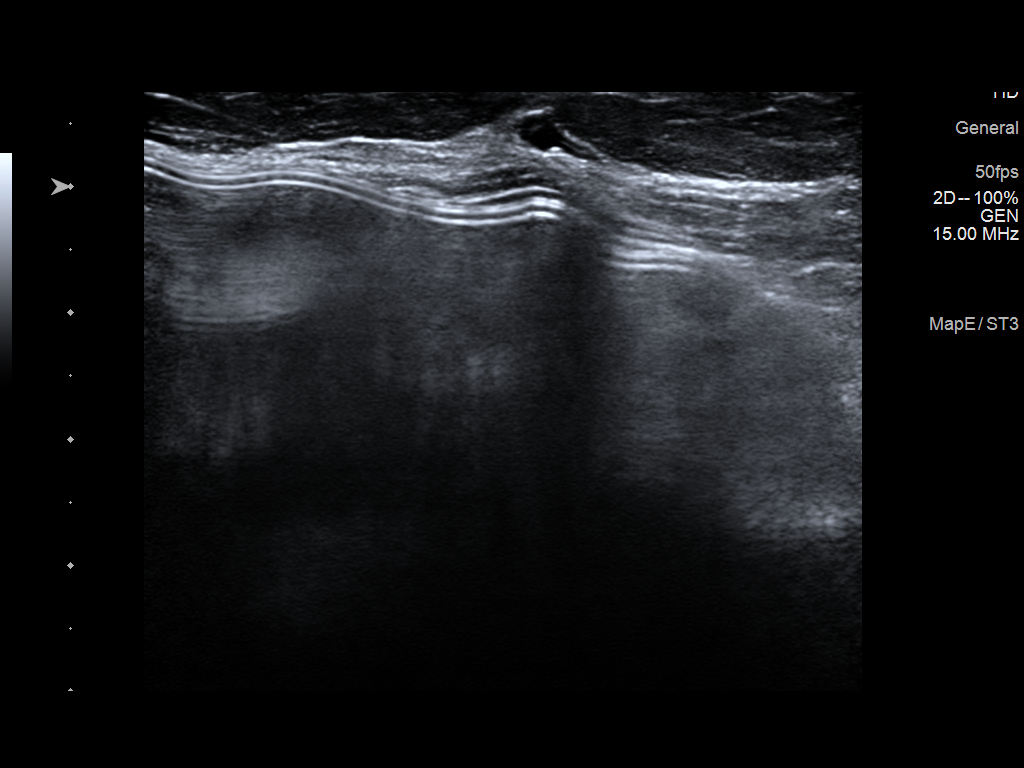
[im 7/7]
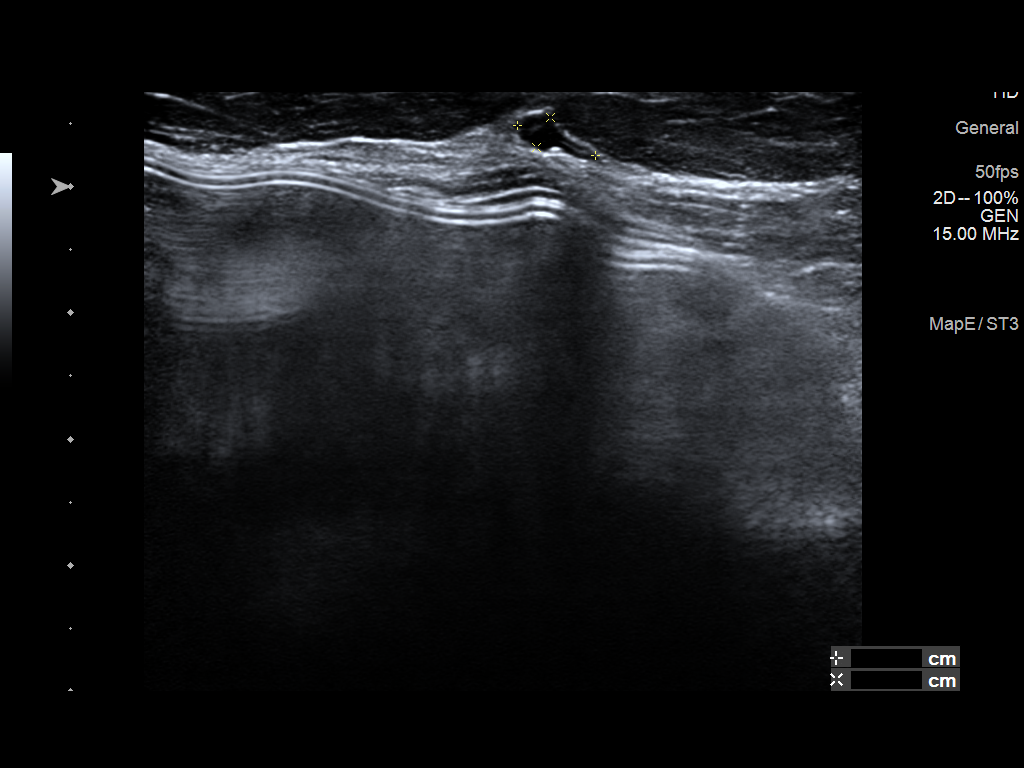

[7 of 7 positions shown; findings below may reference images not displayed]

ACR Breast Density Category b: There are scattered areas of
fibroglandular density.
FINDINGS: The right breast asymmetry improves does completely resolve on
today's imaging. The asymmetry may simply represent an island of
tissue obscured by the implant on the 1 previous comparison study.
The patient has retropectoral implants.

On physical exam, no suspicious lumps are identified.

Targeted ultrasound is performed, showing a simple cyst incidentally
identified in the right breast at 7 o'clock. No suspicious masses.
IMPRESSION: Persistent but improved right breast asymmetry.

RECOMMENDATION:
Recommend six-month follow-up mammogram of the persistent but
improved right breast asymmetry.

I have discussed the findings and recommendations with the patient.
If applicable, a reminder letter will be sent to the patient
regarding the next appointment.

BI-RADS CATEGORY  3: Probably benign.

## 2023-04-27 ENCOUNTER — Encounter: Payer: Self-pay | Admitting: Family Medicine

## 2024-06-01 ENCOUNTER — Other Ambulatory Visit: Payer: Self-pay | Admitting: Obstetrics and Gynecology

## 2024-06-01 DIAGNOSIS — Z1231 Encounter for screening mammogram for malignant neoplasm of breast: Secondary | ICD-10-CM

## 2024-06-29 ENCOUNTER — Ambulatory Visit
Admission: RE | Admit: 2024-06-29 | Discharge: 2024-06-29 | Disposition: A | Discharge status: Home / Self Care | Source: Ambulatory Visit | Attending: Obstetrics and Gynecology | Admitting: Obstetrics and Gynecology

## 2024-06-29 DIAGNOSIS — Z1231 Encounter for screening mammogram for malignant neoplasm of breast: Secondary | ICD-10-CM

## 2024-07-19 NOTE — Addendum Note (Signed)
 Encounter addended by: Wenceslao Sos, RT on: 07/19/2024 9:02 AM  Actions taken: Imaging Exam ended, Image imported
